# Patient Record
Sex: Female | Born: 1975 | Race: White | Hispanic: No | Smoking: Never smoker
Health system: Southern US, Community
[De-identification: ages and names within clinical notes are randomized; demographics above are authoritative.]

## PROBLEM LIST (undated history)

## (undated) DIAGNOSIS — I672 Cerebral atherosclerosis: Secondary | ICD-10-CM

## (undated) DIAGNOSIS — Z973 Presence of spectacles and contact lenses: Secondary | ICD-10-CM

## (undated) DIAGNOSIS — H40033 Anatomical narrow angle, bilateral: Secondary | ICD-10-CM

## (undated) DIAGNOSIS — O1420 HELLP syndrome (HELLP), unspecified trimester: Secondary | ICD-10-CM

## (undated) DIAGNOSIS — H04123 Dry eye syndrome of bilateral lacrimal glands: Secondary | ICD-10-CM

## (undated) DIAGNOSIS — Z8489 Family history of other specified conditions: Secondary | ICD-10-CM

## (undated) DIAGNOSIS — K219 Gastro-esophageal reflux disease without esophagitis: Secondary | ICD-10-CM

## (undated) DIAGNOSIS — D509 Iron deficiency anemia, unspecified: Secondary | ICD-10-CM

## (undated) DIAGNOSIS — Z8614 Personal history of Methicillin resistant Staphylococcus aureus infection: Secondary | ICD-10-CM

## (undated) DIAGNOSIS — N920 Excessive and frequent menstruation with regular cycle: Secondary | ICD-10-CM

## (undated) DIAGNOSIS — I340 Nonrheumatic mitral (valve) insufficiency: Secondary | ICD-10-CM

## (undated) DIAGNOSIS — Z8759 Personal history of other complications of pregnancy, childbirth and the puerperium: Secondary | ICD-10-CM

## (undated) DIAGNOSIS — E042 Nontoxic multinodular goiter: Secondary | ICD-10-CM

## (undated) DIAGNOSIS — R002 Palpitations: Secondary | ICD-10-CM

## (undated) DIAGNOSIS — I10 Essential (primary) hypertension: Secondary | ICD-10-CM

## (undated) DIAGNOSIS — T8859XA Other complications of anesthesia, initial encounter: Secondary | ICD-10-CM

## (undated) DIAGNOSIS — M1990 Unspecified osteoarthritis, unspecified site: Secondary | ICD-10-CM

## (undated) HISTORY — DX: HELLP syndrome (HELLP), unspecified trimester: O14.20

## (undated) HISTORY — PX: TONSILLECTOMY AND ADENOIDECTOMY: SUR1326

## (undated) HISTORY — PX: DILATION AND CURETTAGE OF UTERUS: SHX78

---
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
## 1997-10-29 ENCOUNTER — Ambulatory Visit (HOSPITAL_COMMUNITY): Admission: RE | Admit: 1997-10-29 | Discharge: 1997-10-29 | Payer: Self-pay | Admitting: Dermatology

## 1998-05-07 ENCOUNTER — Ambulatory Visit (HOSPITAL_COMMUNITY): Admission: RE | Admit: 1998-05-07 | Discharge: 1998-05-07 | Payer: Self-pay | Admitting: Dermatology

## 1998-06-04 ENCOUNTER — Ambulatory Visit (HOSPITAL_COMMUNITY): Admission: RE | Admit: 1998-06-04 | Discharge: 1998-06-04 | Payer: Self-pay | Admitting: Dermatology

## 1998-07-16 ENCOUNTER — Ambulatory Visit (HOSPITAL_COMMUNITY): Admission: RE | Admit: 1998-07-16 | Discharge: 1998-07-16 | Payer: Self-pay | Admitting: Dermatology

## 1998-08-26 ENCOUNTER — Ambulatory Visit (HOSPITAL_COMMUNITY): Admission: RE | Admit: 1998-08-26 | Discharge: 1998-08-26 | Payer: Self-pay | Admitting: Dermatology

## 1998-09-10 ENCOUNTER — Ambulatory Visit (HOSPITAL_COMMUNITY): Admission: RE | Admit: 1998-09-10 | Discharge: 1998-09-10 | Payer: Self-pay | Admitting: Dermatology

## 1998-10-22 ENCOUNTER — Ambulatory Visit (HOSPITAL_COMMUNITY): Admission: RE | Admit: 1998-10-22 | Discharge: 1998-10-22 | Payer: Self-pay | Admitting: Dermatology

## 2008-09-06 HISTORY — PX: DILATION AND CURETTAGE OF UTERUS: SHX78

## 2008-09-10 ENCOUNTER — Emergency Department (HOSPITAL_COMMUNITY): Admission: EM | Admit: 2008-09-10 | Discharge: 2008-09-11 | Payer: Self-pay | Admitting: Emergency Medicine

## 2010-10-19 ENCOUNTER — Encounter (INDEPENDENT_AMBULATORY_CARE_PROVIDER_SITE_OTHER): Payer: Self-pay | Admitting: *Deleted

## 2010-10-19 DIAGNOSIS — F341 Dysthymic disorder: Secondary | ICD-10-CM | POA: Insufficient documentation

## 2010-10-19 DIAGNOSIS — A4902 Methicillin resistant Staphylococcus aureus infection, unspecified site: Secondary | ICD-10-CM | POA: Insufficient documentation

## 2010-10-24 NOTE — Miscellaneous (Signed)
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) Added new problem of ANXIETY DEPRESSION (ICD-300.4) Medications: Added new medication of LABETALOL HCL 300 MG TABS (LABETALOL HCL) Take 1 tablet by mouth two times a day Added new medication of VALTREX 500 MG TABS (VALACYCLOVIR HCL) Take 1 tablet by mouth once a day Added new medication of XOPENEX HFA 45 MCG/ACT AERO (LEVALBUTEROL TARTRATE) as directed Added new medication of FLONASE 50 MCG/ACT SUSP (FLUTICASONE PROPIONATE) as directed Added new medication of SINGULAIR 10 MG TABS (MONTELUKAST SODIUM) as directed Allergies: Added new allergy or adverse reaction of ASA Added new allergy or adverse reaction of * EMYCIN Added new allergy or adverse reaction of * ZPACK Added new allergy or adverse reaction of MACROBID Observations: Added new observation of NKA: F (10/19/2010 16:09)

## 2010-11-22 ENCOUNTER — Encounter: Payer: Self-pay | Admitting: Infectious Diseases

## 2010-11-22 ENCOUNTER — Ambulatory Visit (INDEPENDENT_AMBULATORY_CARE_PROVIDER_SITE_OTHER): Payer: BC Managed Care – PPO | Admitting: Infectious Diseases

## 2010-11-22 VITALS — BP 145/87 | HR 81 | Temp 98.1°F | Ht 64.0 in | Wt 145.4 lb

## 2010-11-22 DIAGNOSIS — A4902 Methicillin resistant Staphylococcus aureus infection, unspecified site: Secondary | ICD-10-CM

## 2010-11-22 DIAGNOSIS — I10 Essential (primary) hypertension: Secondary | ICD-10-CM | POA: Insufficient documentation

## 2010-11-22 DIAGNOSIS — F341 Dysthymic disorder: Secondary | ICD-10-CM

## 2010-11-22 MED ORDER — MUPIROCIN 2 % EX OINT: TOPICAL_OINTMENT | CUTANEOUS | Status: AC

## 2010-11-22 MED ORDER — SULFAMETHOXAZOLE-TRIMETHOPRIM 800-160 MG PO TABS: 1.0000 | ORAL_TABLET | Freq: Two times a day (BID) | ORAL | Status: AC

## 2010-11-22 NOTE — Progress Notes (Signed)
  Subjective:    Patient ID: Caroline Peterson, female    DOB: 27-Oct-1975, 35 y.o.   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 Has not shaved since Jan and has not had recurrence since then. Has had them under her L and her R arms. Have manifest as boils. Boils go away after she takes anbx. Prev on septra. Has been on bactroban intranasally since Jan. Has had nasal cx and lesional cx which were both positive for MRSA.    Review of Systems Breast cancer in granmothers, pt has had prev mammo. Nl BM, nl urination, eating well. Wt steady. Using dial antibacterial soap. Has prev used hibiclens (last time within the last week). huband previously had lesions.     Objective:   Physical Exam  Constitutional: She appears well-developed and well-nourished.  Eyes: EOM are normal. Pupils are equal, round, and reactive to light.  Neck: Normal range of motion. Neck supple.  Cardiovascular: Normal rate, regular rhythm and normal heart sounds.   Pulmonary/Chest: Effort normal and breath sounds normal.  Abdominal: Soft. Bowel sounds are normal.  Musculoskeletal:       Arms:         Assessment & Plan:

## 2010-11-22 NOTE — Assessment & Plan Note (Signed)
She does not have any current lesions. We spoke at length regarding treatment options 1) use bactroban for the first 5 days of the month for the next 6 months. 2) use a good antibacterial soap. She is currently using dial antibactertial.  3) wash clothes, linens in hot water. Bleach would be helpful as well. 4) Bactrim as needed. 5) keep lesions covered.  6) hand washing at home.  It is possible that this is not only MRSA but also Hidradenitis Suppurtiva. If that is the case, will add topical clindamycin.  She will call back when she has a recurrence.

## 2010-11-27 LAB — CBC
HCT: 36.5 % (ref 36.0–46.0)
Hemoglobin: 12.2 g/dL (ref 12.0–15.0)
MCV: 91.2 fL (ref 78.0–100.0)
RBC: 4 MIL/uL (ref 3.87–5.11)
WBC: 7.1 10*3/uL (ref 4.0–10.5)

## 2010-11-27 LAB — URINE CULTURE

## 2010-11-27 LAB — POCT I-STAT, CHEM 8
BUN: 19 mg/dL (ref 6–23)
Calcium, Ion: 1.11 mmol/L — ABNORMAL LOW (ref 1.12–1.32)
Chloride: 104 mEq/L (ref 96–112)
Creatinine, Ser: 0.7 mg/dL (ref 0.4–1.2)
Glucose, Bld: 119 mg/dL — ABNORMAL HIGH (ref 70–99)

## 2010-11-27 LAB — DIFFERENTIAL
Eosinophils Absolute: 0.1 10*3/uL (ref 0.0–0.7)
Lymphs Abs: 1.8 10*3/uL (ref 0.7–4.0)
Monocytes Relative: 7 % (ref 3–12)
Neutrophils Relative %: 66 % (ref 43–77)

## 2010-11-27 LAB — URINALYSIS, ROUTINE W REFLEX MICROSCOPIC
Glucose, UA: NEGATIVE mg/dL
Protein, ur: NEGATIVE mg/dL
Urobilinogen, UA: 0.2 mg/dL (ref 0.0–1.0)

## 2010-11-27 LAB — POCT CARDIAC MARKERS: Troponin i, poc: <0.05 ng/mL (ref 0.00–0.09)

## 2010-11-27 LAB — PROTIME-INR: Prothrombin Time: 12.8 seconds (ref 11.6–15.2)

## 2011-10-16 ENCOUNTER — Other Ambulatory Visit: Payer: Self-pay | Admitting: Family Medicine

## 2011-10-16 MED ORDER — RANITIDINE HCL 150 MG PO TABS: 150.0000 mg | ORAL_TABLET | Freq: Two times a day (BID) | ORAL | Status: DC

## 2012-03-07 ENCOUNTER — Emergency Department (HOSPITAL_COMMUNITY)
Admission: EM | Admit: 2012-03-07 | Discharge: 2012-03-07 | Disposition: A | Discharge status: Home / Self Care | Payer: BC Managed Care – PPO | Attending: Emergency Medicine | Admitting: Emergency Medicine

## 2012-03-07 ENCOUNTER — Emergency Department (HOSPITAL_COMMUNITY): Payer: BC Managed Care – PPO

## 2012-03-07 ENCOUNTER — Encounter (HOSPITAL_COMMUNITY): Payer: Self-pay | Admitting: Cardiology

## 2012-03-07 DIAGNOSIS — M4120 Other idiopathic scoliosis, site unspecified: Secondary | ICD-10-CM | POA: Insufficient documentation

## 2012-03-07 DIAGNOSIS — R002 Palpitations: Secondary | ICD-10-CM

## 2012-03-07 DIAGNOSIS — I10 Essential (primary) hypertension: Secondary | ICD-10-CM | POA: Insufficient documentation

## 2012-03-07 HISTORY — DX: Essential (primary) hypertension: I10

## 2012-03-07 LAB — URINALYSIS, ROUTINE W REFLEX MICROSCOPIC
Bilirubin Urine: NEGATIVE
Hgb urine dipstick: NEGATIVE
Specific Gravity, Urine: 1.009 (ref 1.005–1.030)
Urobilinogen, UA: 0.2 mg/dL (ref 0.0–1.0)
pH: 6.5 (ref 5.0–8.0)

## 2012-03-07 LAB — BASIC METABOLIC PANEL
BUN: 10 mg/dL (ref 6–23)
CO2: 25 mEq/L (ref 19–32)
Chloride: 108 mEq/L (ref 96–112)
Creatinine, Ser: 0.55 mg/dL (ref 0.50–1.10)
Potassium: 3.7 mEq/L (ref 3.5–5.1)

## 2012-03-07 LAB — CBC WITH DIFFERENTIAL/PLATELET
HCT: 35.7 % — ABNORMAL LOW (ref 36.0–46.0)
Hemoglobin: 12.3 g/dL (ref 12.0–15.0)
Lymphocytes Relative: 21 % (ref 12–46)
Monocytes Absolute: 0.4 10*3/uL (ref 0.1–1.0)
Monocytes Relative: 7 % (ref 3–12)
Neutro Abs: 4.1 10*3/uL (ref 1.7–7.7)
RBC: 4.11 MIL/uL (ref 3.87–5.11)
WBC: 5.7 10*3/uL (ref 4.0–10.5)

## 2012-03-07 LAB — POCT PREGNANCY, URINE: Preg Test, Ur: NEGATIVE

## 2012-03-07 LAB — TROPONIN I: Troponin I: <0.3 ng/mL (ref <0.30)

## 2012-03-07 NOTE — ED Notes (Signed)
Pt reports that she had some tightness in her jaw last night and woke up this morning and felt sweaty and was having heart palpations. States that she does not see a cardiologist but feels like she needs to follow up with one. Reports pains under her left ribs.

## 2012-03-07 NOTE — ED Provider Notes (Signed)
History     CSN: [id number]  Arrival date & time 03/07/12  0908   First MD Initiated Contact with Patient 03/07/12 [phone]      Chief Complaint  Patient presents with  . Chest Pain    (Consider location/radiation/quality/duration/timing/severity/associated sxs/prior treatment) HPI Comments: Caroline Peterson is a 36 y.o. Female with intermittent palpitations, for 10 months, which occur at varying times during the day. She was referred to a nephrologist because of her hypertension, and that doctor prescribed Xanax for suspected anxiety, as cause of the symptoms. Last night, she noticed palpitations with chest discomfort, and sweating. She took a Xanax, and went to sleep. Today, she has had recurrence of palpitations, and intermittent left anterior chest discomfort. The pain is mild. There are no aggravating or palliative factors. She's been using her regular medicines, without relief. She has no primary care Dr. Geronimo Running never been formally evaluated for palpitations. She has no associated shortness of breath, back, pain, weakness, or dizziness. Her last menstrual period was 02/20/12. She is trying to get pregnant.  Patient is a 36 y.o. female presenting with chest pain. The history is provided by the patient.  Chest Pain     Past Medical History  Diagnosis Date  . Hypertension     History reviewed. No pertinent past surgical history.  Family History  Problem Relation Age of Onset  . Hypertension Mother   . Hyperlipidemia Mother   . Diabetes Father   . Hyperlipidemia Father   . Hypertension Brother     History  Substance Use Topics  . Smoking status: Never Smoker   . Smokeless tobacco: Never Used  . Alcohol Use: No    OB History    Grav Para Term Preterm Abortions TAB SAB Ect Mult Living                  Review of Systems  Cardiovascular: Positive for chest pain.  All other systems reviewed and are negative.    Allergies  Aspirin; Erythromycin; Nitrofurantoin; and  Azithromycin  Home Medications   Current Outpatient Rx  Name Route Sig Dispense Refill  . ALPRAZOLAM 0.25 MG PO TABS Oral Take 0.25 mg by mouth 2 (two) times daily as needed. As needed for anxiety.    Marland Kitchen LOPERAMIDE HCL 2 MG PO CAPS Oral Take 2 mg by mouth once as needed. As needed for loose stool.    Marland Kitchen LORATADINE 10 MG PO TABS Oral Take 10 mg by mouth daily as needed. As needed for allergies.    Marland Kitchen METOPROLOL SUCCINATE ER 50 MG PO TB24 Oral Take 50 mg by mouth daily.      Marland Kitchen MUPIROCIN 2 % EX OINT Topical Apply 1 application topically as needed. As needed for nose sores.    Marland Kitchen OVER THE COUNTER MEDICATION Oral Take 1 capsule by mouth daily. Acidophilus with probiotics.    Marland Kitchen PRENATAL MULTIVITAMIN CH Oral Take 1 tablet by mouth daily.    Marland Kitchen RANITIDINE HCL 150 MG PO TABS Oral Take 1 tablet (150 mg total) by mouth 2 (two) times daily. 60 tablet 1  . VALACYCLOVIR HCL 500 MG PO TABS Oral Take 500 mg by mouth daily.        BP 128/80  Pulse 71  Temp 98 F (36.7 C) (Oral)  Resp 20  SpO2 100%  LMP 02/20/2012  Physical Exam  Nursing note and vitals reviewed. Constitutional: She is oriented to person, place, and time. She appears well-developed and well-nourished.  HENT:  Head: Normocephalic and atraumatic.  Eyes: Conjunctivae and EOM are normal. Pupils are equal, round, and reactive to light.  Neck: Normal range of motion and phonation normal. Neck supple.  Cardiovascular: Normal rate, regular rhythm and intact distal pulses.   Pulmonary/Chest: Effort normal and breath sounds normal. She exhibits no tenderness.  Abdominal: Soft. She exhibits no distension. There is no tenderness. There is no guarding.  Musculoskeletal: Normal range of motion.  Neurological: She is alert and oriented to person, place, and time. She has normal strength. She exhibits normal muscle tone.  Skin: Skin is warm and dry.  Psychiatric: Her behavior is normal. Judgment and thought content normal.       She appears somewhat  anxious    ED Course  Procedures (including critical care time)    Date: 03/07/2012  Rate: 81  Rhythm: normal sinus rhythm  QRS Axis: normal  Intervals: normal  ST/T Wave abnormalities: nonspecific T wave abnormality  Conduction Disutrbances:none  Narrative Interpretation:   Old EKG Reviewed: unchanged   Labs Reviewed  CBC WITH DIFFERENTIAL - Abnormal; Notable for the following:    HCT 35.7 (*)     All other components within normal limits  BASIC METABOLIC PANEL  TROPONIN I  URINALYSIS, ROUTINE W REFLEX MICROSCOPIC  POCT PREGNANCY, URINE   Dg Chest 2 View  03/07/2012  *RADIOLOGY REPORT*  Clinical Data: Palpitations  CHEST - 2 VIEW  Comparison: 09/11/2008  Findings: Heart size is normal.  No pleural effusion or edema. No airspace consolidation.  There is a scoliosis deformity affecting the thoracic and lumbar spine.  IMPRESSION:  1.  No acute cardiopulmonary abnormalities  Original Report Authenticated By: Rosealee Albee, M.D.     1. Palpitation       MDM  Nonspecific palpitations, without near syncope, or syncope. Doubt ACS, PE (PERC negative), pneumonia, or myocarditis. She is stable for discharge with outpatient management of palpitations.   Plan: Home Medications- usual; Home Treatments- rest, avoid caffeine; Recommended follow up- Cardiology 1-2 weeks for probable Holter monitor.        Flint Melter, MD 03/07/12 [phone]

## 2012-08-28 ENCOUNTER — Telehealth: Payer: Self-pay | Admitting: Cardiology

## 2012-08-28 NOTE — Telephone Encounter (Signed)
ROI Mailed To Pt Home Address  08/28/12/KM

## 2012-09-12 ENCOUNTER — Encounter: Payer: Self-pay | Admitting: Cardiology

## 2012-09-12 ENCOUNTER — Ambulatory Visit (INDEPENDENT_AMBULATORY_CARE_PROVIDER_SITE_OTHER): Payer: BC Managed Care – PPO | Admitting: Cardiology

## 2012-09-12 VITALS — BP 116/86 | HR 79 | Ht 64.0 in | Wt 138.0 lb

## 2012-09-12 DIAGNOSIS — R002 Palpitations: Secondary | ICD-10-CM

## 2012-09-12 NOTE — Patient Instructions (Addendum)
The current medical regimen is effective;  continue present plan and medications.  Follow up in 1 year with Dr Hochrein.  You will receive a letter in the mail 2 months before you are due.  Please call us when you receive this letter to schedule your follow up appointment.  

## 2012-09-12 NOTE — Progress Notes (Signed)
HPI The patient presents as a new patient evaluation. She has a history of tachycardia palpitations. She noticed this after her first full-term pregnancy. 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  Following the pregnancy she had significant hypertension and was treated with labetalol. She eventually saw a nephrologist for her hypertension and was switched from labetalol to metoprolol. She says that more recently she has had fewer palpitations. She will notice some occasionally. She describes skipped heartbeats. Of note at one point last year she had a panic attack as diagnosed in the emergency room. She had some chest discomfort with this and was referred to another cardiologist in town. She reports a 30 day event monitor. She reports having an echocardiogram but was told it was only mild mitral regurgitation which was thought to be normal. Her beta blocker dose was increased at that time. She says that she is active though she doesn't exercise. She works and takes care for her young child. With this she has no significant limitations. She denies any ongoing chest pressure, neck or arm discomfort. She has no PND or orthopnea.  Allergies  Allergen Reactions  . Aspirin Nausea And Vomiting    "can take coated aspirin"  . Erythromycin Nausea And Vomiting  . Nitrofurantoin Other (See Comments)    Unknown   . Azithromycin Nausea And Vomiting and Palpitations    "made my heart speed up"    Current Outpatient Prescriptions  Medication Sig Dispense Refill  . ALPRAZolam (XANAX) 0.25 MG tablet Take 0.25 mg by mouth 2 (two) times daily as needed. As needed for anxiety.      . Lactobacillus (ACIDOPHILUS PROBIOTIC) TABS Take 175 mg by mouth daily.      . metoprolol (TOPROL-XL) 50 MG 24 hr tablet Take 50 mg by mouth daily.        . mupirocin ointment (BACTROBAN) 2 % Apply 1 application topically as needed. As needed for nose sores.      Marland Kitchen OVER THE COUNTER MEDICATION Take 1 capsule by mouth daily.  Acidophilus with probiotics.      . Prenatal Vit-Fe Fumarate-FA (PRENATAL MULTIVITAMIN) TABS Take 1 tablet by mouth daily.      . ranitidine (ZANTAC) 150 MG tablet Take 1 tablet (150 mg total) by mouth 2 (two) times daily.  60 tablet  1  . valACYclovir (VALTREX) 500 MG tablet Take 500 mg by mouth daily.          Past Medical History  Diagnosis Date  . Hypertension   . HELLP (hemolytic anemia/elev liver enzymes/low platelets in pregnancy)     Past Surgical History  Procedure Date  . Cesarean section   . Tonsillectomy and adenoidectomy   . Dilation and curettage of uterus     Family History  Problem Relation Age of Onset  . Hypertension Mother   . Hyperlipidemia Mother   . Diabetes Father   . Hyperlipidemia Father   . Hypertension Brother     History   Social History  . Marital Status: Married    Spouse Name: N/A    Number of Children: 1  . Years of Education: N/A   Occupational History  .     Social History Main Topics  . Smoking status: Never Smoker   . Smokeless tobacco: Never Used  . Alcohol Use: No  . Drug Use: No  . Sexually Active: Yes    Birth Control/ Protection: Other-see comments   Other Topics Concern  . Not on file  Social History Narrative   Lives with husband and daughter.     ROS:  Positive for hearing loss, reflux. Otherwise as stated in the history of present illness and negative for all other systems. 09/12/2012  PHYSICAL EXAM BP 116/86  Pulse 79  Ht 5\' 4"  (1.626 m)  Wt 138 lb (62.596 kg)  BMI 23.69 kg/m2  SpO2 97% GENERAL:  Well appearing HEENT:  Pupils equal round and reactive, fundi not visualized, oral mucosa unremarkable NECK:  No jugular venous distention, waveform within normal limits, carotid upstroke brisk and symmetric, no bruits, no thyromegaly LYMPHATICS:  No cervical, inguinal adenopathy LUNGS:  Clear to auscultation bilaterally BACK:  No CVA tenderness CHEST:  Unremarkable HEART:  PMI not displaced or sustained,S1  and S2 within normal limits, no S3, no S4, no clicks, no rubs, no murmurs ABD:  Flat, positive bowel sounds normal in frequency in pitch, no bruits, no rebound, no guarding, no midline pulsatile mass, no hepatomegaly, no splenomegaly EXT:  2 plus pulses throughout, no edema, no cyanosis no clubbing SKIN:  No rashes no nodules NEURO:  Cranial nerves II through XII grossly intact, motor grossly intact throughout PSYCH:  Cognitively intact, oriented to person place and time  EKG:  Sinus rhythm, rate 79, axis within normal limits, intervals within normal limits, no acute ST-T wave changes.  09/12/2012   ASSESSMENT AND PLAN  Palpitations - We had a long discussion about this. She will continue with current beta blocker. Given the fact that they are not particularly symptomatic at this point no further cardiovascular testing is suggested. She will however let me know if they become worse in the future.  Mitral regurgitation - This was apparently mild by echo and she was told it was possibly a variant of normal. We discussed this physiology. I do not suspect anything clinically significant. I will follow this with repeat physical exams.

## 2012-09-15 ENCOUNTER — Telehealth: Payer: Self-pay | Admitting: Cardiology

## 2012-09-15 NOTE — Telephone Encounter (Signed)
Records rec From [facility] Gave to [place] 09/15/12/KM

## 2012-10-28 ENCOUNTER — Telehealth: Payer: Self-pay | Admitting: Cardiology

## 2012-10-28 DIAGNOSIS — I10 Essential (primary) hypertension: Secondary | ICD-10-CM

## 2012-10-28 MED ORDER — METOPROLOL SUCCINATE ER 50 MG PO TB24: 50.0000 mg | ORAL_TABLET | Freq: Every day | ORAL | Status: DC

## 2012-10-28 NOTE — Telephone Encounter (Signed)
Pt needs refill of metoprolol, [address] [facility]

## 2012-10-31 ENCOUNTER — Other Ambulatory Visit: Payer: Self-pay | Admitting: *Deleted

## 2012-10-31 NOTE — Telephone Encounter (Signed)
Returning call to patient about rx request. Our records indicate her Metoprolol (toprol xl) is 50mg  once daily. She said her last doctor had this medication at 50mg  twice daily. All of our records in Epic show Metoprolol Succ XL 50 mg once daily. Spoke with nurse and was advice to send rx to original Prescriber for refill since we do not have that rx request and it is not documented in our system. Called pharmacy to let them know and they agreed to send rx to original prescriber. Was unable to reach patient about this decision and was unable to leave message due to full mailbox. Pharmacy [facility]) said they will inform patient of this decision.   Micki Riley, CMA

## 2012-11-03 ENCOUNTER — Telehealth: Payer: Self-pay | Admitting: Cardiology

## 2012-11-03 DIAGNOSIS — I10 Essential (primary) hypertension: Secondary | ICD-10-CM

## 2012-11-03 MED ORDER — METOPROLOL SUCCINATE ER 50 MG PO TB24: 50.0000 mg | ORAL_TABLET | Freq: Two times a day (BID) | ORAL | Status: DC

## 2012-11-03 NOTE — Telephone Encounter (Signed)
plz return call to patient at wk # [phone] ext [phone] regarding issues with RX as written

## 2012-11-03 NOTE — Telephone Encounter (Signed)
Pt walked into office  Reports she ha been taking Metoprolol ER 50 mg BID for sometime now and is upset because we refilled the RX for once a day.  Explained to pt that we only have documented that she takes it once a day and will need to obtain an order from Dr Antoine Poche to change it to BID.  Dr Rinaldo Cloud originally rxed for twice a day.  Per Dr Antoine Poche OK to change RX to twice a day

## 2012-11-05 ENCOUNTER — Telehealth: Payer: Self-pay | Admitting: Cardiology

## 2012-11-05 DIAGNOSIS — R002 Palpitations: Secondary | ICD-10-CM

## 2012-11-05 NOTE — Telephone Encounter (Signed)
Per pt - she has been having episodes that had occurred for the past 3 weeks or so where her HR is elevated between 115 -120 bpm when she gets up in the AM.  Once she is ready to go to work and gets in the car her HR has returned to normal around 60 to 70.  She reports she is taking her Metoprolol ER 50 mg twice day about 12 hours apart.  BP has been between 120/80 to 116/60.  When her HR is elevated she feels hot, shakey and is sweating.  She is aware I will forward information to Dr Antoine Poche for review however he may want to see her in the office for further evaluation.  She is aware I will call her back with recommendations.

## 2012-11-05 NOTE — Telephone Encounter (Signed)
New Prob   Pt states when she wakes up in the morning her pulse is between 114-120 (c/o being hot, sweatiness, and shaky when pulse goes up) but then goes back down in the 70s when she starts to get her day started. Concerned and would like to speak to nurse.

## 2012-11-06 NOTE — Telephone Encounter (Signed)
Since I do not have a copy of the monitor that was placed by another cardiology group I would like to get a 48 hour holter to see what these am palpitations are.  She could then have follow up with me.

## 2012-11-07 ENCOUNTER — Telehealth: Payer: Self-pay | Admitting: *Deleted

## 2012-11-07 NOTE — Telephone Encounter (Signed)
Left message for pt that Dr Antoine Poche has ordered 48 hour monitor and she should except a call to have that scheduled along with a follow up appointment with Dr Antoine Poche.  Requested she call back if questions

## 2012-11-07 NOTE — Telephone Encounter (Signed)
Left message to call back  

## 2012-11-07 NOTE — Telephone Encounter (Signed)
Left message for Ms. Borner to schedule monitor/ov,

## 2012-11-12 ENCOUNTER — Encounter (INDEPENDENT_AMBULATORY_CARE_PROVIDER_SITE_OTHER): Payer: BC Managed Care – PPO

## 2012-11-12 ENCOUNTER — Telehealth: Payer: Self-pay | Admitting: *Deleted

## 2012-11-12 DIAGNOSIS — R002 Palpitations: Secondary | ICD-10-CM

## 2012-11-12 NOTE — Telephone Encounter (Signed)
48 hr holter monitor placed on Pt 11/12/12 TK

## 2012-11-17 ENCOUNTER — Ambulatory Visit: Payer: BC Managed Care – PPO | Admitting: Cardiology

## 2012-12-01 ENCOUNTER — Encounter: Payer: Self-pay | Admitting: *Deleted

## 2012-12-18 ENCOUNTER — Telehealth: Payer: Self-pay

## 2012-12-18 NOTE — Telephone Encounter (Signed)
Patient aware of monitor results and appointment was made for 02/18/16 @430  pm

## 2013-02-17 ENCOUNTER — Encounter: Payer: Self-pay | Admitting: Cardiology

## 2013-02-17 ENCOUNTER — Ambulatory Visit (INDEPENDENT_AMBULATORY_CARE_PROVIDER_SITE_OTHER): Payer: BC Managed Care – PPO | Admitting: Cardiology

## 2013-02-17 VITALS — BP 140/86 | HR 82 | Wt 136.0 lb

## 2013-02-17 DIAGNOSIS — I10 Essential (primary) hypertension: Secondary | ICD-10-CM

## 2013-02-17 DIAGNOSIS — R002 Palpitations: Secondary | ICD-10-CM

## 2013-02-17 NOTE — Patient Instructions (Addendum)
The current medical regimen is effective;  continue present plan and medications.  Follow up as needed 

## 2013-02-17 NOTE — Progress Notes (Signed)
   HPI The patient presents for followup of palpitations. She had this earlier this year and she wore a monitor. She had been previously as well.  The Holter monitor demonstrated very rare premature ectopy. She said she had some rapid sustained heart rates for was taking Flexeril at that time and stopped taking this. She said her symptoms that resolved. She has since felt well and in fact on vacation recently doesn't recall having any palpitations at all.  Allergies  Allergen Reactions  . Aspirin Nausea And Vomiting    "can take coated aspirin"  . Erythromycin Nausea And Vomiting  . Nitrofurantoin Other (See Comments)    Unknown   . Azithromycin Nausea And Vomiting and Palpitations    "made my heart speed up"    Current Outpatient Prescriptions  Medication Sig Dispense Refill  . aspirin 81 MG tablet Take 81 mg by mouth daily.      . Lactobacillus (ACIDOPHILUS PROBIOTIC) TABS Take 175 mg by mouth daily.      . metoprolol succinate (TOPROL-XL) 50 MG 24 hr tablet Take 1 tablet (50 mg total) by mouth 2 (two) times daily.  60 tablet  11  . mupirocin ointment (BACTROBAN) 2 % Apply 1 application topically as needed. As needed for nose sores.      . Prenatal Vit-Fe Fumarate-FA (PRENATAL MULTIVITAMIN) TABS Take 1 tablet by mouth daily.      . valACYclovir (VALTREX) 500 MG tablet Take 500 mg by mouth daily.         No current facility-administered medications for this visit.    Past Medical History  Diagnosis Date  . Hypertension   . HELLP (hemolytic anemia/elev liver enzymes/low platelets in pregnancy)     Past Surgical History  Procedure Laterality Date  . Cesarean section    . Tonsillectomy and adenoidectomy    . Dilation and curettage of uterus      ROS:  As stated in the history of present illness and negative for all other systems. 02/17/2013  PHYSICAL EXAM BP 140/86  Pulse 82  Wt 136 lb (61.689 kg)  BMI 23.33 kg/m2 GENERAL:  Well appearing NECK:  No jugular venous  distention, waveform within normal limits, carotid upstroke brisk and symmetric, no bruits, no thyromegaly LUNGS:  Clear to auscultation bilaterally HEART:  PMI not displaced or sustained,S1 and S2 within normal limits, no S3, no S4, no clicks, no rubs, no murmurs ABD:  Flat, positive bowel sounds normal in frequency in pitch, no bruits, no rebound, no guarding, no midline pulsatile mass, no hepatomegaly, no splenomegaly EXT:  2 plus pulses throughout, no edema, no cyanosis no clubbing   EKG:  Sinus rhythm, rate 82, axis within normal limits, intervals within normal limits, no acute ST-T wave changes.  02/17/2013   ASSESSMENT AND PLAN  Palpitations - We reviewed the Holter that she wore earlier this year. She had rare palpitations. Given this no further cardiovascular testing is suggested.  We discussed possible when necessary dosing of beta blockers and she will let me know if her symptoms worsen in the future.  Mitral regurgitation - This was apparently mild by echo and she was told it was possibly a variant of normal. We discussed this physiology. I do not suspect anything clinically significant. I will follow this with repeat physical exams.

## 2013-08-25 ENCOUNTER — Other Ambulatory Visit: Payer: Self-pay | Admitting: Otolaryngology

## 2013-08-25 DIAGNOSIS — R599 Enlarged lymph nodes, unspecified: Secondary | ICD-10-CM

## 2013-08-25 DIAGNOSIS — D34 Benign neoplasm of thyroid gland: Secondary | ICD-10-CM

## 2013-09-01 ENCOUNTER — Ambulatory Visit
Admission: RE | Admit: 2013-09-01 | Discharge: 2013-09-01 | Disposition: A | Discharge status: Home / Self Care | Payer: BC Managed Care – PPO | Source: Ambulatory Visit | Attending: Otolaryngology | Admitting: Otolaryngology

## 2013-09-01 DIAGNOSIS — R599 Enlarged lymph nodes, unspecified: Secondary | ICD-10-CM

## 2013-09-01 DIAGNOSIS — D34 Benign neoplasm of thyroid gland: Secondary | ICD-10-CM

## 2013-10-29 ENCOUNTER — Ambulatory Visit (INDEPENDENT_AMBULATORY_CARE_PROVIDER_SITE_OTHER): Payer: BC Managed Care – PPO | Admitting: Cardiology

## 2013-10-29 ENCOUNTER — Encounter: Payer: Self-pay | Admitting: Cardiology

## 2013-10-29 VITALS — BP 142/89 | HR 76 | Wt 138.4 lb

## 2013-10-29 DIAGNOSIS — R002 Palpitations: Secondary | ICD-10-CM

## 2013-10-29 NOTE — Patient Instructions (Signed)
The current medical regimen is effective;  continue present plan and medications.  Follow up in 1 year with Dr Hochrein.  You will receive a letter in the mail 2 months before you are due.  Please call us when you receive this letter to schedule your follow up appointment.  

## 2013-10-29 NOTE — Progress Notes (Signed)
    HPI The patient presents for followup of palpitations. Since I last saw her she has done well.  The patient denies any new symptoms such as chest discomfort, neck or arm discomfort. There has been no new shortness of breath, PND or orthopnea. There has been no reported, presyncope or syncope.  She does at times have palpitations but these are less noticeable than previous.  She takes care of her daughter and works full time.  With this she has had no significant limitations.    Allergies  Allergen Reactions  . Aspirin Nausea And Vomiting    "can take coated aspirin"  . Erythromycin Nausea And Vomiting  . Nitrofurantoin Other (See Comments)    Unknown   . Azithromycin Nausea And Vomiting and Palpitations    "made my heart speed up"    Current Outpatient Prescriptions  Medication Sig Dispense Refill  . Lactobacillus (ACIDOPHILUS PROBIOTIC) TABS Take 175 mg by mouth daily.      . metoprolol succinate (TOPROL-XL) 50 MG 24 hr tablet Take 1 tablet (50 mg total) by mouth 2 (two) times daily.  60 tablet  11  . mupirocin ointment (BACTROBAN) 2 % Apply 1 application topically as needed. As needed for nose sores.      . Prenatal Vit-Fe Fumarate-FA (PRENATAL MULTIVITAMIN) TABS Take 1 tablet by mouth daily.      . valACYclovir (VALTREX) 500 MG tablet Take 500 mg by mouth daily.         No current facility-administered medications for this visit.    Past Medical History  Diagnosis Date  . Hypertension   . HELLP (hemolytic anemia/elev liver enzymes/low platelets in pregnancy)     ROS:  As stated in the history of present illness and negative for all other systems. 10/29/2013  PHYSICAL EXAM BP 142/89  Pulse 76  Wt 138 lb 6.4 oz (62.778 kg) GENERAL:  Well appearing NECK:  No jugular venous distention, waveform within normal limits, carotid upstroke brisk and symmetric, no bruits, positive thyromegaly LUNGS:  Clear to auscultation bilaterally HEART:  PMI not displaced or sustained,S1 and  S2 within normal limits, no S3, no S4, no clicks, no rubs, no murmurs ABD:  Flat, positive bowel sounds normal in frequency in pitch, no bruits, no rebound, no guarding, no midline pulsatile mass, no hepatomegaly, no splenomegaly EXT:  2 plus pulses throughout, no edema, no cyanosis no clubbing   EKG:  Sinus rhythm, rate 76, rigthward axis, intervals within normal limits, no acute ST-T wave changes.  10/29/2013   ASSESSMENT AND PLAN  Palpitations - These are rare and we discussed possible prn dosing of her beta blocker.  No further testing is indicated.    Mitral regurgitation - This was apparently mild by echo and she was told it was possibly a variant of normal.  I do not appreciate a murmur.  No change in therapy is indicated.

## 2013-11-17 ENCOUNTER — Other Ambulatory Visit: Payer: Self-pay | Admitting: *Deleted

## 2013-11-17 DIAGNOSIS — I10 Essential (primary) hypertension: Secondary | ICD-10-CM

## 2013-11-17 MED ORDER — METOPROLOL SUCCINATE ER 50 MG PO TB24: 50.0000 mg | ORAL_TABLET | Freq: Two times a day (BID) | ORAL | Status: DC

## 2014-09-30 ENCOUNTER — Telehealth: Payer: Self-pay | Admitting: Cardiology

## 2014-10-04 NOTE — Telephone Encounter (Signed)
Closed encounter °

## 2014-11-02 ENCOUNTER — Other Ambulatory Visit: Payer: Self-pay | Admitting: Cardiology

## 2014-11-05 ENCOUNTER — Other Ambulatory Visit: Payer: Self-pay | Admitting: Cardiology

## 2014-11-05 NOTE — Telephone Encounter (Signed)
E-sent to pharmacy. Refill X 3 Patient has an appointment on 11/18/14

## 2014-11-16 ENCOUNTER — Ambulatory Visit: Payer: BC Managed Care – PPO | Admitting: Cardiology

## 2014-11-18 ENCOUNTER — Ambulatory Visit (INDEPENDENT_AMBULATORY_CARE_PROVIDER_SITE_OTHER): Payer: BC Managed Care – PPO | Admitting: Cardiology

## 2014-11-18 ENCOUNTER — Encounter: Payer: Self-pay | Admitting: Cardiology

## 2014-11-18 VITALS — BP 108/82 | HR 72 | Ht 64.0 in | Wt 139.8 lb

## 2014-11-18 DIAGNOSIS — R002 Palpitations: Secondary | ICD-10-CM

## 2014-11-18 NOTE — Patient Instructions (Signed)
Dr Percival Spanish recommends that you schedule a follow-up appointment in 18 months. You will receive a reminder letter in the mail two months in advance. If you don't receive a letter, please call our office to schedule the follow-up appointment.

## 2014-11-18 NOTE — Progress Notes (Signed)
    HPI The patient presents for followup of palpitations. Since I last saw her she has done well.  The patient denies any new symptoms such as chest discomfort, neck or arm discomfort. There has been no new shortness of breath, PND or orthopnea. There has been no reported, presyncope or syncope.  She does at have mild palpitations but these are not changed.  She takes care of her daughter 40 year old daughter.  She has had some Gyn problems and is now on hormone therapy.  She does report some mild edema at the end of the day.    Allergies  Allergen Reactions  . Aspirin Nausea And Vomiting    "can take coated aspirin"  . Erythromycin Nausea And Vomiting  . Nitrofurantoin Other (See Comments)    Unknown   . Azithromycin Nausea And Vomiting and Palpitations    "made my heart speed up"    Current Outpatient Prescriptions  Medication Sig Dispense Refill  . Lactobacillus (ACIDOPHILUS PROBIOTIC) TABS Take 175 mg by mouth daily.    . metoprolol succinate (TOPROL-XL) 50 MG 24 hr tablet TAKE 1 TABLET BY MOUTH 2 TIMES DAILY. 60 tablet 3  . norethindrone (MICRONOR,CAMILA,ERRIN) 0.35 MG tablet Take 1 tablet by mouth daily.  3  . valACYclovir (VALTREX) 500 MG tablet Take 500 mg by mouth daily.       No current facility-administered medications for this visit.    Past Medical History  Diagnosis Date  . Hypertension   . HELLP (hemolytic anemia/elev liver enzymes/low platelets in pregnancy)     ROS:  "Heartburn for which she takes Prilosec rarely."  Otherwise as stated in the history of present illness and negative for all other systems. 11/18/2014  PHYSICAL EXAM BP 108/82 mmHg  Pulse 72  Ht 5\' 4"  (1.626 m)  Wt 139 lb 12.8 oz (63.413 kg)  BMI 23.98 kg/m2 GENERAL:  Well appearing NECK:  No jugular venous distention, waveform within normal limits, carotid upstroke brisk and symmetric, no bruits, positive thyromegaly LUNGS:  Clear to auscultation bilaterally HEART:  PMI not displaced or  sustained,S1 and S2 within normal limits, no S3, no S4, no clicks, no rubs, no murmurs ABD:  Flat, positive bowel sounds normal in frequency in pitch, no bruits, no rebound, no guarding, no midline pulsatile mass, no hepatomegaly, no splenomegaly EXT:  2 plus pulses throughout, trace edema, no cyanosis no clubbing   EKG:  Sinus rhythm, rate 72, rigthward axis, intervals within normal limits, no acute ST-T wave changes.  11/18/2014   ASSESSMENT AND PLAN  Palpitations - These are rare.  She will continue the beta blocker  Mitral regurgitation - This was apparently mild by echo and she was told it was possibly a variant of normal.  I do not appreciate a murmur.  No change in therapy is indicated.   Edema - This is mild.  We discussed conservative therapy.

## 2015-01-04 ENCOUNTER — Telehealth: Payer: Self-pay | Admitting: Cardiology

## 2015-01-04 NOTE — Telephone Encounter (Signed)
Pt. Informed that it was ok to take prednisone per Dr. Percival Spanish

## 2015-01-04 NOTE — Telephone Encounter (Signed)
PCP put pt. On prednisone and she wants to know if it is ok for to take this, I told her there should not be a problem but i would send you a message

## 2015-01-04 NOTE — Telephone Encounter (Signed)
New message  Pt c/o medication issue:  1. Name of Medication: Prednisone  2. How are you currently taking this medication (dosage and times per day)? Pt is anticipating taking the medication this morning. 10 mg  3. Are you having a reaction (difficulty breathing--STAT)? No   4. What is your medication issue?   Comments: Pt called states that she has bronchitis pt states that primary care placed her on prednisone. Pt req a call back to determine if it is ok to take.

## 2015-01-04 NOTE — Telephone Encounter (Signed)
OK to take 

## 2015-08-14 HISTORY — PX: BREAST CYST ASPIRATION: SHX578

## 2015-11-01 ENCOUNTER — Telehealth: Payer: Self-pay

## 2015-11-01 NOTE — Telephone Encounter (Signed)
Prior auth for Metoprolol xl 50 mg bid sent too [facility].

## 2015-11-02 ENCOUNTER — Other Ambulatory Visit: Payer: Self-pay | Admitting: Obstetrics and Gynecology

## 2015-11-02 DIAGNOSIS — N63 Unspecified lump in unspecified breast: Secondary | ICD-10-CM

## 2015-11-08 ENCOUNTER — Encounter (INDEPENDENT_AMBULATORY_CARE_PROVIDER_SITE_OTHER): Payer: Self-pay

## 2015-11-08 ENCOUNTER — Other Ambulatory Visit: Payer: Self-pay | Admitting: Obstetrics and Gynecology

## 2015-11-08 ENCOUNTER — Ambulatory Visit
Admission: RE | Admit: 2015-11-08 | Discharge: 2015-11-08 | Disposition: A | Discharge status: Home / Self Care | Payer: BC Managed Care – PPO | Source: Ambulatory Visit | Attending: Obstetrics and Gynecology | Admitting: Obstetrics and Gynecology

## 2015-11-08 DIAGNOSIS — N63 Unspecified lump in unspecified breast: Secondary | ICD-10-CM

## 2015-11-14 ENCOUNTER — Ambulatory Visit
Admission: RE | Admit: 2015-11-14 | Discharge: 2015-11-14 | Disposition: A | Discharge status: Home / Self Care | Payer: BC Managed Care – PPO | Source: Ambulatory Visit | Attending: Obstetrics and Gynecology | Admitting: Obstetrics and Gynecology

## 2015-11-14 DIAGNOSIS — N63 Unspecified lump in unspecified breast: Secondary | ICD-10-CM

## 2015-12-02 ENCOUNTER — Other Ambulatory Visit: Payer: Self-pay

## 2015-12-02 MED ORDER — METOPROLOL SUCCINATE ER 50 MG PO TB24: ORAL_TABLET | ORAL | Status: DC

## 2015-12-08 ENCOUNTER — Telehealth: Payer: Self-pay | Admitting: Cardiology

## 2015-12-08 MED ORDER — METOPROLOL SUCCINATE ER 50 MG PO TB24: ORAL_TABLET | ORAL | Status: DC

## 2015-12-08 NOTE — Telephone Encounter (Signed)
Refill sent to [place] [facility] on [address], at pt request, patient aware.

## 2015-12-08 NOTE — Telephone Encounter (Signed)
°*  STAT* If patient is at the pharmacy, call can be transferred to refill team.( [place])   1. Which medications need to be refilled? (please list name of each medication and dose if known) Metoprolol Succinate   2. Which pharmacy/location (including street and city if local pharmacy) is medication to be sent to? [facility] in [place], [place] [phone]- PHONE 3. Do they need a 30 day or 90 day supply? [place]

## 2016-01-05 ENCOUNTER — Other Ambulatory Visit: Payer: Self-pay | Admitting: *Deleted

## 2016-01-05 MED ORDER — METOPROLOL SUCCINATE ER 50 MG PO TB24: 50.0000 mg | ORAL_TABLET | Freq: Two times a day (BID) | ORAL | Status: DC

## 2016-01-05 NOTE — Telephone Encounter (Signed)
Rx(s) sent to pharmacy electronically.  

## 2016-05-30 ENCOUNTER — Other Ambulatory Visit: Payer: Self-pay | Admitting: *Deleted

## 2016-05-31 MED ORDER — METOPROLOL SUCCINATE ER 50 MG PO TB24: 50.0000 mg | ORAL_TABLET | Freq: Two times a day (BID) | ORAL | 0 refills | Status: DC

## 2016-05-31 NOTE — Telephone Encounter (Signed)
REFILL 

## 2016-06-25 ENCOUNTER — Other Ambulatory Visit: Payer: Self-pay

## 2016-06-25 MED ORDER — METOPROLOL SUCCINATE ER 50 MG PO TB24: 50.0000 mg | ORAL_TABLET | Freq: Two times a day (BID) | ORAL | 0 refills | Status: DC

## 2016-07-04 ENCOUNTER — Encounter: Payer: Self-pay | Admitting: *Deleted

## 2016-07-08 NOTE — Progress Notes (Signed)
    HPI The patient presents for followup of palpitations.   Since I last saw her she has done well.  The patient denies any new symptoms such as chest discomfort, neck or arm discomfort. There has been no new shortness of breath, PND or orthopnea. There has been no reported, presyncope or syncope.  She does at have mild palpitations but these are not changed.  She takes care of her daughter who is now 41 years old      Allergies  Allergen Reactions  . Aspirin Nausea And Vomiting    "can take coated aspirin"  . Erythromycin Nausea And Vomiting  . Nitrofurantoin Other (See Comments)    Unknown   . Azithromycin Nausea And Vomiting and Palpitations    "made my heart speed up"    Current Outpatient Prescriptions  Medication Sig Dispense Refill  . Lactobacillus (ACIDOPHILUS PROBIOTIC) TABS Take 175 mg by mouth daily.    . metoprolol succinate (TOPROL-XL) 50 MG 24 hr tablet Take 1 tablet (50 mg total) by mouth 2 (two) times daily. KEEP OV. 60 tablet 0  . norethindrone (MICRONOR,CAMILA,ERRIN) 0.35 MG tablet Take 1 tablet by mouth daily.  3  . valACYclovir (VALTREX) 500 MG tablet Take 500 mg by mouth daily.       No current facility-administered medications for this visit.     Past Medical History:  Diagnosis Date  . HELLP (hemolytic anemia/elev liver enzymes/low platelets in pregnancy)   . Hypertension     ROS:  As stated in the history of present illness and negative for all other systems. 07/10/2016  PHYSICAL EXAM BP 129/81   Pulse 67   Ht 5\' 4"  (1.626 m)   Wt 126 lb 6.4 oz (57.3 kg)   BMI 21.70 kg/m  GENERAL:  Well appearing NECK:  No jugular venous distention, waveform within normal limits, carotid upstroke brisk and symmetric, no bruits, positive thyromegaly LUNGS:  Clear to auscultation bilaterally HEART:  PMI not displaced or sustained,S1 and S2 within normal limits, no S3, no S4, no clicks, no rubs, no murmurs ABD:  Flat, positive bowel sounds normal in frequency in  pitch, no bruits, no rebound, no guarding, no midline pulsatile mass, no hepatomegaly, no splenomegaly EXT:  2 plus pulses throughout, trace edema, no cyanosis no clubbing   EKG:  Sinus rhythm, rate 67, rigthward axis, intervals within normal limits, no acute ST-T wave changes.  07/10/2016   ASSESSMENT AND PLAN  Palpitations - These are rare.  She will continue the beta blocker.    Mitral regurgitation - This was apparently mild by echo and she was told it was possibly a variant of normal.  I do not appreciate a murmur.  No change in therapy is indicated.

## 2016-07-10 ENCOUNTER — Ambulatory Visit (INDEPENDENT_AMBULATORY_CARE_PROVIDER_SITE_OTHER): Payer: BC Managed Care – PPO | Admitting: Cardiology

## 2016-07-10 ENCOUNTER — Encounter: Payer: Self-pay | Admitting: Cardiology

## 2016-07-10 VITALS — BP 129/81 | HR 67 | Ht 64.0 in | Wt 126.4 lb

## 2016-07-10 DIAGNOSIS — R002 Palpitations: Secondary | ICD-10-CM

## 2016-07-10 NOTE — Patient Instructions (Signed)
Medication Instructions:  Continue current medications  Labwork: None ordered  Testing/Procedures: None ordered  Follow-Up: Your physician wants you to follow-up in: 1 Year. You will receive a reminder letter in the mail two months in advance. If you don't receive a letter, please call our office to schedule the follow-up appointment.   Any Other Special Instructions Will Be Listed Below (If Applicable).     If you need a refill on your cardiac medications before your next appointment, please call your pharmacy.

## 2016-07-31 ENCOUNTER — Other Ambulatory Visit: Payer: Self-pay | Admitting: Cardiology

## 2016-08-01 NOTE — Telephone Encounter (Signed)
Rx(s) sent to pharmacy electronically.  

## 2016-08-13 HISTORY — PX: EYE SURGERY: SHX253

## 2017-06-24 ENCOUNTER — Other Ambulatory Visit: Payer: Self-pay | Admitting: Cardiology

## 2017-07-09 NOTE — Progress Notes (Signed)
     HPI The patient presents for followup of palpitations.   Since I last saw her she has been doing well.  She works full time as a Animal nutritionist.  She takes care of her 42-year-old daughter.  Her husband is soon to be deployed to [place].  She denies any cardiovascular symptoms.  She is not noticing any palpitations, presyncope or syncope.  She has no shortness of breath, PND or orthopnea.  Allergies  Allergen Reactions  . Aspirin Nausea And Vomiting    "can take coated aspirin"  . Erythromycin Nausea And Vomiting  . Nitrofurantoin Other (See Comments)    Unknown   . Azithromycin Nausea And Vomiting and Palpitations    "made my heart speed up"    Current Outpatient Medications  Medication Sig Dispense Refill  . amoxicillin (AMOXIL) 875 MG tablet Take 875 mg by mouth 2 (two) times daily.    . fexofenadine (ALLEGRA) 30 MG tablet Take 30 mg by mouth daily.    . fluticasone (FLONASE) 50 MCG/ACT nasal spray Place 1 spray into both nostrils daily.    Marland Kitchen HYDROcodone-homatropine (HYCODAN) 5-1.5 MG/5ML syrup Take 5 mLs by mouth every 6 (six) hours as needed for cough.    . Lactobacillus (ACIDOPHILUS PROBIOTIC) TABS Take 175 mg by mouth daily.    . metoprolol succinate (TOPROL-XL) 50 MG 24 hr tablet TAKE 1 TABLET BY MOUTH TWICE DAILY 60 tablet 0  . valACYclovir (VALTREX) 500 MG tablet Take 500 mg by mouth daily.       No current facility-administered medications for this visit.     Past Medical History:  Diagnosis Date  . HELLP (hemolytic anemia/elev liver enzymes/low platelets in pregnancy)   . Hypertension     ROS:  As stated in the HPI and negative for all other systems.   PHYSICAL EXAM BP 112/82   Pulse 65   Ht 5\' 4"  (1.626 m)   Wt 130 lb 12.8 oz (59.3 kg)   BMI 22.45 kg/m   GENERAL:  Well appearing NECK:  No jugular venous distention, waveform within normal limits, carotid upstroke brisk and symmetric, no bruits, no thyromegaly LUNGS:  Clear to auscultation  bilaterally CHEST:  Unremarkable HEART:  PMI not displaced or sustained,S1 and S2 within normal limits, no S3, no S4, no clicks, no rubs, no murmurs ABD:  Flat, positive bowel sounds normal in frequency in pitch, no bruits, no rebound, no guarding, no midline pulsatile mass, no hepatomegaly, no splenomegaly EXT:  2 plus pulses throughout, no edema, no cyanosis no clubbing   EKG:  Sinus rhythm, rate 65 , rigthward axis, intervals within normal limits, no acute ST-T wave changes.  07/10/2017   ASSESSMENT AND PLAN  Palpitations - These are not bothering her n the beta blocker. Her meds will think No change in therapy is indicated.   Mitral regurgitation - I do not hear any MR.  No further imaging is indicated.

## 2017-07-10 ENCOUNTER — Encounter: Payer: Self-pay | Admitting: Cardiology

## 2017-07-10 ENCOUNTER — Ambulatory Visit (INDEPENDENT_AMBULATORY_CARE_PROVIDER_SITE_OTHER): Payer: BC Managed Care – PPO | Admitting: Cardiology

## 2017-07-10 VITALS — BP 112/82 | HR 65 | Ht 64.0 in | Wt 130.8 lb

## 2017-07-10 DIAGNOSIS — R002 Palpitations: Secondary | ICD-10-CM

## 2017-07-10 MED ORDER — METOPROLOL SUCCINATE ER 50 MG PO TB24: 50.0000 mg | ORAL_TABLET | Freq: Two times a day (BID) | ORAL | 3 refills | Status: DC

## 2017-07-10 NOTE — Patient Instructions (Signed)
Medication Instructions:  Continue current medications  If you need a refill on your cardiac medications before your next appointment, please call your pharmacy.  Labwork: None Ordered   Testing/Procedures: None Ordered  Follow-Up: Your physician wants you to follow-up in: 1 Year. You should receive a reminder letter in the mail two months in advance. If you do not receive a letter, please call our office [phone].    Thank you for choosing [facility]!!      

## 2017-08-13 HISTORY — PX: EYE SURGERY: SHX253

## 2017-09-09 DIAGNOSIS — H25812 Combined forms of age-related cataract, left eye: Secondary | ICD-10-CM | POA: Insufficient documentation

## 2017-09-09 DIAGNOSIS — H40001 Preglaucoma, unspecified, right eye: Secondary | ICD-10-CM | POA: Insufficient documentation

## 2017-09-09 DIAGNOSIS — H25811 Combined forms of age-related cataract, right eye: Secondary | ICD-10-CM | POA: Insufficient documentation

## 2017-09-09 DIAGNOSIS — H4020X0 Unspecified primary angle-closure glaucoma, stage unspecified: Secondary | ICD-10-CM | POA: Insufficient documentation

## 2017-10-04 DIAGNOSIS — H40033 Anatomical narrow angle, bilateral: Secondary | ICD-10-CM | POA: Insufficient documentation

## 2018-01-01 ENCOUNTER — Encounter (HOSPITAL_COMMUNITY): Payer: Self-pay | Admitting: Family Medicine

## 2018-01-01 ENCOUNTER — Ambulatory Visit (HOSPITAL_COMMUNITY)
Admission: EM | Admit: 2018-01-01 | Discharge: 2018-01-01 | Disposition: A | Discharge status: Home / Self Care | Payer: Federal, State, Local not specified - PPO | Attending: Family Medicine | Admitting: Family Medicine

## 2018-01-01 DIAGNOSIS — F410 Panic disorder [episodic paroxysmal anxiety] without agoraphobia: Secondary | ICD-10-CM

## 2018-01-01 DIAGNOSIS — B001 Herpesviral vesicular dermatitis: Secondary | ICD-10-CM

## 2018-01-01 DIAGNOSIS — B349 Viral infection, unspecified: Secondary | ICD-10-CM

## 2018-01-01 HISTORY — DX: Anatomical narrow angle, bilateral: H40.033

## 2018-01-01 MED ORDER — LORAZEPAM 1 MG PO TABS: 1.0000 mg | ORAL_TABLET | Freq: Three times a day (TID) | ORAL | 0 refills | Status: DC

## 2018-01-01 MED ORDER — VALACYCLOVIR HCL 1 G PO TABS: ORAL_TABLET | ORAL | 0 refills | Status: DC

## 2018-01-01 MED ORDER — HYDROCODONE-HOMATROPINE 5-1.5 MG/5ML PO SYRP: 5.0000 mL | ORAL_SOLUTION | Freq: Four times a day (QID) | ORAL | 0 refills | Status: DC | PRN

## 2018-01-01 NOTE — ED Provider Notes (Signed)
[place]   [id number] 01/01/18 Arrival Time: 1000  ASSESSMENT & PLAN:  1. Panic attack   2. Viral illness   3. Herpes labialis     Meds ordered this encounter  Medications  . HYDROcodone-homatropine (HYCODAN) 5-1.5 MG/5ML syrup    Sig: Take 5 mLs by mouth every 6 (six) hours as needed for cough.    Dispense:  90 mL    Refill:  0  . LORazepam (ATIVAN) 1 MG tablet    Sig: Take 1 tablet (1 mg total) by mouth every 8 (eight) hours.    Dispense:  12 tablet    Refill:  0  . valACYclovir (VALTREX) 1000 MG tablet    Sig: Take 2 tablets by mouth twice daily for one day.    Dispense:  12 tablet    Refill:  0   Medication sedation precautions.  Follow-up Information    Lillard Anes, MD.   Specialty:  Family Medicine Why:  If symptoms worsen. Contact information: [address] [place] [address] [phone]          Reviewed expectations re: course of current medical issues. Questions answered. Outlined signs and symptoms indicating need for more acute intervention. Patient verbalized understanding. After Visit Summary given.   SUBJECTIVE:  Caroline Peterson is a 42 y.o. female who presents with complaint of:  Patient complains of panic attacks. H/O similar in the past. Current episodes over the past 3-4 days. Situational anxiety. Reports her husband will be on [place] deployment soon and this is making her anxious. Does report transiet feelings of CP along with SOB. Able to breathe deeply and symptoms gradually resolve. Overall trouble sleeping secondary to anxiety. She denies current suicidal and homicidal ideation. Reports no previous visits to a healthcare provider for anxiety or depression. Is crying more than usual.  Also reports general body aches, nasal congestion, coughing, chills, and subjective fever for 3-4 days. "Feel worn out." No sore throat, nausea, or vomiting. But overall decreased PO intake. Coughing is affecting  sleep. OTC Allegra with mild help.  About 2 days ago reports cold sores of upper lip. Takes Valtrex daily but requests Rx for extra tablets since she will need to increase daily dose for the next 24 hours.  ROS: As per HPI. All other systems negative.    OBJECTIVE:  Vitals:   01/01/18 1023  BP: 111/74  Pulse: (!) 105  Resp: 18  Temp: 99 F (37.2 C)  SpO2: 97%    General appearance: alert; no distress Eyes: PERRLA; EOMI; conjunctiva normal HENT: normocephalic; atraumatic Neck: supple Lungs: clear to auscultation bilaterally; dry cough Heart: regular; slight tachycardia Abdomen: soft, non-tender  Extremities: no cyanosis or edema; symmetrical with no gross deformities Skin: warm and dry; upper lip with herpes labialis Neurologic: normal gait; normal symmetric reflexes Psychological: alert and cooperative; does appear somewhat anxious    Allergies  Allergen Reactions  . Aspirin Nausea And Vomiting    "can take coated aspirin"  . Erythromycin Nausea And Vomiting  . Nitrofurantoin Other (See Comments)    Unknown   . Azithromycin Nausea And Vomiting and Palpitations    "made my heart speed up"    Past Medical History:  Diagnosis Date  . HELLP (hemolytic anemia/elev liver enzymes/low platelets in pregnancy)   . Hypertension   . 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 Marital status: Married    Spouse name:  Not on file  . Number of children: 1  . Years of education: Not on file  . Highest education level: Not on file  Occupational History    Employer: [employer]  Social Needs  . Financial resource strain: Not on file  . Food insecurity:    Worry: Not on file    Inability: Not on file  . Transportation needs:    Medical: Not on file    Non-medical: Not on file  Tobacco Use  . Smoking status: Never Smoker  . Smokeless tobacco: Never Used  Substance and Sexual Activity  . Alcohol use: No  . Drug  use: No  . Sexual activity: Yes    Birth control/protection: Other-see comments  Lifestyle  . Physical activity:    Days per week: Not on file    Minutes per session: Not on file  . Stress: Not on file  Relationships  . Social connections:    Talks on phone: Not on file    Gets together: Not on file    Attends religious service: Not on file    Active member of club or organization: Not on file    Attends meetings of clubs or organizations: Not on file    Relationship status: Not on file  . Intimate partner violence:    Fear of current or ex partner: Not on file    Emotionally abused: Not on file    Physically abused: Not on file    Forced sexual activity: Not on file  Other Topics Concern  . Not on file  Social History Narrative   Lives with husband and daughter.    Family History  Problem Relation Age of Onset  . Hypertension Mother   . Hyperlipidemia Mother   . Diabetes Father   . Hyperlipidemia Father   . Hypertension Brother    Past Surgical History:  Procedure Laterality Date  . CESAREAN SECTION    . DILATION AND CURETTAGE OF UTERUS    . TONSILLECTOMY AND ADENOIDECTOMY        Vanessa Kick, MD 01/08/18 [phone]

## 2018-01-01 NOTE — ED Triage Notes (Addendum)
Pt here for anxiety and panic attacks. She reports that her husband is about to be deployed and she has been very anxious. She is also having a fever,  body aches, cough and fever. sts fatigued and fever blisters.

## 2018-01-01 NOTE — Discharge Instructions (Signed)
Be aware, cough and anxiety medications may cause drowsiness. Please do not drive, operate heavy machinery or make important decisions while on this medication, it can cloud your judgement.

## 2018-07-23 ENCOUNTER — Encounter: Payer: Self-pay | Admitting: Cardiology

## 2018-07-25 ENCOUNTER — Other Ambulatory Visit: Payer: Self-pay | Admitting: Cardiology

## 2018-07-30 NOTE — Progress Notes (Signed)
     HPI The patient presents for followup of palpitations.   Since I last saw her her husband was deployed to [place].  She is raising her 43-year-old daughter.  There have been illnesses in her family.  She is working full-time as a Animal nutritionist.  With all of this she feels palpitations but she says it is a stable pattern.  He is not having any new presyncope or syncope.  She is not having any new chest pressure, neck or arm discomfort.  She has no weight gain or edema.   Allergies  Allergen Reactions  . Aspirin Nausea And Vomiting    "can take coated aspirin"  . Erythromycin Nausea And Vomiting  . Nitrofurantoin Other (See Comments)    Unknown   . Azithromycin Nausea And Vomiting and Palpitations    "made my heart speed up"    Current Outpatient Medications  Medication Sig Dispense Refill  . fexofenadine (ALLEGRA) 30 MG tablet Take 30 mg by mouth daily.    Marland Kitchen LORazepam (ATIVAN) 1 MG tablet Take 1 tablet (1 mg total) by mouth every 8 (eight) hours. 12 tablet 0  . metoprolol succinate (TOPROL-XL) 50 MG 24 hr tablet TAKE 1 TABLET BY MOUTH TWICE DAILY, TAKE WITH OR IMMEDIATELY FOLLOWING A MEAL 180 tablet 1  . valACYclovir (VALTREX) 500 MG tablet Take 500 mg by mouth daily.       No current facility-administered medications for this visit.     Past Medical History:  Diagnosis Date  . HELLP (hemolytic anemia/elev liver enzymes/low platelets in pregnancy)   . Hypertension   . 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   PHYSICAL EXAM BP 122/78   Pulse 67   Ht 5\' 4"  (1.626 m)   Wt 136 lb 3.2 oz (61.8 kg)   BMI 23.38 kg/m   GENERAL:  Well appearing NECK:  No jugular venous distention, waveform within normal limits, carotid upstroke brisk and symmetric, no bruits, no thyromegaly LUNGS:  Clear to auscultation bilaterally CHEST:  Unremarkable HEART:  PMI not displaced or sustained,S1 and S2 within normal  limits, no S3, no S4, no clicks, no rubs, no murmurs ABD:  Flat, positive bowel sounds normal in frequency in pitch, no bruits, no rebound, no guarding, no midline pulsatile mass, no hepatomegaly, no splenomegaly EXT:  2 plus pulses throughout, no edema, no cyanosis no clubbing   EKG:  Sinus rhythm, rate 67 , rigthward axis, intervals within normal limits, no acute ST-T wave changes.  07/31/2018   ASSESSMENT AND PLAN  Palpitations - This is a stable pattern.  No change in therapy.  No further evaluation.  Mitral regurgitation - I do not hear any mitral regurgitation.  I will listen for this and annual physicals.  No change in therapy.  No further imaging at this point.  I do not hear any MR.  No further imaging is indicated.

## 2018-07-31 ENCOUNTER — Ambulatory Visit (INDEPENDENT_AMBULATORY_CARE_PROVIDER_SITE_OTHER): Payer: BC Managed Care – PPO | Admitting: Cardiology

## 2018-07-31 ENCOUNTER — Encounter: Payer: Self-pay | Admitting: Cardiology

## 2018-07-31 VITALS — BP 122/78 | HR 67 | Ht 64.0 in | Wt 136.2 lb

## 2018-07-31 DIAGNOSIS — R002 Palpitations: Secondary | ICD-10-CM

## 2018-07-31 NOTE — Patient Instructions (Signed)
Medication Instructions:  Your Physician recommend you continue on your current medication as directed.    If you need a refill on your cardiac medications before your next appointment, please call your pharmacy.   Lab work: None  Testing/Procedures: None  Follow-Up: At [facility], you and your health needs are our priority.  As part of our continuing mission to provide you with exceptional heart care, we have created designated Provider Care Teams.  These Care Teams include your primary Cardiologist (physician) and Advanced Practice Providers (APPs -  Physician Assistants and Nurse Practitioners) who all work together to provide you with the care you need, when you need it. You will need a follow up appointment in 12 months.  Please call our office 2 months in advance to schedule this appointment.  You may see Dr. Percival Spanish or one of the following Advanced Practice Providers on your designated Care Team:   Rosaria Ferries, PA-C . Jory Sims, DNP, ANP

## 2018-10-23 ENCOUNTER — Other Ambulatory Visit: Payer: Self-pay | Admitting: Obstetrics and Gynecology

## 2018-10-23 DIAGNOSIS — N6009 Solitary cyst of unspecified breast: Secondary | ICD-10-CM

## 2019-01-18 ENCOUNTER — Other Ambulatory Visit: Payer: Self-pay | Admitting: Cardiology

## 2019-02-03 LAB — NOVEL CORONAVIRUS, NAA: SARS CoV2 RNA: NOT DETECTED

## 2019-03-11 ENCOUNTER — Telehealth: Payer: Self-pay | Admitting: Cardiology

## 2019-03-11 NOTE — Telephone Encounter (Signed)
Follow Up:    Pt said she sent a message on my-chart on Monday, have not had a respond to it. Said she had talked to somebody on Friday, but not since she wrote on Monday.

## 2019-03-11 NOTE — Telephone Encounter (Signed)
March 11, 2019 Minus Breeding, MD to Earvin Hansen, LPN   13:14 AM Please send a letter indicating that she has HTN    Created letter per Dr. Percival Spanish request, and pended in chart. Please edit if additional information is needed.  Reviewed pt mychart message and it appears that pt uploaded a form entitled "Fill in [place]" that she would like to be completed.   Spoke with pt who states that her employer provided her and other employees with form late but employer needs form very soon. Informed her that letter was created but there may additional info needed to be included in the letter so triage nurse pended the letter and will be routing telephone encounter to Dr. Rosezella [place] primary nurse to f/u with pt

## 2019-03-12 ENCOUNTER — Encounter: Payer: Self-pay | Admitting: *Deleted

## 2019-03-12 NOTE — Telephone Encounter (Signed)
S/w pt and filled out forms given to Dr Debara Pickett

## 2019-03-12 NOTE — Telephone Encounter (Signed)
Follow up:     Patient calling concering her letter. She has to turn this in today. Please call patient.

## 2019-04-09 ENCOUNTER — Other Ambulatory Visit: Payer: Self-pay

## 2019-04-09 ENCOUNTER — Ambulatory Visit (INDEPENDENT_AMBULATORY_CARE_PROVIDER_SITE_OTHER): Payer: Federal, State, Local not specified - PPO | Admitting: Family Medicine

## 2019-04-09 ENCOUNTER — Encounter: Payer: Self-pay | Admitting: Family Medicine

## 2019-04-09 VITALS — BP 130/82 | HR 78 | Ht 64.0 in | Wt 136.0 lb

## 2019-04-09 DIAGNOSIS — F4312 Post-traumatic stress disorder, chronic: Secondary | ICD-10-CM

## 2019-04-09 DIAGNOSIS — Z833 Family history of diabetes mellitus: Secondary | ICD-10-CM | POA: Insufficient documentation

## 2019-04-09 DIAGNOSIS — F341 Dysthymic disorder: Secondary | ICD-10-CM | POA: Diagnosis not present

## 2019-04-09 DIAGNOSIS — F410 Panic disorder [episodic paroxysmal anxiety] without agoraphobia: Secondary | ICD-10-CM

## 2019-04-09 DIAGNOSIS — Z8249 Family history of ischemic heart disease and other diseases of the circulatory system: Secondary | ICD-10-CM | POA: Insufficient documentation

## 2019-04-09 DIAGNOSIS — R002 Palpitations: Secondary | ICD-10-CM

## 2019-04-09 DIAGNOSIS — Z8759 Personal history of other complications of pregnancy, childbirth and the puerperium: Secondary | ICD-10-CM

## 2019-04-09 DIAGNOSIS — Z7689 Persons encountering health services in other specified circumstances: Secondary | ICD-10-CM | POA: Diagnosis not present

## 2019-04-09 DIAGNOSIS — I159 Secondary hypertension, unspecified: Secondary | ICD-10-CM

## 2019-04-09 NOTE — Progress Notes (Signed)
New patient office visit note:  Impression and Recommendations:    1. Encounter to establish care with new doctor   2. ANXIETY DEPRESSION   3. Chronic post-traumatic stress disorder (PTSD)   4. Panic attacks   5. History of gestational hypertension   6. Secondary hypertension   7. Heart palpitation   8. Family history of diabetes mellitus-in her parent   38. Family history of essential hypertension      Encounter to Establish Care with New Doctor - Extensive discussion held with patient regarding establishing as a new patient.  Discussed policies and practices here at the clinic, and answered all questions about care team and health management during appointment.  - Discussed need for patient to continue to obtain management and screenings with all established specialists.  Educated patient at length about the critical importance of keeping health maintenance up to date.  - Participated in lengthy conversation regarding patient's conditions and all questions were answered, advice rendered on treatment plan/care.   Anxiety, Onset During Childbirth 86 Years Ago - Discussed need for patient to establish with counselor for tarrgeted anxiety care.  -Discussed with patient that I do not give benzodiazepines for panic and anxiety that occurs 2-3 times per week.  Discussed possibility of starting anti--anxiety medications with her which patient declined today.  - Discussed referral to [facility] for specialist in anxiety / PTSD- pt declined twice.   - Reviewed the "spokes of the wheel" of mood and health management.  Stressed the importance of ongoing prudent habits, including regular exercise, appropriate sleep hygiene, healthful dietary habits, and prayer/meditation to relax.  -Handout given to her on counselors in the area that she can call on her own. 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   Health Counseling & Preventative Health Maintenance - Advised patient to continue working toward exercising to improve overall mental, physical, and emotional health.    - Encouraged patient to engage in daily physical activity, especially a formal exercise routine.  Recommended that the patient eventually strive for at least 150 minutes of moderate cardiovascular activity per week according to guidelines established by the [facility].   - Healthy dietary habits encouraged, including low-carb, and high amounts of lean protein in diet.   - Patient should also consume adequate amounts of water.   Education and routine counseling performed. Handouts provided.   Recommendations  - Need for fasting lab work to establish patient baseline. - Discussed need to follow up and CPE in near future. - Patient understands she will remain established with all specialists such as cardiology and GYN.  Pt was interviewed and evaluated by me in the clinic today for 40.5+ minutes, with over 50% time spent in face to face counseling of patients various medical conditions, treatment plans of those medical conditions including medicine management and lifestyle modification, strategies to improve health and well being; and in coordination of care. SEE ABOVE TREATMENT PLAN FOR DETAILS   Medications Discontinued During This Encounter  Medication Reason  . fexofenadine (ALLEGRA) 30 MG tablet Discontinued by provider  . LORazepam (ATIVAN) 1 MG tablet      Gross side effects, risk and benefits, and alternatives of medications discussed with patient.  Patient is aware that all medications have potential side effects and we are unable to predict every side effect or drug-drug interaction that may occur.  Expresses verbal  understanding and consents to current therapy plan and treatment regimen.  Return for CPE/ yrly physical, come fasting near future at your convenience.  Please see AVS handed  out to patient at the end of our visit for further patient instructions/ counseling done pertaining to today's office visit.    Note:  This document was prepared using Dragon voice recognition software and may include unintentional dictation errors.  This document serves as a record of services personally performed by Mellody Dance, DO. It was created on her behalf by Toni Amend, a trained medical scribe. The creation of this record is based on the scribe's personal observations and the provider's statements to them.   I have reviewed the above medical documentation for accuracy and completeness and I concur.  Mellody Dance, DO 04/09/2019 5:22 PM       ---------------------------------------------------------------------------------------------------------------------------------------------------------------------------------------------    Subjective:    Chief complaint:   Chief Complaint  Patient presents with  . Establish Care     HPI: Caroline Peterson is a pleasant 43 y.o. female who presents to [facility] today to review their medical history with me and establish care.   I asked the patient to review their chronic problem list with me to ensure everything was updated and accurate.    All recent office visits with other providers, any medical records that patient brought in etc  - I reviewed today.     We asked pt to get [place] their medical records from [facility] providers/ specialists that they had seen within the past 3-5 years- if they are in private practice and/or do not work for [employer], [facility], [place], [place] or [employer] owned practice.  Told them to call their specialists to clarify this if they are not sure.    Looking for a new doctor because she's just been going to Urgent Care for her healthcare.  Says COVID made her realize she needs a doctor. Has been talking about it for a couple of years and states "not really done  anything about it."  Social History Is a Animal nutritionist.  Past Medical History Says she hasn't had blood work in a while.  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 Has been thinking about going back and having surgery done. Did not have it done in the 70's because she was afraid of going deaf. Last spoke with ENT about nine years ago. Says her hearing fluctuates.  Had her tonsils and adenoids out in childhood which helped. She does not use a hearing aid. Says she can't hear thunder or cars coming.  - Acute Closed Angle Glaucoma Says she noticed her eyes were getting blurry at work. Sees Dr. Arlina Robes at [facility] in [place]. Denies family history of glaucoma.  Has been diagnosed with glaucoma for several years, maybe three or four. Says she had laser pinholes put in her eyes. Will have cataract surgery at age 47. Goes for follow-up every six months. Thinks it's diagnosed in her left eye, with "just signs" in the right eye.  Says her eyes do not "drain properly," and this was why she had the pinholes placed.  - Hypertension - onset during pregnancy Follows up with cardiology once a year, Dr. Percival Spanish. Notes she changed cardiologists historically because she wasn't a huge fan of her former cardiologist.  Onset during pregnancy at age 51.  States her blood pressure was 180/90; she was released from  the hospital on Thursday and back in the hospital on Saturday due to BP of 215/115 and climbing.  States she was placed on losartan at first.  Was originally followed by nephrology (she cannot remember when), but when she started having heart palpitations, she was referred to cardiology.  Ended up in the ER of [facility] with heart palpitations several years ago.  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 Percival Spanish.  Is on a beta blocker  because her pulse started getting higher.  - Anxiety, PTSD since birth of daughter (onset nine years ago) 51 during the birth of her 40 year old daughter she experienced a lot of anxiety in the hospital.  Says she has had panic attacks since.  States she has talked to a counselor about this off and on.  She used xanax sparingly in the past.  Has been experiencing more panic and anxiety again since COVID.  Says "sometimes once a week, sometimes once a month."  The panic attacks don't last long, "but they scare me."  She does not know how many panic attacks she's experienced in the past month, stating "maybe ten."  Says "since March, I've been good pretty much."  Feels she experiences panic 2-3 times per week.    Wt Readings from Last 3 Encounters:  04/09/19 136 lb (61.7 kg)  07/31/18 136 lb 3.2 oz (61.8 kg)  07/10/17 130 lb 12.8 oz (59.3 kg)   BP Readings from Last 3 Encounters:  04/09/19 130/82  07/31/18 122/78  01/01/18 111/74   Pulse Readings from Last 3 Encounters:  04/09/19 78  07/31/18 67  01/01/18 (!) 105   BMI Readings from Last 3 Encounters:  04/09/19 23.34 kg/m  07/31/18 23.38 kg/m  07/10/17 22.45 kg/m    Patient Care Team    Relationship Specialty Notifications Start End  Mellody Dance, DO PCP - General Family Medicine  04/09/19   Care, [facility] Urgent    04/09/19   Minus Breeding, MD Consulting Physician Cardiology  04/09/19   Elsie Saas, [place] Physician Orthopedic Surgery  04/09/19   Brien Few, MD Consulting Physician Obstetrics and Gynecology  04/09/19   Despina Hick, MD Consulting Physician Ophthalmology  04/09/19     Patient Active Problem List   Diagnosis Date Noted  . Family history of diabetes mellitus-in her parent 04/09/2019  . History of gestational hypertension 04/09/2019  . Family history of essential hypertension 04/09/2019  . Panic attacks 04/09/2019  . Chronic post-traumatic stress disorder (PTSD) 04/09/2019  .  Palpitation 02/17/2013  . Hypertension 11/22/2010  . METHICILLIN RESISTANT STAPHYLOCOCCUS AUREUS INFECTION 10/19/2010  . ANXIETY DEPRESSION 10/19/2010       As reported by pt:  Past Medical History:  Diagnosis Date  . HELLP (hemolytic anemia/elev liver enzymes/low platelets in pregnancy)   . Hypertension   . 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 CESAREAN SECTION    . DILATION AND CURETTAGE OF UTERUS    . MOUTH SURGERY  1994   wisdom teeth   . TONSILLECTOMY AND ADENOIDECTOMY       Family History  Problem Relation Age of Onset  . Hypertension Mother   . Hyperlipidemia Mother   . Diabetes Father   . Hyperlipidemia Father   . Hypertension Brother      Social History  Substance and Sexual Activity  Drug Use No     Social History   Substance and Sexual Activity  Alcohol Use No     Social History   Tobacco Use  Smoking Status Never Smoker  Smokeless Tobacco Never Used     Current Meds  Medication Sig  . metoprolol succinate (TOPROL-XL) 50 MG 24 hr tablet TAKE 1 TABLET BY MOUTH TWICE DAILY WITH OR IMMEDIATELY FOLLOWING A MEAL  . Multiple Vitamin (MULTIVITAMIN) tablet Take 1 tablet by mouth daily.  . valACYclovir (VALTREX) 500 MG tablet Take 500 mg by mouth daily.    . vitamin C (ASCORBIC ACID) 500 MG tablet Take 500 mg by mouth daily.  . [DISCONTINUED] fexofenadine (ALLEGRA) 30 MG tablet Take 30 mg by mouth daily.    Allergies: Aspirin, Erythromycin, Nitrofurantoin, and Azithromycin   Review of Systems  Constitutional: Negative for chills, diaphoresis, fever, malaise/fatigue and weight loss.  HENT: Positive for hearing loss (chronic difficulty) and tinnitus (chronic). Negative for congestion and sore throat.   Eyes: Negative for blurred vision, double vision and photophobia.  Respiratory: Negative for cough and wheezing.   Cardiovascular: Positive for palpitations (chronic). Negative  for chest pain.  Gastrointestinal: Negative for blood in stool, diarrhea, nausea and vomiting.  Genitourinary: Negative for dysuria, frequency and urgency.  Musculoskeletal: Negative for joint pain and myalgias.  Skin: Negative for itching and rash.  Neurological: Negative for dizziness, focal weakness, weakness and headaches.  Endo/Heme/Allergies: Negative for environmental allergies and polydipsia. Does not bruise/bleed easily.  Psychiatric/Behavioral: Negative for depression and memory loss. The patient is nervous/anxious (chronic). The patient does not have insomnia.         Objective:   Blood pressure 130/82, pulse 78, height 5\' 4"  (1.626 m), weight 136 lb (61.7 kg), last menstrual period 03/30/2019, SpO2 100 %. Body mass index is 23.34 kg/m. General: Well Developed, well nourished, and in no acute distress.  Neuro: Alert and oriented x3, extra-ocular muscles intact, sensation grossly intact.  HEENT:/AT, PERRLA, neck supple, No carotid bruits Skin: no gross rashes  Cardiac: Regular rate and rhythm Respiratory: Essentially clear to auscultation bilaterally. Not using accessory muscles, speaking in full sentences.  Abdominal: not grossly distended Musculoskeletal: Ambulates w/o diff, FROM * 4 ext.  Vasc: less 2 sec cap RF, warm and pink  Psych:  No HI/SI, judgement and insight good, Euthymic mood. Full Affect.    No results found for this or any previous visit (from the past 2160 hour(s)).

## 2019-04-09 NOTE — Patient Instructions (Signed)
Please realize, EXERCISE IS MEDICINE!  -  American Heart Association ( AHA) guidelines for exercise : If you are in good health, without any medical conditions, you should engage in 150-300 minutes of moderate intensity aerobic activity per week.  This means you should be huffing and puffing throughout your workout.   Engaging in regular exercise will improve brain function and memory, as well as improve mood, boost immune system and help with weight management.  As well as the other, more well-known effects of exercise such as decreasing blood sugar levels, decreasing blood pressure,  and decreasing bad cholesterol levels/ increasing good cholesterol levels.     -  The AHA strongly endorses consumption of a diet that contains a variety of foods from all the food categories with an emphasis on fruits and vegetables; fat-free and low-fat dairy products; cereal and grain products; legumes and nuts; and fish, poultry, and/or extra lean meats.    Excessive food intake, especially of foods high in saturated and trans fats, sugar, and salt, should be avoided.    Adequate water intake of roughly 1/2 of your weight in pounds, should equal the ounces of water per day you should drink.  So for instance, if you're 200 pounds, that would be 100 ounces of water per day.         Mediterranean Diet  Why follow it? Research shows. . Those who follow the Mediterranean diet have a reduced risk of heart disease  . The diet is associated with a reduced incidence of Parkinson's and Alzheimer's diseases . People following the diet may have longer life expectancies and lower rates of chronic diseases  . The Dietary Guidelines for Americans recommends the Mediterranean diet as an eating plan to promote health and prevent disease  What Is the Mediterranean Diet?  . Healthy eating plan based on typical foods and recipes of Mediterranean-style cooking . The diet is primarily a plant based diet; these foods should make up a  majority of meals   Starches - Plant based foods should make up a majority of meals - They are an important sources of vitamins, minerals, energy, antioxidants, and fiber - Choose whole grains, foods high in fiber and minimally processed items  - Typical grain sources include wheat, oats, barley, corn, Mode rice, bulgar, farro, millet, polenta, couscous  - Various types of beans include chickpeas, lentils, fava beans, black beans, white beans   Fruits  Veggies - Large quantities of antioxidant rich fruits & veggies; 6 or more servings  - Vegetables can be eaten raw or lightly drizzled with oil and cooked  - Vegetables common to the traditional Mediterranean Diet include: artichokes, arugula, beets, broccoli, brussel sprouts, cabbage, carrots, celery, collard greens, cucumbers, eggplant, kale, leeks, lemons, lettuce, mushrooms, okra, onions, peas, peppers, potatoes, pumpkin, radishes, rutabaga, shallots, spinach, sweet potatoes, turnips, zucchini - Fruits common to the Mediterranean Diet include: apples, apricots, avocados, cherries, clementines, dates, figs, grapefruits, grapes, melons, nectarines, oranges, peaches, pears, pomegranates, strawberries, tangerines  Fats - Replace butter and margarine with healthy oils, such as olive oil, canola oil, and tahini  - Limit nuts to no more than a handful a day  - Nuts include walnuts, almonds, pecans, pistachios, pine nuts  - Limit or avoid candied, honey roasted or heavily salted nuts - Olives are central to the Mediterranean diet - can be eaten whole or used in a variety of dishes   Meats Protein - Limiting red meat: no more than a few times a month -   When eating red meat: choose lean cuts and keep the portion to the size of deck of cards - Eggs: approx. 0 to 4 times a week  - Fish and lean poultry: at least 2 a week  - Healthy protein sources include, chicken, [place], lean beef, lamb - Increase intake of seafood such as tuna, salmon, trout,  mackerel, shrimp, scallops - Avoid or limit high fat processed meats such as sausage and bacon  Dairy - Include moderate amounts of low fat dairy products  - Focus on healthy dairy such as fat free yogurt, skim milk, low or reduced fat cheese - Limit dairy products higher in fat such as whole or 2% milk, cheese, ice cream  Alcohol - Moderate amounts of red wine is ok  - No more than 5 oz daily for women (all ages) and men older than age 72  - No more than 10 oz of wine daily for men younger than 12  Other - Limit sweets and other desserts  - Use herbs and spices instead of salt to flavor foods  - Herbs and spices common to the traditional Mediterranean Diet include: basil, bay leaves, chives, cloves, cumin, fennel, garlic, lavender, marjoram, mint, oregano, parsley, pepper, rosemary, sage, savory, sumac, tarragon, thyme   It's not just a diet, it's a lifestyle:  . The Mediterranean diet includes lifestyle factors typical of those in the region  . Foods, drinks and meals are best eaten with others and savored . Daily physical activity is important for overall good health . This could be strenuous exercise like running and aerobics . This could also be more leisurely activities such as walking, housework, yard-work, or taking the stairs . Moderation is the key; a balanced and healthy diet accommodates most foods and drinks . Consider portion sizes and frequency of consumption of certain foods   Meal Ideas & Options:  . Breakfast:  o Whole wheat toast or whole wheat English muffins with peanut butter & hard boiled egg o Steel cut oats topped with apples & cinnamon and skim milk  o Fresh fruit: banana, strawberries, melon, berries, peaches  o Smoothies: strawberries, bananas, greek yogurt, peanut butter o Low fat greek yogurt with blueberries and granola  o Egg white omelet with spinach and mushrooms o Breakfast couscous: whole wheat couscous, apricots, skim milk, cranberries  . Sandwiches:   o Hummus and grilled vegetables (peppers, zucchini, squash) on whole wheat bread   o Grilled chicken on whole wheat pita with lettuce, tomatoes, cucumbers or tzatziki  o Tuna salad on whole wheat bread: tuna salad made with greek yogurt, olives, red peppers, capers, green onions o Garlic rosemary lamb pita: lamb sauted with garlic, rosemary, salt & pepper; add lettuce, cucumber, greek yogurt to pita - flavor with lemon juice and black pepper  . Seafood:  o Mediterranean grilled salmon, seasoned with garlic, basil, parsley, lemon juice and black pepper o Shrimp, lemon, and spinach whole-grain pasta salad made with low fat greek yogurt  o Seared scallops with lemon orzo  o Seared tuna steaks seasoned salt, pepper, coriander topped with tomato mixture of olives, tomatoes, olive oil, minced garlic, parsley, green onions and cappers  . Meats:  o Herbed greek chicken salad with kalamata olives, cucumber, feta  o Red bell peppers stuffed with spinach, bulgur, lean ground beef (or lentils) & topped with feta   o Kebabs: skewers of chicken, tomatoes, onions, zucchini, squash  o [place] burgers: made with red onions, mint, dill, lemon juice, feta  cheese topped with roasted red peppers . Vegetarian o Cucumber salad: cucumbers, artichoke hearts, celery, red onion, feta cheese, tossed in olive oil & lemon juice  o Hummus and whole grain pita points with a greek salad (lettuce, tomato, feta, olives, cucumbers, red onion) o Lentil soup with celery, carrots made with vegetable broth, garlic, salt and pepper  o Tabouli salad: parsley, bulgur, mint, scallions, cucumbers, tomato, radishes, lemon juice, olive oil, salt and pepper.     Behavioral Health/ Counseling Referrals      [facility] Sigurd Sos Phipps:  [address] [place], [place] [phone] (not exclusive, is a Economist, but does have special interest in  eating disorders, body dysmorphia syndrome etc)     [facility]: Counselors : Lynden Ang and partner Phil Dopp in Adult & Pediatric Nutrition Counseling Therapy for:  Eating Disorders  Disordered & Emotional Eating  Non-Diet Nutrition Counseling  Family Feeding Coaching  Weight- Neutral Diabetes Management    Anderson Malta, personal counselor in [place], specializing in marriage counseling    Dr. Tomi Bamberger, PHD Dr. Tomi Bamberger, PHD is a counselor in [place], [place].  [address] [place], [place] [address] [place] [phone]   Kristie Cowman, [place]  [phone] [email or web address] ( she does [place] and faith-based coaching and counseling )    [employer]- ( faith-based counseling ) Address: [address] [place], [place] [address] [phone] [address] for appointments [phone] Fax Hours: Monday - Thursday 8:00am-6:00pm Closed for lunch 12-1Thursday only Friday: Closed all day   Steffanie Rainwater: [phone] or Meg [phone] -counselors in [place] who are faith based   -Also Ms. Marya Fossa - [place] behavioral medicine. Darrick Meigs based counseling.    [facility] Nunzio Cobbs, LCSW, ACSW, M.ED.  -Nunzio Cobbs is a licensed clinical social worker in practice over 35 years and with Dr. Chucky May for the last 10 years.  -She sees adults, adolescents, children & families and couples. -Services are provided for mood and anxiety disorders, marital issues, family or parent/child problems, parenting, co-dependency, gender issues, trauma, grief, and stages of life issues. She also provides critical incident stress debriefing.  -Pamala Hurry accepts many employee assistance programs (EAP), [employer], Pharmacist, hospital.  PHONE  [phone]                FAX [phone]   Rodena Goldmann -[email or web address] [employer]- gen counseling;  PHD   Wilber Oliphant, MSW [address] [place]   [place] Apolonio Schneiders, PhD [address] [facility] [phone]   [place] [facility]- children [address] [place], [place]    [facility]- various counselors [place], [place] [phone] ( one patient saw Gayland Curry and really liked her)    Lanelle Bal Professional Counselor Counseling and [employer] [phone]   [place] [facility] Taylor-Clinical Manager [address], [place] [phone] [place] [address] Leith Delmer Islam, PhD **   -adult, adolescent, and child Oneida Arenas, PhD Leitha Bleak, LCSW Jillene Bucks, PhD-child, adolescent and adults   [facility] [address], [place] [phone] Merilyn Baba, MS-child, adolescent and adults Lennart Pall, PhD-adolescent and adults   [facility]-grief,  terminal illness [place], [place], [employer] [address], [place], [place]   [facility] [address], [place], [place] [phone]   [facility] [address] [place] [phone]   [facility] [address], [place]   [facility] [address], [place]

## 2019-04-24 ENCOUNTER — Other Ambulatory Visit: Payer: Self-pay

## 2019-04-24 DIAGNOSIS — Z20822 Contact with and (suspected) exposure to covid-19: Secondary | ICD-10-CM

## 2019-04-26 ENCOUNTER — Ambulatory Visit (INDEPENDENT_AMBULATORY_CARE_PROVIDER_SITE_OTHER)
Admission: RE | Admit: 2019-04-26 | Discharge: 2019-04-26 | Disposition: A | Discharge status: Home / Self Care | Payer: BC Managed Care – PPO | Source: Ambulatory Visit

## 2019-04-26 DIAGNOSIS — J019 Acute sinusitis, unspecified: Secondary | ICD-10-CM

## 2019-04-26 DIAGNOSIS — B9689 Other specified bacterial agents as the cause of diseases classified elsewhere: Secondary | ICD-10-CM | POA: Diagnosis not present

## 2019-04-26 DIAGNOSIS — R05 Cough: Secondary | ICD-10-CM

## 2019-04-26 DIAGNOSIS — L0100 Impetigo, unspecified: Secondary | ICD-10-CM | POA: Diagnosis not present

## 2019-04-26 DIAGNOSIS — R059 Cough, unspecified: Secondary | ICD-10-CM

## 2019-04-26 LAB — NOVEL CORONAVIRUS, NAA: SARS-CoV-2, NAA: NOT DETECTED

## 2019-04-26 MED ORDER — GUAIFENESIN-CODEINE 100-10 MG/5ML PO SOLN: 5.0000 mL | Freq: Three times a day (TID) | ORAL | 0 refills | Status: DC | PRN

## 2019-04-26 MED ORDER — MUPIROCIN CALCIUM 2 % NA OINT: TOPICAL_OINTMENT | NASAL | 0 refills | Status: DC

## 2019-04-26 MED ORDER — AMOXICILLIN 500 MG PO CAPS: 500.0000 mg | ORAL_CAPSULE | Freq: Three times a day (TID) | ORAL | 0 refills | Status: DC

## 2019-04-26 NOTE — Discharge Instructions (Signed)
Take the antibiotic amoxicillin as prescribed.  Apply the topical antibiotic ointment to the sore on your nare.    Take the Robitussin with Codeine as needed for cough.. Do not drive, operate machinery, or drink alcohol with this medication as it may make you drowsy.    Follow up with your primary care provider or come here to be seen in person if your symptoms are not improving.

## 2019-04-26 NOTE — ED Provider Notes (Signed)
Virtual Visit via Video Note:  IRVA MORALAS  initiated request for Telemedicine visit with [facility] team. I connected with Caroline Peterson  on 04/26/2019 at 1:50 PM  for a synchronized telemedicine visit using a video enabled HIPPA compliant telemedicine application. I verified that I am speaking with Caroline Peterson  using two identifiers. [name], NP  was physically located in a [facility] Urgent care site and Caroline Peterson was located at a different location.   The limitations of evaluation and management by telemedicine as well as the availability of in-person appointments were discussed. Patient was informed that she  may incur a bill ( including co-pay) for this virtual visit encounter. Caroline Peterson  expressed understanding and gave verbal consent to proceed with virtual visit.     History of Present Illness:Caroline Peterson  is a 43 y.o. female presents for evaluation of nasal congestion, sneezing, nasal congestion, cough productive of green sputum x 10 days.  Had COVID test on 04/24/2019; results pending.  She reports cough is interfering with her sleep.  She denies fever, chills, shortness of breath, vomiting, diarrhea, or other symptoms.   LMP: 1 week.     Allergies  Allergen Reactions  . Aspirin Nausea And Vomiting    "can take coated aspirin"  . Erythromycin Nausea And Vomiting  . Nitrofurantoin Other (See Comments)    Unknown   . Azithromycin Nausea And Vomiting and Palpitations    "made my heart speed up"     Past Medical History:  Diagnosis Date  . HELLP (hemolytic anemia/elev liver enzymes/low platelets in pregnancy)   . Hypertension   . 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 Smoking status: Never Smoker  . Smokeless tobacco: Never Used  Substance Use Topics  . Alcohol use: No  . Drug use: No        Observations/Objective: Physical Exam  VITALS: Patient denies  fever. GENERAL: Alert, appears well and in no acute distress. HEENT: Atraumatic.  Crusting under left nare.  NECK: Normal movements of the head and neck. CARDIOPULMONARY: No increased WOB. Speaking in clear sentences. I:E ratio WNL.  MS: Moves all visible extremities without noticeable abnormality. PSYCH: Pleasant and cooperative, well-groomed. Speech normal rate and rhythm. Affect is appropriate. Insight and judgement are appropriate. Attention is focused, linear, and appropriate.  NEURO: CN grossly intact. Oriented as arrived to appointment on time with no prompting. Moves both UE equally.    Assessment and Plan:    ICD-10-CM   1. Cough  R05   2. Acute non-recurrent sinusitis, unspecified location  J01.90   3. Impetigo  L01.00        Follow Up Instructions:  Treating with amoxicillin, bactroban ointment, and Robitussin with Codeine.  Instructed patient to take amoxicillin as prescribed; apply antibiotic ointment to sore under left nare.  Instructed that she can take cough medication as prescribed; precautions for side effect of drowsiness discussed with patient.  Instructed her to follow up with her PCP or be seen here in person if her symptoms are not improving.  Patient agrees to [place].      I discussed the assessment and treatment plan with the patient. The patient was provided an opportunity to ask questions and all were answered. The patient agreed with the plan and demonstrated an understanding of the instructions.   The patient was advised to call back or  seek an in-person evaluation if the symptoms worsen or if the condition fails to improve as anticipated.      [name], NP  04/26/2019 1:50 PM         [name], NP 04/26/19 1353

## 2019-07-13 ENCOUNTER — Other Ambulatory Visit: Payer: Self-pay

## 2019-07-13 MED ORDER — METOPROLOL SUCCINATE ER 50 MG PO TB24: ORAL_TABLET | ORAL | 1 refills | Status: DC

## 2019-07-14 ENCOUNTER — Other Ambulatory Visit: Payer: Self-pay

## 2019-07-14 MED ORDER — METOPROLOL SUCCINATE ER 50 MG PO TB24: ORAL_TABLET | ORAL | 0 refills | Status: DC

## 2019-07-31 ENCOUNTER — Ambulatory Visit: Payer: BC Managed Care – PPO | Admitting: Cardiology

## 2019-08-21 ENCOUNTER — Ambulatory Visit: Payer: BC Managed Care – PPO | Admitting: Cardiology

## 2019-08-26 DIAGNOSIS — Z7189 Other specified counseling: Secondary | ICD-10-CM | POA: Insufficient documentation

## 2019-08-26 DIAGNOSIS — I340 Nonrheumatic mitral (valve) insufficiency: Secondary | ICD-10-CM | POA: Insufficient documentation

## 2019-08-26 NOTE — Progress Notes (Addendum)
Cardiology Office Note   Date:  08/28/2019   ID:  Caroline VECCHIARELLI, DOB 1976-05-21, MRN [id number]  PCP:  Patient, No Pcp Per  Cardiologist:   No primary care provider on file.   Chief Complaint  Patient presents with  . Palpitations      History of Present Illness: Caroline Peterson is a 44 y.o. female who presents for follow up of palpitations.   Since I last saw her she has had a couple of episodes of her blood pressures being in the 130s.  At one time it was 153/69.  Blood pressure reading is good today and not consistently above [id number] systolic.  She had an episode of her heart racing in the summer at 135 and she had some hot flashes but this has not been consistent.  She has been fatigued.  She is not sleeping very well.  She denies any new chest pressure, neck or arm discomfort.  She was physically active in the summer.  She is still working.  She is not able to bring on any symptoms.   Past Medical History:  Diagnosis Date  . HELLP (hemolytic anemia/elev liver enzymes/low platelets in pregnancy)   . Hypertension   . 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 CESAREAN SECTION    . DILATION AND CURETTAGE OF UTERUS    . MOUTH SURGERY  1994   wisdom teeth   . TONSILLECTOMY AND ADENOIDECTOMY       Current Outpatient Medications  Medication Sig Dispense Refill  . fexofenadine (ALLEGRA) 180 MG tablet Take 180 mg by mouth daily.    . metoprolol succinate (TOPROL-XL) 50 MG 24 hr tablet TAKE 1 TABLET BY MOUTH TWICE DAILY WITH OR IMMEDIATELY FOLLOWING A MEAL 180 tablet 0  . Multiple Vitamin (MULTIVITAMIN) tablet Take 1 tablet by mouth daily.    . phenazopyridine (PYRIDIUM) 200 MG tablet Take 200 mg by mouth 3 (three) times daily as needed for pain.    . valACYclovir (VALTREX) 500 MG tablet Take 500 mg by mouth daily.       No current facility-administered medications for this visit.    Allergies:   Aspirin,  Erythromycin, Nitrofurantoin, and Azithromycin    ROS:  Please see the history of present illness.   Otherwise, review of systems are positive for none.   All other systems are reviewed and negative.    PHYSICAL EXAM: VS:  BP 110/68   Pulse 78   Ht 5\' 4"  (1.626 m)   Wt 143 lb 12.8 oz (65.2 kg)   BMI 24.68 kg/m  , BMI Body mass index is 24.68 kg/m. GENERAL:  Well appearing NECK:  No jugular venous distention, waveform within normal limits, carotid upstroke brisk and symmetric, no bruits,positive goiter LUNGS:  Clear to auscultation bilaterally CHEST:  Unremarkable HEART:  PMI not displaced or sustained,S1 and S2 within normal limits, no S3, no S4, no clicks, no rubs, no murmurs ABD:  Flat, positive bowel sounds normal in frequency in pitch, no bruits, no rebound, no guarding, no midline pulsatile mass, no hepatomegaly, no splenomegaly EXT:  2 plus pulses throughout, no edema, no cyanosis no clubbing   EKG:  EKG is ordered today. The ekg ordered today demonstrates sinus rhythm, rate 78, axis within normal limits, intervals within normal limits, no acute ST-T wave changes.   Recent Labs: No results found for requested labs within last 8760 hours.  Lipid Panel No results found for: CHOL, TRIG, HDL, CHOLHDL, VLDL, LDLCALC, LDLDIRECT    Wt Readings from Last 3 Encounters:  08/28/19 143 lb 12.8 oz (65.2 kg)  04/09/19 136 lb (61.7 kg)  07/31/18 136 lb 3.2 oz (61.8 kg)      Other studies Reviewed: Additional studies/ records that were reviewed today include: None. Review of the above records demonstrates:  Please see elsewhere in the note.     ASSESSMENT AND PLAN:  Palpitations - These are few and far between but she will let me know if they increase in severity or frequency.  No change in therapy.   Mitral regurgitation - I do not hear a murmur.  No further imaging at this point.  HTN - She is going to keep a blood pressure diary and send me the results.  Fatigue  - I will check a TSH and CBC as well as a basic metabolic profile.  Covid education - She will get the vaccine when able and we talked about it.   Current medicines are reviewed at length with the patient today.  The patient does not have concerns regarding medicines.  The following changes have been made:  no change  Labs/ tests ordered today include:   Orders Placed This Encounter  Procedures  . CBC  . TSH  . Basic Metabolic Panel (BMET)     Disposition:   FU with me in one year    Signed, Minus Breeding, MD  08/28/2019 10:52 AM    [place]

## 2019-08-28 ENCOUNTER — Other Ambulatory Visit: Payer: Self-pay

## 2019-08-28 ENCOUNTER — Ambulatory Visit (INDEPENDENT_AMBULATORY_CARE_PROVIDER_SITE_OTHER): Payer: BC Managed Care – PPO | Admitting: Cardiology

## 2019-08-28 ENCOUNTER — Encounter: Payer: Self-pay | Admitting: Cardiology

## 2019-08-28 VITALS — BP 110/68 | HR 78 | Ht 64.0 in | Wt 143.8 lb

## 2019-08-28 DIAGNOSIS — I340 Nonrheumatic mitral (valve) insufficiency: Secondary | ICD-10-CM

## 2019-08-28 DIAGNOSIS — Z7189 Other specified counseling: Secondary | ICD-10-CM | POA: Diagnosis not present

## 2019-08-28 DIAGNOSIS — R002 Palpitations: Secondary | ICD-10-CM | POA: Diagnosis not present

## 2019-08-28 LAB — BASIC METABOLIC PANEL
BUN/Creatinine Ratio: 19 (ref 9–23)
BUN: 12 mg/dL (ref 6–24)
CO2: 23 mmol/L (ref 20–29)
Calcium: 9 mg/dL (ref 8.7–10.2)
Chloride: 104 mmol/L (ref 96–106)
Creatinine, Ser: 0.63 mg/dL (ref 0.57–1.00)
GFR calc Af Amer: 126 mL/min/{1.73_m2} (ref >59)
GFR calc non Af Amer: 109 mL/min/{1.73_m2} (ref >59)
Glucose: 84 mg/dL (ref 65–99)
Potassium: 4.7 mmol/L (ref 3.5–5.2)
Sodium: 141 mmol/L (ref 134–144)

## 2019-08-28 LAB — CBC
Hematocrit: 37.4 % (ref 34.0–46.6)
Hemoglobin: 12.9 g/dL (ref 11.1–15.9)
MCH: 30.9 pg (ref 26.6–33.0)
MCHC: 34.5 g/dL (ref 31.5–35.7)
MCV: 90 fL (ref 79–97)
Platelets: 260 10*3/uL (ref 150–450)
RBC: 4.17 x10E6/uL (ref 3.77–5.28)
RDW: 12.9 % (ref 11.7–15.4)
WBC: 5.4 10*3/uL (ref 3.4–10.8)

## 2019-08-28 LAB — TSH: TSH: 1.09 u[IU]/mL (ref 0.450–4.500)

## 2019-08-28 NOTE — Patient Instructions (Signed)
Medication Instructions:  No changes *If you need a refill on your cardiac medications before your next appointment, please call your pharmacy*  Lab Work: Your physician recommends that you return for lab work today. (CBC, BMP, TSH)  If you have labs (blood work) drawn today and your tests are completely normal, you will receive your results only by: Marland Kitchen MyChart Message (if you have MyChart) OR . A paper copy in the mail If you have any lab test that is abnormal or we need to change your treatment, we will call you to review the results.  Testing/Procedures: None  Follow-Up: At [facility], you and your health needs are our priority.  As part of our continuing mission to provide you with exceptional heart care, we have created designated Provider Care Teams.  These Care Teams include your primary Cardiologist (physician) and Advanced Practice Providers (APPs -  Physician Assistants and Nurse Practitioners) who all work together to provide you with the care you need, when you need it.  Your next appointment:   1 year(s)  You will receive a reminder letter in the mail two months in advance. If you don't receive a letter, please call our office to schedule the follow-up appointment.   The format for your next appointment:   In Person  Provider:   Minus Breeding, MD  Other Instructions Keep a record of your blood pressure

## 2019-09-07 NOTE — Addendum Note (Signed)
Addended by: Therisa Doyne on: 09/07/2019 04:23 PM   Modules accepted: Orders

## 2019-09-08 ENCOUNTER — Inpatient Hospital Stay: Admission: RE | Admit: 2019-09-08 | Payer: BC Managed Care – PPO | Source: Ambulatory Visit

## 2019-11-09 ENCOUNTER — Ambulatory Visit (INDEPENDENT_AMBULATORY_CARE_PROVIDER_SITE_OTHER): Payer: BC Managed Care – PPO | Admitting: Family Medicine

## 2019-11-09 ENCOUNTER — Encounter: Payer: Self-pay | Admitting: Family Medicine

## 2019-11-09 ENCOUNTER — Other Ambulatory Visit: Payer: Self-pay

## 2019-11-09 VITALS — BP 120/84 | HR 76 | Temp 98.3°F | Wt 151.4 lb

## 2019-11-09 DIAGNOSIS — R002 Palpitations: Secondary | ICD-10-CM

## 2019-11-09 DIAGNOSIS — J309 Allergic rhinitis, unspecified: Secondary | ICD-10-CM

## 2019-11-09 DIAGNOSIS — Z0001 Encounter for general adult medical examination with abnormal findings: Secondary | ICD-10-CM

## 2019-11-09 DIAGNOSIS — I159 Secondary hypertension, unspecified: Secondary | ICD-10-CM | POA: Diagnosis not present

## 2019-11-09 DIAGNOSIS — M1990 Unspecified osteoarthritis, unspecified site: Secondary | ICD-10-CM

## 2019-11-09 DIAGNOSIS — B001 Herpesviral vesicular dermatitis: Secondary | ICD-10-CM | POA: Insufficient documentation

## 2019-11-09 DIAGNOSIS — Z6825 Body mass index (BMI) 25.0-25.9, adult: Secondary | ICD-10-CM

## 2019-11-09 DIAGNOSIS — Q159 Congenital malformation of eye, unspecified: Secondary | ICD-10-CM

## 2019-11-09 DIAGNOSIS — G4700 Insomnia, unspecified: Secondary | ICD-10-CM

## 2019-11-09 DIAGNOSIS — E663 Overweight: Secondary | ICD-10-CM

## 2019-11-09 NOTE — Assessment & Plan Note (Signed)
Stable.  Continue metoprolol succinate 50 mg twice daily per cardiology.

## 2019-11-09 NOTE — Assessment & Plan Note (Signed)
Stable.  Continue management per orthopedics. 

## 2019-11-09 NOTE — Progress Notes (Signed)
Chief Complaint:  Caroline Peterson is a 44 y.o. female who presents today for her annual comprehensive physical exam.  She is establishing care today.   Assessment/Plan:  Chronic Problems Addressed Today: Palpitation Stable.  Continue metoprolol succinate 50 mg twice daily per cardiology.  Eye abnormality At risk for glaucoma and cataracts per patient.  Continue management per ophthalmology.  Osteoarthritis Stable.  Continue management per orthopedics.  Cold sore Continue Valtrex 500 mg daily.  Advised patient we can increase to 1000 mg daily if still having outbreaks.  Insomnia Poorly controlled.  Recommend increasing melatonin to 10 mg nightly.  Also recommended 25 to 50 mg of Benadryl nightly as needed.  Allergic rhinitis Stable.  Continue Allegra 180 mg daily.  Hypertension At goal.  Continue metoprolol succinate 50 mg twice daily per cardiology.   Body mass index is 25.99 kg/m. / Overweight BMI Metric Follow Up - 11/09/19 1317      BMI Metric Follow Up-Please document annually   BMI Metric Follow Up  Education provided       Preventative Healthcare: Up-to-date on vaccines and screenings.  Patient Counseling(The following topics were reviewed and/or handout was given):  -Nutrition: Stressed importance of moderation in sodium/caffeine intake, saturated fat and cholesterol, caloric balance, sufficient intake of fresh fruits, vegetables, and fiber.  -Stressed the importance of regular exercise.   -Substance Abuse: Discussed cessation/primary prevention of tobacco, alcohol, or other drug use; driving or other dangerous activities under the influence; availability of treatment for abuse.   -Injury prevention: Discussed safety belts, safety helmets, smoke detector, smoking near bedding or upholstery.   -Sexuality: Discussed sexually transmitted diseases, partner selection, use of condoms, avoidance of unintended pregnancy and contraceptive alternatives.   -Dental  health: Discussed importance of regular tooth brushing, flossing, and dental visits.  -Health maintenance and immunizations reviewed. Please refer to Health maintenance section.  Return to care in 1 year for next preventative visit.     Subjective:  HPI:  She has no acute complaints today.   Her stable, chronic medical conditions are outlined below:  # Essential Hypertension / Tachycardia - Follows with cardiology - On metoprolol succinate 50mg  twice daily  # Allergic Rhinitis - On allegra 180mg  daily and tolerating well  # Insomnia - On melatonin nightly  # Fever Blisters - On valtrex 500mg  daily  # Osteoarthritis - Follows with orthopedics  # Eye abnormality - Follows with ophthalmology  Lifestyle Diet: None.  Exercise: None due to orthopedic conditions.   Depression screen [facility] 2/9 11/09/2019  Decreased Interest 0  Down, Depressed, Hopeless 0  PHQ - 2 Score 0  Altered sleeping 3  Tired, decreased energy 0  Change in appetite 0  Feeling bad or failure about yourself  0  Trouble concentrating 0  Moving slowly or fidgety/restless 0  Suicidal thoughts 0  PHQ-9 Score 3  Difficult doing work/chores Somewhat difficult    Health Maintenance Due  Topic Date Due  . HIV Screening  Never done  . TETANUS/TDAP  08/14/2019     ROS: Per HPI, otherwise a complete review of systems was negative.   PMH:  The following were reviewed and entered/updated in epic: Past Medical History:  Diagnosis Date  . HELLP (hemolytic anemia/elev liver enzymes/low platelets in pregnancy)   . Hypertension   . 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 Allergic rhinitis 11/09/2019  . Insomnia 11/09/2019  . Cold sore  11/09/2019  . Osteoarthritis 11/09/2019  . Eye abnormality 11/09/2019  . Palpitation 02/17/2013  . Hypertension 11/22/2010   Past Surgical History:  Procedure Laterality Date  . CESAREAN SECTION    .  DILATION AND CURETTAGE OF UTERUS    . EYE SURGERY Bilateral 2019   Laser peripheral iridotomy  . EYE SURGERY Left 2018   Laser peripheral iridotomy   . MOUTH SURGERY  1994   wisdom teeth   . TONSILLECTOMY AND ADENOIDECTOMY      Family History  Problem Relation Age of Onset  . Hypertension Mother   . Hyperlipidemia Mother   . Diabetes Father   . Hyperlipidemia Father   . Hypertension Father   . Kidney disease Father   . Hypertension Brother   . Cancer Maternal Grandmother   . Cancer Maternal Grandfather   . Cancer Paternal Grandmother   . Heart disease Paternal Grandmother   . Heart disease Paternal Grandfather   . Hypertension Paternal Grandfather     Medications- reviewed and updated Current Outpatient Medications  Medication Sig Dispense Refill  . fexofenadine (ALLEGRA) 180 MG tablet Take 180 mg by mouth daily.    Marland Kitchen MELATONIN PO Take by mouth.    . metoprolol succinate (TOPROL-XL) 50 MG 24 hr tablet TAKE 1 TABLET BY MOUTH TWICE DAILY WITH OR IMMEDIATELY FOLLOWING A MEAL 180 tablet 0  . Multiple Vitamin (MULTIVITAMIN) tablet Take 1 tablet by mouth daily.    . valACYclovir (VALTREX) 500 MG tablet Take 500 mg by mouth daily.       No current facility-administered medications for this visit.    Allergies-reviewed and updated Allergies  Allergen Reactions  . Aspirin Nausea And Vomiting    "can take coated aspirin"  . Erythromycin Nausea And Vomiting  . Nitrofurantoin Other (See Comments)    Unknown   . Azithromycin Nausea And Vomiting and Palpitations    "made my heart speed up"    Social History   Socioeconomic History  . Marital status: Married    Spouse name: Reyhan Lynd  . Number of children: 1  . Years of education: Not on file  . Highest education level: Not on file  Occupational History    Employer: [employer]  Tobacco Use  . Smoking status: Never Smoker  . Smokeless tobacco: Never Used  Substance and Sexual Activity  . Alcohol use:  No  . Drug use: No  . Sexual activity: Yes    Birth control/protection: Other-see comments  Other Topics Concern  . Not on file  Social History Narrative   Lives with husband and daughter.    Social Determinants of Health   Financial Resource Strain:   . Difficulty of Paying Living Expenses:   Food Insecurity:   . Worried About Charity fundraiser in the Last Year:   . Arboriculturist in the Last Year:   Transportation Needs:   . Film/video editor (Medical):   Marland Kitchen Lack of Transportation (Non-Medical):   Physical Activity:   . Days of Exercise per Week:   . Minutes of Exercise per Session:   Stress:   . Feeling of Stress :   Social Connections:   . Frequency of Communication with Friends and Family:   . Frequency of Social Gatherings with Friends and Family:   . Attends Religious Services:   . Active Member of Clubs or Organizations:   . Attends Archivist Meetings:   Marland Kitchen Marital Status:  Objective:  Physical Exam: BP 120/84 (BP Location: Right Arm, Patient Position: Sitting, Cuff Size: Normal)   Pulse 76   Temp 98.3 F (36.8 C) (Temporal)   Wt 151 lb 6.4 oz (68.7 kg)   LMP 10/26/2019   SpO2 100%   BMI 25.99 kg/m   Body mass index is 25.99 kg/m. Wt Readings from Last 3 Encounters:  11/09/19 151 lb 6.4 oz (68.7 kg)  08/28/19 143 lb 12.8 oz (65.2 kg)  04/09/19 136 lb (61.7 kg)   Gen: NAD, resting comfortably HEENT: TMs normal bilaterally. OP clear. No thyromegaly noted.  CV: RRR with no murmurs appreciated Pulm: NWOB, CTAB with no crackles, wheezes, or rhonchi GI: Normal bowel sounds present. Soft, Nontender, Nondistended. MSK: no edema, cyanosis, or clubbing noted Skin: warm, dry Neuro: CN2-12 grossly intact. Strength 5/5 in upper and lower extremities. Reflexes symmetric and intact bilaterally.  Psych: Normal affect and thought content     America Sandall M. Jerline Pain, MD 11/09/2019 2:13 PM

## 2019-11-09 NOTE — Assessment & Plan Note (Signed)
Stable.  Continue Allegra 180 mg daily.

## 2019-11-09 NOTE — Assessment & Plan Note (Signed)
At risk for glaucoma and cataracts per patient.  Continue management per ophthalmology.

## 2019-11-09 NOTE — Assessment & Plan Note (Signed)
At goal.  Continue metoprolol succinate 50 mg twice daily per cardiology.

## 2019-11-09 NOTE — Assessment & Plan Note (Signed)
Continue Valtrex 500 mg daily.  Advised patient we can increase to 1000 mg daily if still having outbreaks.

## 2019-11-09 NOTE — Patient Instructions (Signed)
It was very nice to see you today!  Please increase your melatonin to 10 mg nightly.  You can also use 25 to 50 mg of Benadryl nightly.  Please let me know if you like to change your dose of Valtrex.  Come back to see me in 1 year for your next physical, or sooner if needed.  Take care, Dr Jerline Pain  Please try these tips to maintain a healthy lifestyle:   Eat at least 3 REAL meals and 1-2 snacks per day.  Aim for no more than 5 hours between eating.  If you eat breakfast, please do so within one hour of getting up.    Each meal should contain half fruits/vegetables, one quarter protein, and one quarter carbs (no bigger than a computer mouse)   Cut down on sweet beverages. This includes juice, soda, and sweet tea.     Drink at least 1 glass of water with each meal and aim for at least 8 glasses per day   Exercise at least 150 minutes every week.    Preventive Care 30-49 Years Old, Female Preventive care refers to visits with your health care provider and lifestyle choices that can promote health and wellness. This includes:  A yearly physical exam. This may also be called an annual well check.  Regular dental visits and eye exams.  Immunizations.  Screening for certain conditions.  Healthy lifestyle choices, such as eating a healthy diet, getting regular exercise, not using drugs or products that contain nicotine and tobacco, and limiting alcohol use. What can I expect for my preventive care visit? Physical exam Your health care provider will check your:  Height and weight. This may be used to calculate body mass index (BMI), which tells if you are at a healthy weight.  Heart rate and blood pressure.  Skin for abnormal spots. Counseling Your health care provider may ask you questions about your:  Alcohol, tobacco, and drug use.  Emotional well-being.  Home and relationship well-being.  Sexual activity.  Eating habits.  Work and work Statistician.  Method  of birth control.  Menstrual cycle.  Pregnancy history. What immunizations do I need?  Influenza (flu) vaccine  This is recommended every year. Tetanus, diphtheria, and pertussis (Tdap) vaccine  You may need a Td booster every 10 years. Varicella (chickenpox) vaccine  You may need this if you have not been vaccinated. Zoster (shingles) vaccine  You may need this after age 66. Measles, mumps, and rubella (MMR) vaccine  You may need at least one dose of MMR if you were born in 1957 or later. You may also need a second dose. Pneumococcal conjugate (PCV13) vaccine  You may need this if you have certain conditions and were not previously vaccinated. Pneumococcal polysaccharide (PPSV23) vaccine  You may need one or two doses if you smoke cigarettes or if you have certain conditions. Meningococcal conjugate (MenACWY) vaccine  You may need this if you have certain conditions. Hepatitis A vaccine  You may need this if you have certain conditions or if you travel or work in places where you may be exposed to hepatitis A. Hepatitis B vaccine  You may need this if you have certain conditions or if you travel or work in places where you may be exposed to hepatitis B. Haemophilus influenzae type b (Hib) vaccine  You may need this if you have certain conditions. Human papillomavirus (HPV) vaccine  If recommended by your health care provider, you may need three doses over 6  months. You may receive vaccines as individual doses or as more than one vaccine together in one shot (combination vaccines). Talk with your health care provider about the risks and benefits of combination vaccines. What tests do I need? Blood tests  Lipid and cholesterol levels. These may be checked every 5 years, or more frequently if you are over 63 years old.  Hepatitis C test.  Hepatitis B test. Screening  Lung cancer screening. You may have this screening every year starting at age 36 if you have a  30-pack-year history of smoking and currently smoke or have quit within the past 15 years.  Colorectal cancer screening. All adults should have this screening starting at age 16 and continuing until age 51. Your health care provider may recommend screening at age 46 if you are at increased risk. You will have tests every 1-10 years, depending on your results and the type of screening test.  Diabetes screening. This is done by checking your blood sugar (glucose) after you have not eaten for a while (fasting). You may have this done every 1-3 years.  Mammogram. This may be done every 1-2 years. Talk with your health care provider about when you should start having regular mammograms. This may depend on whether you have a family history of breast cancer.  BRCA-related cancer screening. This may be done if you have a family history of breast, ovarian, tubal, or peritoneal cancers.  Pelvic exam and Pap test. This may be done every 3 years starting at age 40. Starting at age 67, this may be done every 5 years if you have a Pap test in combination with an HPV test. Other tests  Sexually transmitted disease (STD) testing.  Bone density scan. This is done to screen for osteoporosis. You may have this scan if you are at high risk for osteoporosis. Follow these instructions at home: Eating and drinking  Eat a diet that includes fresh fruits and vegetables, whole grains, lean protein, and low-fat dairy.  Take vitamin and mineral supplements as recommended by your health care provider.  Do not drink alcohol if: ? Your health care provider tells you not to drink. ? You are pregnant, may be pregnant, or are planning to become pregnant.  If you drink alcohol: ? Limit how much you have to 0-1 drink a day. ? Be aware of how much alcohol is in your drink. In the [place] one drink equals one 12 oz bottle of beer (355 mL), one 5 oz glass of wine (148 mL), or one 1 oz glass of hard liquor (44  mL). Lifestyle  Take daily care of your teeth and gums.  Stay active. Exercise for at least 30 minutes on 5 or more days each week.  Do not use any products that contain nicotine or tobacco, such as cigarettes, e-cigarettes, and chewing tobacco. If you need help quitting, ask your health care provider.  If you are sexually active, practice safe sex. Use a condom or other form of birth control (contraception) in order to prevent pregnancy and STIs (sexually transmitted infections).  If told by your health care provider, take low-dose aspirin daily starting at age 69. What's next?  Visit your health care provider once a year for a well check visit.  Ask your health care provider how often you should have your eyes and teeth checked.  Stay up to date on all vaccines. This information is not intended to replace advice given to you by your health care provider. Make  you discuss any questions you have with your health care provider. Document Revised: 04/10/2018 Document Reviewed: 04/10/2018 Elsevier Patient Education  2020 Elsevier Inc.  

## 2019-11-09 NOTE — Assessment & Plan Note (Signed)
Poorly controlled.  Recommend increasing melatonin to 10 mg nightly.  Also recommended 25 to 50 mg of Benadryl nightly as needed.

## 2019-11-11 ENCOUNTER — Encounter: Payer: Self-pay | Admitting: Family Medicine

## 2019-11-11 NOTE — Telephone Encounter (Signed)
Message response did not link to original message. Below is the response from the patient:  No I don't really have any symptoms because usually I am doing something. Sometimes I can tell it's beating fast. I have always had palpitations but it's usually when it's lower and I have worn a heart monitor several times and those have been fine. The only way I have noticed is from my [employer]. Because now it keeps up with it.   Some days...one or two a month the Apple Watch shows my heart rate at 41 for a moment so I am not sure what's going on. Sometimes it is in the 50s also. But again not much. Most of the time it's between 60 to 100 except when I said in the first email.   Will route to MD for review.

## 2019-11-18 ENCOUNTER — Telehealth: Payer: Self-pay | Admitting: Family Medicine

## 2019-11-18 NOTE — Telephone Encounter (Signed)
ROI faxed to [facility] 4/7/21fbg

## 2019-11-18 NOTE — Telephone Encounter (Signed)
ROI faxed to [facility] 4/7/21fbg

## 2019-11-23 NOTE — Telephone Encounter (Signed)
Recv'd records from [facility] forwarded 11 pages to Dr. Dimas Chyle 4/12/21fbg

## 2020-01-12 ENCOUNTER — Other Ambulatory Visit: Payer: Self-pay | Admitting: Obstetrics and Gynecology

## 2020-01-12 DIAGNOSIS — N6002 Solitary cyst of left breast: Secondary | ICD-10-CM

## 2020-01-12 DIAGNOSIS — N6001 Solitary cyst of right breast: Secondary | ICD-10-CM

## 2020-01-12 HISTORY — PX: BREAST CYST ASPIRATION: SHX578

## 2020-01-15 ENCOUNTER — Other Ambulatory Visit: Payer: Self-pay | Admitting: Cardiology

## 2020-01-29 ENCOUNTER — Other Ambulatory Visit: Payer: Self-pay

## 2020-01-29 ENCOUNTER — Ambulatory Visit
Admission: RE | Admit: 2020-01-29 | Discharge: 2020-01-29 | Disposition: A | Discharge status: Home / Self Care | Payer: BC Managed Care – PPO | Source: Ambulatory Visit | Attending: Obstetrics and Gynecology | Admitting: Obstetrics and Gynecology

## 2020-01-29 ENCOUNTER — Other Ambulatory Visit: Payer: Self-pay | Admitting: Obstetrics and Gynecology

## 2020-01-29 DIAGNOSIS — N6002 Solitary cyst of left breast: Secondary | ICD-10-CM

## 2020-01-29 DIAGNOSIS — N6001 Solitary cyst of right breast: Secondary | ICD-10-CM

## 2020-02-09 ENCOUNTER — Ambulatory Visit
Admission: RE | Admit: 2020-02-09 | Discharge: 2020-02-09 | Disposition: A | Discharge status: Home / Self Care | Payer: BC Managed Care – PPO | Source: Ambulatory Visit | Attending: Obstetrics and Gynecology | Admitting: Obstetrics and Gynecology

## 2020-02-09 ENCOUNTER — Other Ambulatory Visit: Payer: Self-pay

## 2020-02-09 DIAGNOSIS — N6001 Solitary cyst of right breast: Secondary | ICD-10-CM

## 2020-02-09 DIAGNOSIS — N6002 Solitary cyst of left breast: Secondary | ICD-10-CM

## 2020-03-03 ENCOUNTER — Encounter: Payer: Self-pay | Admitting: Family Medicine

## 2020-04-13 ENCOUNTER — Other Ambulatory Visit: Payer: Self-pay | Admitting: Cardiology

## 2020-06-10 ENCOUNTER — Other Ambulatory Visit: Payer: Self-pay

## 2020-06-10 ENCOUNTER — Encounter: Payer: Self-pay | Admitting: Family Medicine

## 2020-06-10 ENCOUNTER — Ambulatory Visit (INDEPENDENT_AMBULATORY_CARE_PROVIDER_SITE_OTHER): Payer: BC Managed Care – PPO | Admitting: Family Medicine

## 2020-06-10 VITALS — BP 125/78 | HR 85 | Temp 98.3°F | Ht 64.0 in | Wt 143.0 lb

## 2020-06-10 DIAGNOSIS — F410 Panic disorder [episodic paroxysmal anxiety] without agoraphobia: Secondary | ICD-10-CM | POA: Diagnosis not present

## 2020-06-10 DIAGNOSIS — I10 Essential (primary) hypertension: Secondary | ICD-10-CM

## 2020-06-10 DIAGNOSIS — D1722 Benign lipomatous neoplasm of skin and subcutaneous tissue of left arm: Secondary | ICD-10-CM

## 2020-06-10 DIAGNOSIS — I159 Secondary hypertension, unspecified: Secondary | ICD-10-CM

## 2020-06-10 DIAGNOSIS — D179 Benign lipomatous neoplasm, unspecified: Secondary | ICD-10-CM | POA: Insufficient documentation

## 2020-06-10 MED ORDER — LORAZEPAM 0.5 MG PO TABS: 0.2500 mg | ORAL_TABLET | Freq: Every day | ORAL | 0 refills | Status: DC | PRN

## 2020-06-10 NOTE — Assessment & Plan Note (Signed)
Mass on left upper shoulder consistent with lipoma.  Given that symptoms have not significantly changed over the past year or more we will continue with watchful waiting.  We will get ultrasound if becomes symptomatic.

## 2020-06-10 NOTE — Assessment & Plan Note (Signed)
Worsen.  Discussed treatment options.  She has done well with Ativan in the past.  Will restart today at 0.25mg  daily as needed.  Discussed with patient it she has did use this medication more than a few times per month may benefit from daily medication such as SSRI.  She agreed with this plan.  Offered referral to behavioral health however she declined.  She will check in with me in a month on MyChart.

## 2020-06-10 NOTE — Assessment & Plan Note (Signed)
At goal.  Continue metoprolol succinate 50 mg twice daily.

## 2020-06-10 NOTE — Progress Notes (Signed)
   Caroline Peterson is a 44 y.o. female who presents today for an office visit.  Assessment/Plan:  Chronic Problems Addressed Today: Panic attacks Worsen.  Discussed treatment options.  She has done well with Ativan in the past.  Will restart today at 0.25mg  daily as needed.  Discussed with patient it she has did use this medication more than a few times per month may benefit from daily medication such as SSRI.  She agreed with this plan.  Offered referral to behavioral health however she declined.  She will check in with me in a month on MyChart.  Hypertension At goal.  Continue metoprolol succinate 50 mg twice daily.  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  Given that symptoms have not significantly changed over the past year or more we will continue with watchful waiting.  We will get ultrasound if becomes symptomatic.     Subjective:  HPI:  Patient here with worsening anxiety and panic attacks.  She has been dealing with panic attacks for the past 10 years or so after birth of her daughter.  Has been on Xanax and Ativan in the past.  Both of these worked well.  Symptoms seem to have worsened recently.  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  Her husband was also recently diagnosed with a brain mass and is currently undergoing evaluation which does been particularly stressful.  She also has a lump on her left shoulder she would like to have looked at.  Has been there for years.  Mild pain.  Does not think it is changed significantly. .        Objective:  Physical Exam: BP 125/78   Pulse 85   Temp 98.3 F (36.8 C) (Temporal)   Ht 5\' 4"  (1.626 m)   Wt 143 lb (64.9 kg)   SpO2 99%   BMI 24.55 kg/m   Gen: No acute distress, resting comfortably CV: Regular rate and rhythm with no murmurs appreciated Pulm: Normal work of breathing, clear to auscultation bilaterally with no crackles, wheezes, or rhonchi MSK: Fully mobile  2 to 3 cm mass on left lateral shoulder. Neuro: Grossly normal, moves all extremities Psych: Normal affect and thought content      Caroline Peterson M. Jerline Pain, MD 06/10/2020 10:02 AM

## 2020-06-10 NOTE — Patient Instructions (Signed)
It was very nice to see you today!  I will send in.  Please let me know if you have any take multiple times per week.  Please send a message in about a month or so to let me know how it is working for you.  Take care, Dr Jerline Pain  Please try these tips to maintain a healthy lifestyle:   Eat at least 3 REAL meals and 1-2 snacks per day.  Aim for no more than 5 hours between eating.  If you eat breakfast, please do so within one hour of getting up.    Each meal should contain half fruits/vegetables, one quarter protein, and one quarter carbs (no bigger than a computer mouse)   Cut down on sweet beverages. This includes juice, soda, and sweet tea.     Drink at least 1 glass of water with each meal and aim for at least 8 glasses per day   Exercise at least 150 minutes every week.

## 2020-09-02 ENCOUNTER — Ambulatory Visit: Payer: Federal, State, Local not specified - PPO | Admitting: Cardiology

## 2020-09-19 ENCOUNTER — Telehealth (INDEPENDENT_AMBULATORY_CARE_PROVIDER_SITE_OTHER): Payer: BC Managed Care – PPO | Admitting: Family Medicine

## 2020-09-19 VITALS — Temp 98.9°F | Ht 64.0 in | Wt 146.0 lb

## 2020-09-19 DIAGNOSIS — H9190 Unspecified hearing loss, unspecified ear: Secondary | ICD-10-CM | POA: Diagnosis not present

## 2020-09-19 DIAGNOSIS — J309 Allergic rhinitis, unspecified: Secondary | ICD-10-CM

## 2020-09-19 MED ORDER — MONTELUKAST SODIUM 10 MG PO TABS: 10.0000 mg | ORAL_TABLET | Freq: Every day | ORAL | 3 refills | Status: DC

## 2020-09-19 MED ORDER — AZELASTINE HCL 0.1 % NA SOLN: 2.0000 | Freq: Two times a day (BID) | NASAL | 12 refills | Status: DC

## 2020-09-19 NOTE — Progress Notes (Signed)
   Caroline Peterson is a 45 y.o. female who presents today for a virtual office visit.  Assessment/Plan:  New/Acute Problems: Eustachian Tube Dysfunction Discussed limitations of virtual visit and inability to perform physical exam virtually.  Symptoms concerning for possible eustachian tube dysfunction given her ear popping and ear pressure.  Likely secondary to worsened allergic rhinitis.  We will start Astelin and Singulair.  She will let me know if not improving over the next few days.  Chronic Problems Addressed Today: Hearing loss Along with audiology and ENT.  Symptoms are overall stable..  Allergic rhinitis Worsened.  Likely contributing to her presumed eustachian tube dysfunction will start Astelin and Singulair.  She will let me know if not improving.    Subjective:  HPI:  With increased nasal congestion, dizziness, and ear pressure for the last couple of weeks.  Symptoms have been going on for the last couple of weeks but it worsened over the last day.  She has tried taking Antivert with modest improvement.  Dizziness described as a room spinning sensation.  She has been taking Allegra.  Her nasal congestion seems to be getting worse.  Feels a lot of pressure both of her ears.  No fevers or chills.  She has a history of hearing loss and is following with ENT and audiologist for this.  She feels like her ear is clogged.       Objective/Observations  Physical Exam: Gen: NAD, resting comfortably Pulm: Normal work of breathing Neuro: Grossly normal, moves all extremities Psych: Normal affect and thought content  Virtual Visit via Video   I connected with Keora Eccleston on 09/19/20 at  3:40 PM EST by a video enabled telemedicine application and verified that I am speaking with the correct person using two identifiers. The limitations of evaluation and management by telemedicine and the availability of in person appointments were discussed. The  patient expressed understanding and agreed to proceed.   Patient location: Home Provider location: [place] participating in the virtual visit: Myself and Patient     Algis Greenhouse. Jerline Pain, MD 09/19/2020 4:03 PM

## 2020-09-19 NOTE — Assessment & Plan Note (Signed)
Along with audiology and ENT.  Symptoms are overall stable.Caroline Peterson

## 2020-09-19 NOTE — Assessment & Plan Note (Signed)
Worsened.  Likely contributing to her presumed eustachian tube dysfunction will start Astelin and Singulair.  She will let me know if not improving.

## 2020-10-05 DIAGNOSIS — R5383 Other fatigue: Secondary | ICD-10-CM | POA: Insufficient documentation

## 2020-10-05 NOTE — Progress Notes (Signed)
Cardiology Office Note   Date:  10/06/2020   ID:  [place] Behlke, DOB [birth year], MRN [id number]  PCP:  Vivi Barrack, MD  Cardiologist:   No primary care provider on file.   Chief Complaint  Patient presents with  . Palpitations      History of Present Illness: Caroline Peterson is a 45 y.o. female who presents for follow up of palpitations.   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  But she is having fewer palpitations.  She did have 1 day of increased palpitations that happened a few weeks ago when evening.  However, otherwise has been better than it had previously.  Her blood pressure has been well controlled.The patient denies any new symptoms such as chest discomfort, neck or arm discomfort. There has been no new shortness of breath, PND or orthopnea. There have been no reported presyncope or syncope.    Past Medical History:  Diagnosis Date  . HELLP (hemolytic anemia/elev liver enzymes/low platelets in pregnancy)   . Hypertension   . 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 CESAREAN SECTION    . DILATION AND CURETTAGE OF UTERUS    . EYE SURGERY Bilateral 2019   Laser peripheral iridotomy  . EYE SURGERY Left 2018   Laser peripheral iridotomy   . MOUTH SURGERY  1994   wisdom teeth   . TONSILLECTOMY AND ADENOIDECTOMY       Current Outpatient Medications  Medication Sig Dispense Refill  . azelastine (ASTELIN) 0.1 % nasal spray Place 2 sprays into both nostrils 2 (two) times daily. 30 mL 12  . LORazepam (ATIVAN) 0.5 MG tablet Take 0.5 tablets (0.25 mg total) by mouth daily as needed for anxiety. 30 tablet 0  . MELATONIN PO Take by mouth.    . metoprolol succinate (TOPROL-XL) 50 MG 24 hr tablet TAKE 1 TABLET BY MOUTH TWICE DAILY WITH OR IMMEDIATELY FOLLOWING A MEAL 180 tablet 1  . montelukast (SINGULAIR) 10 MG tablet Take 1 tablet  (10 mg total) by mouth at bedtime. 30 tablet 3  . Multiple Vitamin (MULTIVITAMIN) tablet Take 1 tablet by mouth daily.    . valACYclovir (VALTREX) 500 MG tablet Take 500 mg by mouth daily.     No current facility-administered medications for this visit.    Allergies:   Aspirin, Erythromycin, Nitrofurantoin, and Azithromycin    ROS:  Please see the history of present illness.   Otherwise, review of systems are positive for none.   All other systems are reviewed and negative.    PHYSICAL EXAM: VS:  BP 124/84   Pulse 68   Ht 5\' 4"  (1.626 m)   Wt 149 lb (67.6 kg)   SpO2 97%   BMI 25.58 kg/m  , BMI Body mass index is 25.58 kg/m. GENERAL:  Well appearing NECK:  No jugular venous distention, waveform within normal limits, carotid upstroke brisk and symmetric, no bruits, no thyromegaly LUNGS:  Clear to auscultation bilaterally CHEST:  Unremarkable HEART:  PMI not displaced or sustained,S1 and S2 within normal limits, no S3, no S4, no clicks, no rubs, no murmurs ABD:  Flat, positive bowel sounds normal in frequency in pitch, no bruits, no rebound, no guarding, no midline pulsatile mass, no hepatomegaly, no splenomegaly EXT:  2 plus pulses throughout, no edema, no cyanosis no clubbing  EKG:  EKG  is  ordered today. The ekg ordered today demonstrates sinus rhythm, rate 68 axis within normal limits, intervals within normal limits, no acute ST-T wave changes.   Recent Labs: No results found for requested labs within last 8760 hours.    Lipid Panel No results found for: CHOL, TRIG, HDL, CHOLHDL, VLDL, LDLCALC, LDLDIRECT    Wt Readings from Last 3 Encounters:  10/06/20 149 lb (67.6 kg)  09/19/20 146 lb (66.2 kg)  06/10/20 143 lb (64.9 kg)      Other studies Reviewed: Additional studies/ records that were reviewed today include: none. Review of the above records demonstrates:  Please see elsewhere in the note.     ASSESSMENT AND PLAN:  Palpitations - She had one episode of  these as described above but otherwise is doing better.  We talked about taking an extra half dose of metoprolol if she has any increased symptoms and then letting me know.  Her thyroid last year electrolytes were unremarkable.  I will defer to her primary physician any follow-up labs.   Mitral regurgitation - I do not hear this.  I will follow this clinically.  No further imaging.   HTN - Her blood pressure was elevated but it is much improved.  No change in therapy.    Current medicines are reviewed at length with the patient today.  The patient does not have concerns regarding medicines.  The following changes have been made:  no change  Labs/ tests ordered today include:   Orders Placed This Encounter  Procedures  . EKG 12-Lead     Disposition:   FU with me in one year    Signed, Minus Breeding, MD  10/06/2020 12:16 PM    [place]

## 2020-10-06 ENCOUNTER — Ambulatory Visit (INDEPENDENT_AMBULATORY_CARE_PROVIDER_SITE_OTHER): Payer: BC Managed Care – PPO | Admitting: Cardiology

## 2020-10-06 ENCOUNTER — Other Ambulatory Visit: Payer: Self-pay

## 2020-10-06 ENCOUNTER — Encounter: Payer: Self-pay | Admitting: Cardiology

## 2020-10-06 VITALS — BP 124/84 | HR 68 | Ht 64.0 in | Wt 149.0 lb

## 2020-10-06 DIAGNOSIS — R5383 Other fatigue: Secondary | ICD-10-CM

## 2020-10-06 DIAGNOSIS — I340 Nonrheumatic mitral (valve) insufficiency: Secondary | ICD-10-CM | POA: Diagnosis not present

## 2020-10-06 DIAGNOSIS — R002 Palpitations: Secondary | ICD-10-CM | POA: Diagnosis not present

## 2020-10-06 DIAGNOSIS — I10 Essential (primary) hypertension: Secondary | ICD-10-CM

## 2020-10-06 NOTE — Patient Instructions (Signed)
Medication Instructions:  No changes *If you need a refill on your cardiac medications before your next appointment, please call your pharmacy*  Follow-Up: At [facility], you and your health needs are our priority.  As part of our continuing mission to provide you with exceptional heart care, we have created designated Provider Care Teams.  These Care Teams include your primary Cardiologist (physician) and Advanced Practice Providers (APPs -  Physician Assistants and Nurse Practitioners) who all work together to provide you with the care you need, when you need it.  Your next appointment:   12 month(s) You will receive a reminder letter in the mail two months in advance. If you don't receive a letter, please call our office to schedule the follow-up appointment.  The format for your next appointment:   In Person  Provider:   James Hochrein, MD   

## 2021-02-05 ENCOUNTER — Encounter: Payer: Self-pay | Admitting: Family Medicine

## 2021-02-06 NOTE — Telephone Encounter (Signed)
Please see message and advise 

## 2021-02-09 ENCOUNTER — Encounter: Payer: Self-pay | Admitting: Family Medicine

## 2021-02-10 ENCOUNTER — Telehealth: Payer: BC Managed Care – PPO | Admitting: Physician Assistant

## 2021-02-10 DIAGNOSIS — U071 COVID-19: Secondary | ICD-10-CM | POA: Diagnosis not present

## 2021-02-10 NOTE — Patient Instructions (Signed)
  [name], thank you for joining Mar Daring, PA-C for today's virtual visit.  While this provider is not your primary care provider (PCP), if your PCP is located in our provider database this encounter information will be shared with them immediately following your visit.  Consent: (Patient) Caroline Peterson provided verbal consent for this virtual visit at the beginning of the encounter.  Current Medications:  Current Outpatient Medications:    azelastine (ASTELIN) 0.1 % nasal spray, Place 2 sprays into both nostrils 2 (two) times daily., Disp: 30 mL, Rfl: 12   LORazepam (ATIVAN) 0.5 MG tablet, Take 0.5 tablets (0.25 mg total) by mouth daily as needed for anxiety., Disp: 30 tablet, Rfl: 0   MELATONIN PO, Take by mouth., Disp: , Rfl:    metoprolol succinate (TOPROL-XL) 50 MG 24 hr tablet, TAKE 1 TABLET BY MOUTH TWICE DAILY WITH OR IMMEDIATELY FOLLOWING A MEAL, Disp: 180 tablet, Rfl: 1   montelukast (SINGULAIR) 10 MG tablet, Take 1 tablet (10 mg total) by mouth at bedtime., Disp: 30 tablet, Rfl: 3   Multiple Vitamin (MULTIVITAMIN) tablet, Take 1 tablet by mouth daily., Disp: , Rfl:    valACYclovir (VALTREX) 500 MG tablet, Take 500 mg by mouth daily., Disp: , Rfl:    Medications ordered in this encounter:  No orders of the defined types were placed in this encounter.    *If you need refills on other medications prior to your next appointment, please contact your pharmacy*  Follow-Up: Call back or seek an in-person evaluation if the symptoms worsen or if the condition fails to improve as anticipated.  Other Instructions Monitor for return or worsening fever, chills, body aches, shortness of breath, cough, or sinus pain/pressure If any occur please reach out either to [place] or to your PCP   If you have been instructed to have an in-person evaluation today at a local Urgent Care facility, please use the link below. It will take you to a list of  all of our available [facility], including address, phone number and hours of operation. Please do not delay care.  [facility]  If you or a family member do not have a primary care provider, use the link below to schedule a visit and establish care. When you choose a [facility] primary care physician or advanced practice provider, you gain a long-term partner in health. Find a Primary Care Provider  Learn more about [facility]'s in-office and virtual care options: [facility]

## 2021-02-10 NOTE — Progress Notes (Signed)
Ms. Caroline, Peterson are scheduled for a virtual visit with your provider today.    Just as we do with appointments in the office, we must obtain your consent to participate.  Your consent will be active for this visit and any virtual visit you may have with one of our providers in the next 365 days.    If you have a MyChart account, I can also send a copy of this consent to you electronically.  All virtual visits are billed to your insurance company just like a traditional visit in the office.  As this is a virtual visit, video technology does not allow for your provider to perform a traditional examination.  This may limit your provider's ability to fully assess your condition.  If your provider identifies any concerns that need to be evaluated in person or the need to arrange testing such as labs, EKG, etc, we will make arrangements to do so.    Although advances in technology are sophisticated, we cannot ensure that it will always work on either your end or our end.  If the connection with a video visit is poor, we may have to switch to a telephone visit.  With either a video or telephone visit, we are not always able to ensure that we have a secure connection.   I need to obtain your verbal consent now.   Are you willing to proceed with your visit today?   Caroline Peterson has provided verbal consent on 02/10/2021 for a virtual visit (video or telephone).   Mar Daring, PA-C 02/10/2021  2:08 PM  Virtual Visit Consent   Caroline Peterson, you are scheduled for a virtual visit with a [facility] provider today.     Just as with appointments in the office, your consent must be obtained to participate.  Your consent will be active for this visit and any virtual visit you may have with one of our providers in the next 365 days.     If you have a MyChart account, a copy of this consent can be sent to you electronically.  All virtual visits are billed to your insurance  company just like a traditional visit in the office.    As this is a virtual visit, video technology does not allow for your provider to perform a traditional examination.  This may limit your provider's ability to fully assess your condition.  If your provider identifies any concerns that need to be evaluated in person or the need to arrange testing (such as labs, EKG, etc.), we will make arrangements to do so.     Although advances in technology are sophisticated, we cannot ensure that it will always work on either your end or our end.  If the connection with a video visit is poor, the visit may have to be switched to a telephone visit.  With either a video or telephone visit, we are not always able to ensure that we have a secure connection.     I need to obtain your verbal consent now.   Are you willing to proceed with your visit today?    Caroline Peterson has provided verbal consent on 02/10/2021 for a virtual visit (video or telephone).   Mar Daring, PA-C   Date: 02/10/2021 2:08 PM   Virtual Visit via Video Note   I, Mar Daring, connected with  Caroline Peterson  ([id number], 1976/07/24) on 02/10/21 at  2:00 PM EDT by  a video-enabled telemedicine application and verified that I am speaking with the correct person using two identifiers.  Location: Patient: Virtual Visit Location Patient: Home Provider: Virtual Visit Location Provider: Home Office   I discussed the limitations of evaluation and management by telemedicine and the availability of in person appointments. The patient expressed understanding and agreed to proceed.    History of Present Illness: Caroline Peterson is a 45 y.o. who identifies as a female who was assigned female at birth, and is being seen today for Covid 79. She tested positive on Sunday, 02/05/21. Symptoms started on 02/02/21. She is currently on Paxlovid and has one more dose left. She does report that  overall she is feeling much better today. She has started to have some discolored mucous when she blows her nose and wanted to make sure she did not have a secondary infection. She reports did test negative today for Covid 19. She has symptom improvement in the fatigue, body aches, head pressure, nasal congestion.    Problems:  Patient Active Problem List   Diagnosis Date Noted   Other fatigue 10/05/2020   Hearing loss 09/19/2020   Lipoma 06/10/2020   Allergic rhinitis 11/09/2019   Insomnia 11/09/2019   Cold sore 11/09/2019   Osteoarthritis 11/09/2019   Eye abnormality 11/09/2019   Panic attacks 04/09/2019   Palpitation 02/17/2013   Hypertension 11/22/2010    Allergies:  Allergies  Allergen Reactions   Aspirin Nausea And Vomiting    "can take coated aspirin"   Erythromycin Nausea And Vomiting   Nitrofurantoin Other (See Comments)    Unknown    Azithromycin Nausea And Vomiting and Palpitations    "made my heart speed up"   Medications:  Current Outpatient Medications:    azelastine (ASTELIN) 0.1 % nasal spray, Place 2 sprays into both nostrils 2 (two) times daily., Disp: 30 mL, Rfl: 12   LORazepam (ATIVAN) 0.5 MG tablet, Take 0.5 tablets (0.25 mg total) by mouth daily as needed for anxiety., Disp: 30 tablet, Rfl: 0   MELATONIN PO, Take by mouth., Disp: , Rfl:    metoprolol succinate (TOPROL-XL) 50 MG 24 hr tablet, TAKE 1 TABLET BY MOUTH TWICE DAILY WITH OR IMMEDIATELY FOLLOWING A MEAL, Disp: 180 tablet, Rfl: 1   montelukast (SINGULAIR) 10 MG tablet, Take 1 tablet (10 mg total) by mouth at bedtime., Disp: 30 tablet, Rfl: 3   Multiple Vitamin (MULTIVITAMIN) tablet, Take 1 tablet by mouth daily., Disp: , Rfl:    valACYclovir (VALTREX) 500 MG tablet, Take 500 mg by mouth daily., Disp: , Rfl:   Observations/Objective: Patient is well-developed, well-nourished in no acute distress.  Resting comfortably at home.  Head is normocephalic, atraumatic.  No labored breathing. Speech is  clear and coherent with logical content.  Patient is alert and oriented at baseline.   Assessment and Plan: 1. COVID-19 - Symptoms improving - Suspect discharge is older mucous she is now able to get out - Discussed second sickening and signs and symptoms of this; if this occurs she is to call her PCP or reach back out to the digital care platform.  Follow Up Instructions: I discussed the assessment and treatment plan with the patient. The patient was provided an opportunity to ask questions and all were answered. The patient agreed with the plan and demonstrated an understanding of the instructions.  A copy of instructions were sent to the patient via MyChart.  The patient was advised to call back or seek an in-person evaluation if  the symptoms worsen or if the condition fails to improve as anticipated.  Time:  I spent 9 minutes with the patient via telehealth technology discussing the above problems/concerns.    Mar Daring, PA-C

## 2021-02-14 NOTE — Telephone Encounter (Signed)
Dr. Jerline Pain, pt is feeling better and dizziness is gone. Completed Paxlovid medication and tested negative today.

## 2021-02-14 NOTE — Telephone Encounter (Signed)
Spoke to pt told her following up on message, apologized we were not in the office and message do not go to provider until we see them. Asked her how she was feeling? Dr. Jerline Pain said dizziness is not a typical side effect of Paxlovid. Pt said is feeling better, negative test today and dizziness is gone, finished medication. Told pt glad feeling better.

## 2021-02-28 ENCOUNTER — Other Ambulatory Visit: Payer: Self-pay

## 2021-02-28 ENCOUNTER — Ambulatory Visit (INDEPENDENT_AMBULATORY_CARE_PROVIDER_SITE_OTHER): Payer: BC Managed Care – PPO | Admitting: Family Medicine

## 2021-02-28 ENCOUNTER — Encounter: Payer: Self-pay | Admitting: Family Medicine

## 2021-02-28 VITALS — BP 122/80 | HR 68 | Temp 98.0°F | Ht 64.0 in | Wt 148.4 lb

## 2021-02-28 DIAGNOSIS — Z1211 Encounter for screening for malignant neoplasm of colon: Secondary | ICD-10-CM

## 2021-02-28 DIAGNOSIS — H9190 Unspecified hearing loss, unspecified ear: Secondary | ICD-10-CM

## 2021-02-28 DIAGNOSIS — Z13220 Encounter for screening for lipoid disorders: Secondary | ICD-10-CM | POA: Diagnosis not present

## 2021-02-28 DIAGNOSIS — I159 Secondary hypertension, unspecified: Secondary | ICD-10-CM | POA: Diagnosis not present

## 2021-02-28 DIAGNOSIS — R739 Hyperglycemia, unspecified: Secondary | ICD-10-CM

## 2021-02-28 DIAGNOSIS — F410 Panic disorder [episodic paroxysmal anxiety] without agoraphobia: Secondary | ICD-10-CM | POA: Diagnosis not present

## 2021-02-28 DIAGNOSIS — Z0001 Encounter for general adult medical examination with abnormal findings: Secondary | ICD-10-CM

## 2021-02-28 DIAGNOSIS — J309 Allergic rhinitis, unspecified: Secondary | ICD-10-CM

## 2021-02-28 DIAGNOSIS — E785 Hyperlipidemia, unspecified: Secondary | ICD-10-CM | POA: Insufficient documentation

## 2021-02-28 DIAGNOSIS — G4700 Insomnia, unspecified: Secondary | ICD-10-CM

## 2021-02-28 LAB — CBC
HCT: 35.8 % — ABNORMAL LOW (ref 36.0–46.0)
Hemoglobin: 12.6 g/dL (ref 12.0–15.0)
MCHC: 35.2 g/dL (ref 30.0–36.0)
MCV: 87 fl (ref 78.0–100.0)
Platelets: 217 10*3/uL (ref 150.0–400.0)
RBC: 4.12 Mil/uL (ref 3.87–5.11)
RDW: 13.6 % (ref 11.5–15.5)
WBC: 4.9 10*3/uL (ref 4.0–10.5)

## 2021-02-28 LAB — COMPREHENSIVE METABOLIC PANEL
ALT: 14 U/L (ref 0–35)
AST: 15 U/L (ref 0–37)
Albumin: 4.2 g/dL (ref 3.5–5.2)
Alkaline Phosphatase: 87 U/L (ref 39–117)
BUN: 13 mg/dL (ref 6–23)
CO2: 26 mEq/L (ref 19–32)
Calcium: 8.6 mg/dL (ref 8.4–10.5)
Chloride: 104 mEq/L (ref 96–112)
Creatinine, Ser: 0.58 mg/dL (ref 0.40–1.20)
GFR: 108.79 mL/min (ref >60.00)
Glucose, Bld: 95 mg/dL (ref 70–99)
Potassium: 3.8 mEq/L (ref 3.5–5.1)
Sodium: 138 mEq/L (ref 135–145)
Total Bilirubin: 0.7 mg/dL (ref 0.2–1.2)
Total Protein: 7 g/dL (ref 6.0–8.3)

## 2021-02-28 LAB — LIPID PANEL
Cholesterol: 189 mg/dL (ref 0–200)
HDL: 46.2 mg/dL
LDL Cholesterol: 121 mg/dL — ABNORMAL HIGH (ref 0–99)
NonHDL: 143.11
Total CHOL/HDL Ratio: 4
Triglycerides: 112 mg/dL (ref 0.0–149.0)
VLDL: 22.4 mg/dL (ref 0.0–40.0)

## 2021-02-28 LAB — HEMOGLOBIN A1C: Hgb A1c MFr Bld: 5.2 % (ref 4.6–6.5)

## 2021-02-28 LAB — TSH: TSH: 0.95 u[IU]/mL (ref 0.35–5.50)

## 2021-02-28 MED ORDER — BUSPIRONE HCL 5 MG PO TABS: 5.0000 mg | ORAL_TABLET | Freq: Two times a day (BID) | ORAL | 3 refills | Status: DC

## 2021-02-28 MED ORDER — VALACYCLOVIR HCL 500 MG PO TABS: 500.0000 mg | ORAL_TABLET | Freq: Every day | ORAL | 3 refills | Status: DC

## 2021-02-28 MED ORDER — HYDROCORTISONE-ACETIC ACID 1-2 % OT SOLN: 5.0000 [drp] | Freq: Two times a day (BID) | OTIC | 0 refills | Status: DC

## 2021-02-28 NOTE — Progress Notes (Signed)
Chief Complaint:  Caroline Peterson is a 45 y.o. female who presents today for her annual comprehensive physical exam.    Assessment/Plan:  New/Acute Problems: Ear canal irritation Start acetic acid-hydrocortisone drops.  Recommended she follow-up with ENT soon.  Chronic Problems Addressed Today: Hearing loss Follows with cardiology and ENT.  Has some eustachian tube dysfunction which may be contributing.  Insomnia Overall stable on melatonin.  Allergic rhinitis Has not been able to tolerate nasal antihistamines or nasal steroids.  Also has not been able to tolerate standard doses of over-the-counter antihistamines due to drowsiness.  This is probably contributing some to her eustachian tube dysfunction.  Recommended she take half dose of Xyzal or Allegra every other day and follow-up with ENT soon.  Panic attacks Still has frequent panic attacks.  Discussed treatment options.  We will start BuSpar 5 mg twice daily and she will check in with me in a couple of weeks.  Continue Ativan 0.25 mg daily as needed.  Hypertension At goal on metoprolol succinate 50 mg daily.  Tolerating well.  Check labs today.   Body mass index is 25.47 kg/m. / Overweight  BMI Metric Follow Up - 02/28/21 0856       BMI Metric Follow Up-Please document annually   BMI Metric Follow Up Education provided              Preventative Healthcare: Check labs.  Order Cologuard.  Up-to-date on Pap and mammogram.Up-to-date on vaccines.  Patient Counseling(The following topics were reviewed and/or handout was given):  -Nutrition: Stressed importance of moderation in sodium/caffeine intake, saturated fat and cholesterol, caloric balance, sufficient intake of fresh fruits, vegetables, and fiber.  -Stressed the importance of regular exercise.   -Substance Abuse: Discussed cessation/primary prevention of tobacco, alcohol, or other drug use; driving or other dangerous activities under the  influence; availability of treatment for abuse.   -Injury prevention: Discussed safety belts, safety helmets, smoke detector, smoking near bedding or upholstery.   -Sexuality: Discussed sexually transmitted diseases, partner selection, use of condoms, avoidance of unintended pregnancy and contraceptive alternatives.   -Dental health: Discussed importance of regular tooth brushing, flossing, and dental visits.  -Health maintenance and immunizations reviewed. Please refer to Health maintenance section.  Return to care in 1 year for next preventative visit.     Subjective:  HPI: Today patient has a cc of having continued ear issues. She follows Dr. Thornell Mule at [facility]. Patient reports a cracking sound when she swallows. Her right ear feels clogged all the time - reports itching and white discharge. She did not mention the cracking sound to Dr. Thornell Mule. Also notes that her hearing has worsened. She did take Allegra in the past - did not notice a significant difference - patient is willing to trial this again every other day or half a dose every other day. She has also used Zyrtec in the past.  She does not take Singulair. She did use a nasal spray when she had COVID-19 - avoids this due to feeling drowsy - reports having to sleep during the day. She does report a lingering cough that she still has after having COVID-19.  Still experiences anxiety/panic attacks - chest pain is secondary to this when she has experiences the attacks. Ativan is helpful. Patient reports sporadic episodes may occur- she can not provide a number. Happens during bed time - she takes melatonin at night, but does not take Ativan along with it. She does not want to take any  anxiety medication that will make her gain weight. Buspar was discussed- she does want to trial the medication.  She does have cysts and they do hurt her. She is hoping to have an aspiration done. Did recently have a mammogram recently and have not heard  results. Previous aspirations done at the breast center.  She plans to go to [facility] this upcoming Saturday.  Lifestyle Diet: Does not drink caffeine. Exercise: not addressed. Depression screen PHQ 2/9 02/28/2021  Decreased Interest 0  Down, Depressed, Hopeless 0  PHQ - 2 Score 0  Altered sleeping -  Tired, decreased energy -  Change in appetite -  Feeling bad or failure about yourself  -  Trouble concentrating -  Moving slowly or fidgety/restless -  Suicidal thoughts -  PHQ-9 Score -  Difficult doing work/chores -    Health Maintenance Due  Topic Date Due   Pneumococcal Vaccine 2-36 Years old (1 - PCV) Never done   HIV Screening  Never done   Hepatitis C Screening  Never done   COLONOSCOPY (Pts 45-57yrs Insurance coverage will need to be confirmed)  Never done   COVID-19 Vaccine (4 - Booster for Moderna series) 10/11/2020     ROS: Per HPI, otherwise a complete review of systems was negative.   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    Patient Active Problem List   Diagnosis Date Noted   Other fatigue 10/05/2020   Hearing loss 09/19/2020   Lipoma 06/10/2020   Allergic rhinitis 11/09/2019   Insomnia 11/09/2019   Cold sore 11/09/2019   Osteoarthritis 11/09/2019   Eye abnormality 11/09/2019   Panic attacks 04/09/2019   Palpitation 02/17/2013   Hypertension 11/22/2010   Past Surgical History:  Procedure Laterality Date   CESAREAN SECTION     DILATION AND CURETTAGE OF UTERUS     EYE SURGERY Bilateral 2019   Laser peripheral iridotomy   EYE SURGERY Left 2018   Laser peripheral iridotomy    MOUTH SURGERY  1994   wisdom teeth    TONSILLECTOMY AND ADENOIDECTOMY      Family History  Problem Relation Age of Onset   Hypertension Mother    Hyperlipidemia  Mother    Diabetes Father    Hyperlipidemia Father    Hypertension Father    Kidney disease Father    Hypertension Brother    Cancer Maternal Grandmother    Cancer Maternal Grandfather    Cancer Paternal Grandmother    Heart disease Paternal Grandmother    Heart disease Paternal Grandfather    Hypertension Paternal Grandfather     Medications- reviewed and updated Current Outpatient Medications  Medication Sig Dispense Refill   acetic acid-hydrocortisone (VOSOL-HC) OTIC solution Place 5 drops into both ears 2 (two) times daily. 10 mL 0   azelastine (ASTELIN) 0.1 % nasal spray Place 2 sprays into both nostrils 2 (two) times daily. 30 mL 12   busPIRone (BUSPAR) 5 MG tablet Take 1 tablet (5 mg total) by mouth 2 (two) times daily. 60 tablet 3   LORazepam (ATIVAN) 0.5 MG tablet Take 0.5 tablets (0.25 mg total) by mouth daily as needed for anxiety. 30 tablet 0   MELATONIN PO Take by mouth.     metoprolol succinate (TOPROL-XL) 50 MG 24 hr tablet TAKE 1 TABLET BY  MOUTH TWICE DAILY WITH OR IMMEDIATELY FOLLOWING A MEAL 180 tablet 1   montelukast (SINGULAIR) 10 MG tablet Take 1 tablet (10 mg total) by mouth at bedtime. 30 tablet 3   Multiple Vitamin (MULTIVITAMIN) tablet Take 1 tablet by mouth daily.     valACYclovir (VALTREX) 500 MG tablet Take 1 tablet (500 mg total) by mouth daily. 90 tablet 3   No current facility-administered medications for this visit.    Allergies-reviewed and updated Allergies  Allergen Reactions   Aspirin Nausea And Vomiting    "can take coated aspirin"   Erythromycin Nausea And Vomiting   Nitrofurantoin Other (See Comments)    Unknown    Azithromycin Nausea And Vomiting and Palpitations    "made my heart speed up"    Social History   Socioeconomic History   Marital status: Married    Spouse name: Genee Rann   Number of children: 1   Years of education: Not on file   Highest education level: Not on file  Occupational History    Employer: [employer]  Tobacco Use   Smoking status: Never   Smokeless tobacco: Never  Vaping Use   Vaping Use: Never used  Substance and Sexual Activity   Alcohol use: No   Drug use: No   Sexual activity: Yes    Birth control/protection: Other-see comments  Other Topics Concern   Not on file  Social History Narrative   Lives with husband and daughter.    Social Determinants of Health   Financial Resource Strain: Not on file  Food Insecurity: Not on file  Transportation Needs: Not on file  Physical Activity: Not on file  Stress: Not on file  Social Connections: Not on file        Objective:  Physical Exam: BP 122/80   Pulse 68   Temp 98 F (36.7 C) (Temporal)   Ht 5\' 4"  (1.626 m)   Wt 148 lb 6.4 oz (67.3 kg)   LMP 02/22/2021   SpO2 98%   BMI 25.47 kg/m   Body mass index is 25.47 kg/m. Wt Readings from Last 3 Encounters:  02/28/21 148 lb 6.4 oz (67.3 kg)  10/06/20 149 lb (67.6 kg)  09/19/20 146 lb (66.2 kg)   Gen: NAD, resting comfortably HEENT: TMs normal bilaterally. OP clear. No thyromegaly noted. Fluid build up noted on the right ear. Mild irritation noted on the left ear.  CV: RRR with no murmurs appreciated Pulm: NWOB, CTAB with no crackles, wheezes, or rhonchi GI: Normal bowel sounds present. Soft, Nontender, Nondistended. MSK: no edema, cyanosis, or clubbing noted Skin: warm, dry Neuro: CN2-12 grossly intact. Strength 5/5 in upper and lower extremities. Reflexes symmetric and intact bilaterally.  Psych: Normal affect and thought content     I,Harris Phan,acting as a scribe for Dimas Chyle, MD.,have documented all relevant documentation on the behalf of Dimas Chyle, MD,as directed by  Dimas Chyle, MD while in the presence of Dimas Chyle, MD.  I, Dimas Chyle, MD, have reviewed all documentation for this visit. The documentation on 02/28/21 for the exam, diagnosis, procedures, and orders are all accurate and complete.  Algis Greenhouse. Jerline Pain, MD 02/28/2021 8:57  AM

## 2021-02-28 NOTE — Progress Notes (Signed)
Please inform patient of the following:  Her "bad" cholesterol is abit elevated but everything else is stable. Do not need to start medications but she should continue to work on diet and exercise and we can recheck in a year.  Caroline Peterson. Jerline Pain, MD 02/28/2021 2:18 PM

## 2021-02-28 NOTE — Assessment & Plan Note (Signed)
Overall stable on melatonin.

## 2021-02-28 NOTE — Assessment & Plan Note (Signed)
Follows with cardiology and ENT.  Has some eustachian tube dysfunction which may be contributing.

## 2021-02-28 NOTE — Assessment & Plan Note (Signed)
At goal on metoprolol succinate 50 mg daily.  Tolerating well.  Check labs today.

## 2021-02-28 NOTE — Assessment & Plan Note (Signed)
Has not been able to tolerate nasal antihistamines or nasal steroids.  Also has not been able to tolerate standard doses of over-the-counter antihistamines due to drowsiness.  This is probably contributing some to her eustachian tube dysfunction.  Recommended she take half dose of Xyzal or Allegra every other day and follow-up with ENT soon.

## 2021-02-28 NOTE — Patient Instructions (Signed)
It was very nice to see you today!  We will check blood work today.  I will also order Cologuard for colon cancer screening.  Please try the BuSpar 5 mg twice daily and check in with me in a couple weeks to let me know how this is working.  You have fluid buildup behind your ears.  You can try taking a low-dose over-the-counter allergy medication to see if this helps.  Please see your ear nose and throat doctor soon.  I will also send in the eardrop to help some with your ear canal irritation.  I will see you back in year for your next physical.  Come back to see me sooner if needed.  Take care, Dr Jerline Pain  PLEASE NOTE:  If you had any lab tests please let us know if you have not heard back within a few days. You may see your results on mychart before we have a chance to review them but we will give you a call once they are reviewed by [place]. If we ordered any referrals today, please let us know if you have not heard from their office within the next week.   Please try these tips to maintain a healthy lifestyle:  Eat at least 3 REAL meals and 1-2 snacks per day.  Aim for no more than 5 hours between eating.  If you eat breakfast, please do so within one hour of getting up.   Each meal should contain half fruits/vegetables, one quarter protein, and one quarter carbs (no bigger than a computer mouse)  Cut down on sweet beverages. This includes juice, soda, and sweet tea.   Drink at least 1 glass of water with each meal and aim for at least 8 glasses per day  Exercise at least 150 minutes every week.    Preventive Care 22-28 Years Old, Female Preventive care refers to lifestyle choices and visits with your health care provider that can promote health and wellness. This includes: A yearly physical exam. This is also called an annual wellness visit. Regular dental and eye exams. Immunizations. Screening for certain conditions. Healthy lifestyle choices, such as: Eating a healthy  diet. Getting regular exercise. Not using drugs or products that contain nicotine and tobacco. Limiting alcohol use. What can I expect for my preventive care visit? Physical exam Your health care provider will check your: Height and weight. These may be used to calculate your BMI (body mass index). BMI is a measurement that tells if you are at a healthy weight. Heart rate and blood pressure. Body temperature. Skin for abnormal spots. Counseling Your health care provider may ask you questions about your: Past medical problems. Family's medical history. Alcohol, tobacco, and drug use. Emotional well-being. Home life and relationship well-being. Sexual activity. Diet, exercise, and sleep habits. Work and work Statistician. Access to firearms. Method of birth control. Menstrual cycle. Pregnancy history. What immunizations do I need?  Vaccines are usually given at various ages, according to a schedule. Your health care provider will recommend vaccines for you based on your age, medicalhistory, and lifestyle or other factors, such as travel or where you work. What tests do I need? Blood tests Lipid and cholesterol levels. These may be checked every 5 years, or more often if you are over 72 years old. Hepatitis C test. Hepatitis B test. Screening Lung cancer screening. You may have this screening every year starting at age 80 if you have a 30-pack-year history of smoking and currently smoke or have  quit within the past 15 years. Colorectal cancer screening. All adults should have this screening starting at age 39 and continuing until age 35. Your health care provider may recommend screening at age 37 if you are at increased risk. You will have tests every 1-10 years, depending on your results and the type of screening test. Diabetes screening. This is done by checking your blood sugar (glucose) after you have not eaten for a while (fasting). You may have this done every 1-3  years. Mammogram. This may be done every 1-2 years. Talk with your health care provider about when you should start having regular mammograms. This may depend on whether you have a family history of breast cancer. BRCA-related cancer screening. This may be done if you have a family history of breast, ovarian, tubal, or peritoneal cancers. Pelvic exam and Pap test. This may be done every 3 years starting at age 28. Starting at age 59, this may be done every 5 years if you have a Pap test in combination with an HPV test. Other tests STD (sexually transmitted disease) testing, if you are at risk. Bone density scan. This is done to screen for osteoporosis. You may have this scan if you are at high risk for osteoporosis. Talk with your health care provider about your test results, treatment options,and if necessary, the need for more tests. Follow these instructions at home: Eating and drinking  Eat a diet that includes fresh fruits and vegetables, whole grains, lean protein, and low-fat dairy products. Take vitamin and mineral supplements as recommended by your health care provider. Do not drink alcohol if: Your health care provider tells you not to drink. You are pregnant, may be pregnant, or are planning to become pregnant. If you drink alcohol: Limit how much you have to 0-1 drink a day. Be aware of how much alcohol is in your drink. In the [place] one drink equals one 12 oz bottle of beer (355 mL), one 5 oz glass of wine (148 mL), or one 1 oz glass of hard liquor (44 mL).  Lifestyle Take daily care of your teeth and gums. Brush your teeth every morning and night with fluoride toothpaste. Floss one time each day. Stay active. Exercise for at least 30 minutes 5 or more days each week. Do not use any products that contain nicotine or tobacco, such as cigarettes, e-cigarettes, and chewing tobacco. If you need help quitting, ask your health care provider. Do not use drugs. If you are sexually  active, practice safe sex. Use a condom or other form of protection to prevent STIs (sexually transmitted infections). If you do not wish to become pregnant, use a form of birth control. If you plan to become pregnant, see your health care provider for a prepregnancy visit. If told by your health care provider, take low-dose aspirin daily starting at age 37. Find healthy ways to cope with stress, such as: Meditation, yoga, or listening to music. Journaling. Talking to a trusted person. Spending time with friends and family. Safety Always wear your seat belt while driving or riding in a vehicle. Do not drive: If you have been drinking alcohol. Do not ride with someone who has been drinking. When you are tired or distracted. While texting. Wear a helmet and other protective equipment during sports activities. If you have firearms in your house, make sure you follow all gun safety procedures. What's next? Visit your health care provider once a year for an annual wellness visit. Ask your health  care provider how often you should have your eyes and teeth checked. Stay up to date on all vaccines. This information is not intended to replace advice given to you by your health care provider. Make sure you discuss any questions you have with your healthcare provider. Document Revised: 05/03/2020 Document Reviewed: 04/10/2018 Elsevier Patient Education  2022 [employer].

## 2021-02-28 NOTE — Addendum Note (Signed)
Addended by: Vivi Barrack on: 02/28/2021 08:57 AM   Modules accepted: Level of Service

## 2021-02-28 NOTE — Assessment & Plan Note (Signed)
Still has frequent panic attacks.  Discussed treatment options.  We will start BuSpar 5 mg twice daily and she will check in with me in a couple of weeks.  Continue Ativan 0.25 mg daily as needed.

## 2021-03-15 ENCOUNTER — Other Ambulatory Visit: Payer: Self-pay | Admitting: Obstetrics and Gynecology

## 2021-03-15 DIAGNOSIS — N6001 Solitary cyst of right breast: Secondary | ICD-10-CM

## 2021-03-29 ENCOUNTER — Other Ambulatory Visit: Payer: Self-pay | Admitting: Obstetrics and Gynecology

## 2021-03-29 ENCOUNTER — Ambulatory Visit
Admission: RE | Admit: 2021-03-29 | Discharge: 2021-03-29 | Disposition: A | Discharge status: Home / Self Care | Payer: BC Managed Care – PPO | Source: Ambulatory Visit | Attending: Obstetrics and Gynecology | Admitting: Obstetrics and Gynecology

## 2021-03-29 ENCOUNTER — Other Ambulatory Visit: Payer: Self-pay

## 2021-03-29 DIAGNOSIS — N6002 Solitary cyst of left breast: Secondary | ICD-10-CM

## 2021-03-29 DIAGNOSIS — N6001 Solitary cyst of right breast: Secondary | ICD-10-CM

## 2021-04-06 ENCOUNTER — Ambulatory Visit
Admission: RE | Admit: 2021-04-06 | Discharge: 2021-04-06 | Disposition: A | Discharge status: Home / Self Care | Payer: BC Managed Care – PPO | Source: Ambulatory Visit | Attending: Obstetrics and Gynecology | Admitting: Obstetrics and Gynecology

## 2021-04-06 ENCOUNTER — Other Ambulatory Visit: Payer: Self-pay | Admitting: Obstetrics and Gynecology

## 2021-04-06 ENCOUNTER — Other Ambulatory Visit: Payer: Self-pay

## 2021-04-06 DIAGNOSIS — N6002 Solitary cyst of left breast: Secondary | ICD-10-CM

## 2021-04-06 DIAGNOSIS — N6001 Solitary cyst of right breast: Secondary | ICD-10-CM

## 2021-04-07 ENCOUNTER — Other Ambulatory Visit: Payer: Self-pay | Admitting: Cardiology

## 2021-04-15 ENCOUNTER — Other Ambulatory Visit: Payer: Self-pay | Admitting: Family Medicine

## 2021-06-17 ENCOUNTER — Encounter: Payer: Self-pay | Admitting: Family Medicine

## 2021-06-19 NOTE — Telephone Encounter (Signed)
Please advise 

## 2021-07-18 ENCOUNTER — Encounter: Payer: Self-pay | Admitting: Family Medicine

## 2021-07-18 NOTE — Telephone Encounter (Signed)
See note

## 2021-07-19 ENCOUNTER — Other Ambulatory Visit: Payer: Self-pay | Admitting: *Deleted

## 2021-07-19 MED ORDER — BUSPIRONE HCL 10 MG PO TABS: 10.0000 mg | ORAL_TABLET | Freq: Two times a day (BID) | ORAL | 1 refills | Status: DC

## 2021-07-19 NOTE — Telephone Encounter (Signed)
Rx send to pharmacy  

## 2021-07-19 NOTE — Telephone Encounter (Signed)
See note

## 2021-07-31 ENCOUNTER — Encounter: Payer: Self-pay | Admitting: Family Medicine

## 2021-07-31 NOTE — Telephone Encounter (Signed)
Dr. Jerline Pain, please see message and advise if pt needs virtual visit.

## 2021-08-01 ENCOUNTER — Telehealth: Payer: BC Managed Care – PPO | Admitting: Family Medicine

## 2021-08-02 ENCOUNTER — Encounter: Payer: Self-pay | Admitting: Physician Assistant

## 2021-08-02 ENCOUNTER — Ambulatory Visit (INDEPENDENT_AMBULATORY_CARE_PROVIDER_SITE_OTHER): Payer: BC Managed Care – PPO | Admitting: Physician Assistant

## 2021-08-02 ENCOUNTER — Other Ambulatory Visit: Payer: Self-pay

## 2021-08-02 VITALS — BP 115/78 | HR 71 | Temp 98.1°F | Ht 64.0 in | Wt 146.4 lb

## 2021-08-02 DIAGNOSIS — J22 Unspecified acute lower respiratory infection: Secondary | ICD-10-CM

## 2021-08-02 MED ORDER — HYDROCOD POLST-CPM POLST ER 10-8 MG/5ML PO SUER: 5.0000 mL | Freq: Two times a day (BID) | ORAL | 0 refills | Status: DC | PRN

## 2021-08-02 NOTE — Patient Instructions (Signed)
I recommend stopping the Levaquin on Friday as your last dose. This will cover any community acquired pneumonia. Try to rest as best as you can. Push fluids. You may use the inhaler two to four times daily.  Tussionex cough syrup - do not drive with this medication.  Feel better! Your cough may continue to linger for weeks unfortunately.

## 2021-08-02 NOTE — Progress Notes (Signed)
Subjective:    Patient ID: Caroline Peterson, female    DOB: 1975-09-08, 45 y.o.   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 She was given albuterol inhaler, tessalon perles, kenalog injection, and Levaquin 500 mg x 10 days. COVID-19 and flu tests were negative at the Urgent Care.    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     Past Surgical History:  Procedure Laterality Date   BREAST CYST ASPIRATION Bilateral 01/2020   BREAST CYST ASPIRATION Right 2017   CESAREAN SECTION     DILATION AND CURETTAGE OF UTERUS     EYE SURGERY Bilateral 2019   Laser peripheral iridotomy   EYE SURGERY Left 2018   Laser peripheral iridotomy    MOUTH SURGERY  1994   wisdom teeth    TONSILLECTOMY AND ADENOIDECTOMY      Family History  Problem Relation Age of Onset   Hypertension Mother    Hyperlipidemia Mother    Diabetes Father    Hyperlipidemia Father    Hypertension Father    Kidney disease Father    Hypertension Brother    Cancer Maternal Grandmother    Cancer Maternal Grandfather    Cancer Paternal Grandmother    Heart disease Paternal Grandmother    Heart disease Paternal Grandfather    Hypertension Paternal Grandfather     Social History   Tobacco Use   Smoking status: Never   Smokeless tobacco: Never  Vaping Use    Vaping Use: Never used  Substance Use Topics   Alcohol use: No   Drug use: No     Allergies  Allergen Reactions   Aspirin Nausea And Vomiting    "can take coated aspirin"   Erythromycin Nausea And Vomiting   Levaquin [Levofloxacin] Other (See Comments)    Trigger thumb; arthralgias   Nitrofurantoin Other (See Comments)    Unknown    Azithromycin Nausea And Vomiting and Palpitations    "made my heart speed up"    Review of Systems  Respiratory:  Positive for cough.   NEGATIVE UNLESS OTHERWISE INDICATED IN HPI      Objective:     BP 115/78    Pulse 71    Temp 98.1 F (36.7 C)    Ht 5\' 4"  (1.626 m)    Wt 146 lb 6.1 oz (66.4 kg)    SpO2 99%    BMI 25.13 kg/m   Wt Readings from Last 3 Encounters:  08/02/21 146 lb 6.1 oz (66.4 kg)  02/28/21 148 lb 6.4 oz (67.3 kg)  10/06/20 149 lb (67.6 kg)    BP Readings from Last 3 Encounters:  08/02/21 115/78  02/28/21 122/80  10/06/20 124/84     Physical Exam Vitals and nursing note reviewed.  Constitutional:      Appearance: Normal appearance. She is normal weight. She is not  toxic-appearing.  HENT:     Head: Normocephalic and atraumatic.     Right Ear: Tympanic membrane, ear canal and external ear normal.     Left Ear: Tympanic membrane, ear canal and external ear normal.     Nose: Nose normal.     Mouth/Throat:     Mouth: Mucous membranes are moist.  Eyes:     Extraocular Movements: Extraocular movements intact.     Conjunctiva/sclera: Conjunctivae normal.     Pupils: Pupils are equal, round, and reactive to light.  Cardiovascular:     Rate and Rhythm: Normal rate and regular rhythm.     Pulses: Normal pulses.     Heart sounds: Normal heart sounds.  Pulmonary:     Effort: Pulmonary effort is normal.     Breath sounds: Normal breath sounds.     Comments: Frequent cough Musculoskeletal:        General: Normal range of motion.     Cervical back: Normal range of motion and neck supple.  Skin:    General: Skin is  warm and dry.  Neurological:     General: No focal deficit present.     Mental Status: She is alert and oriented to person, place, and time.  Psychiatric:        Mood and Affect: Mood normal.        Behavior: Behavior normal.        Thought Content: Thought content normal.        Judgment: Judgment normal.       Assessment & Plan:   Problem List Items Addressed This Visit   None Visit Diagnoses     Acute lower respiratory infection    -  Primary        Meds ordered this encounter  Medications   chlorpheniramine-HYDROcodone (TUSSIONEX PENNKINETIC ER) 10-8 MG/5ML SUER    Sig: Take 5 mLs by mouth every 12 (twelve) hours as needed for cough.    Dispense:  115 mL    Refill:  0    1. Acute lower respiratory infection -Reviewed MyChart messages and notes about her urgent care visit -Reassured her that she is covered as far as antibiotics go for CAP and usually Levaquin is only indicated for 5-7 days -As she is starting to have some trigger thumb (which happened last time with levaquin as well), I went ahead and added to her allergy list as a reminder about this and suggested she stop the antibiotic by Friday. -Tussionex Rx sent. R/B/A discussed. -She will f/up prn    Gor Vestal M Jassen Sarver, PA-C

## 2021-09-08 IMAGING — MG DIGITAL DIAGNOSTIC BILAT W/ TOMO W/ CAD
6 of 12 series · 6 of 36 positions shown · non-contrast
Comparison: Previous exam(s).

CLINICAL DATA: Palpable lump in the left axilla. Palpable lump in
the right breast.

EXAM:
DIGITAL DIAGNOSTIC BILATERAL MAMMOGRAM WITH TOMOSYNTHESIS AND CAD;
ULTRASOUND RIGHT BREAST LIMITED; US AXILLARY LEFT
TECHNIQUE: Bilateral digital diagnostic mammography and breast tomosynthesis
was performed. The images were evaluated with computer-aided
detection.; Targeted ultrasound examination of the right breast was
performed; Targeted ultrasound examination of the left axilla was
performed.

[R TAN synth-2D]
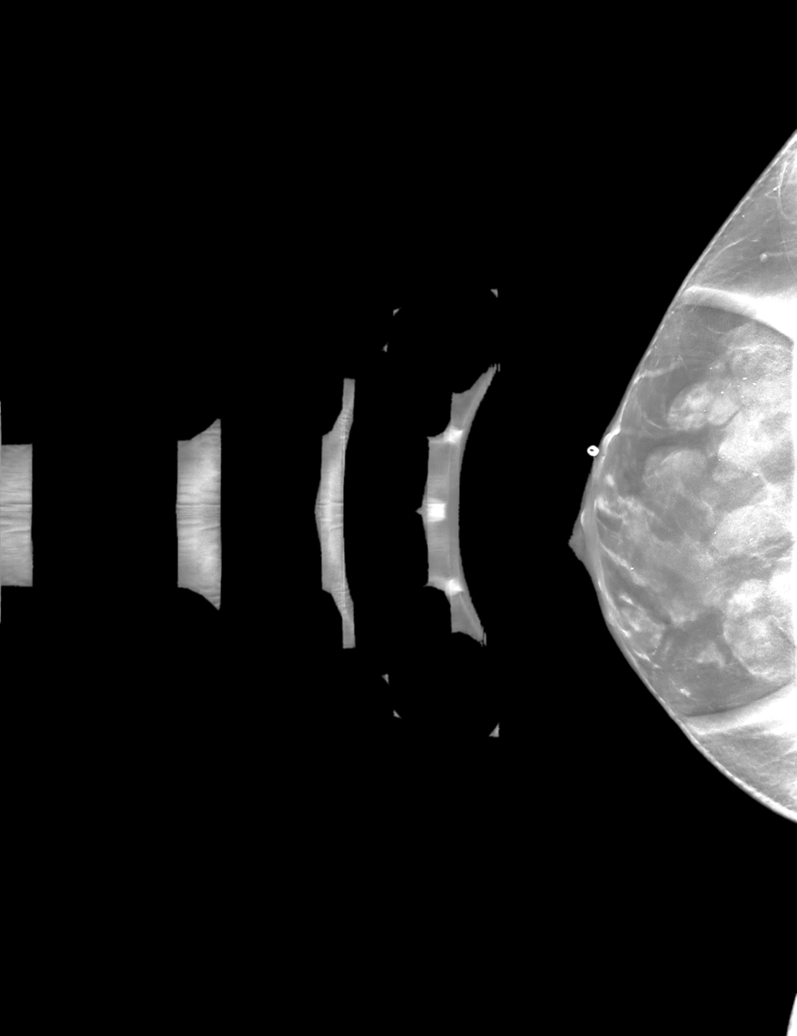

[L TAN synth-2D]
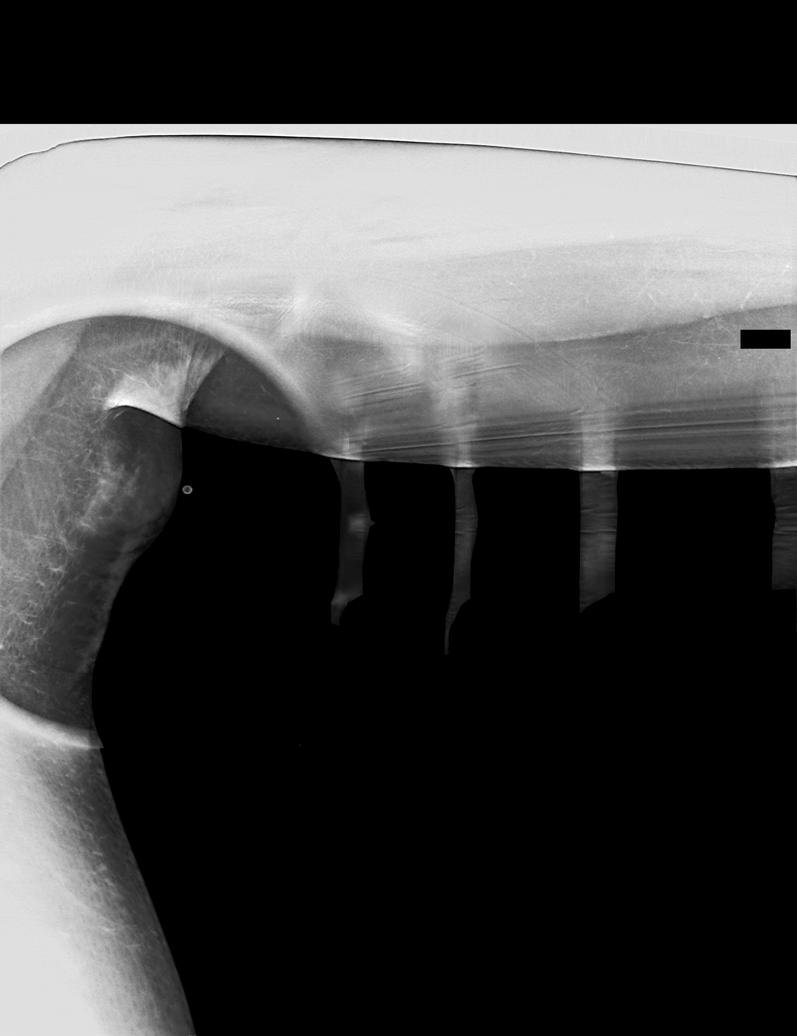

[L MLO synth-2D]
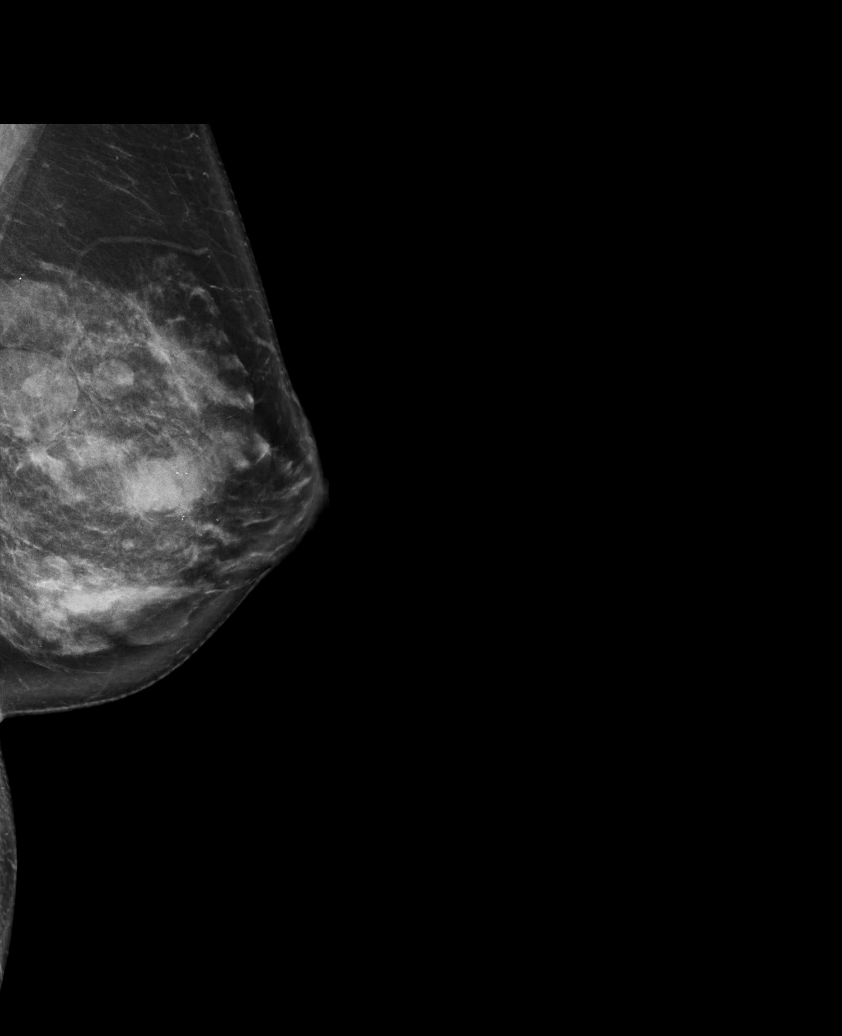

[L CC synth-2D]
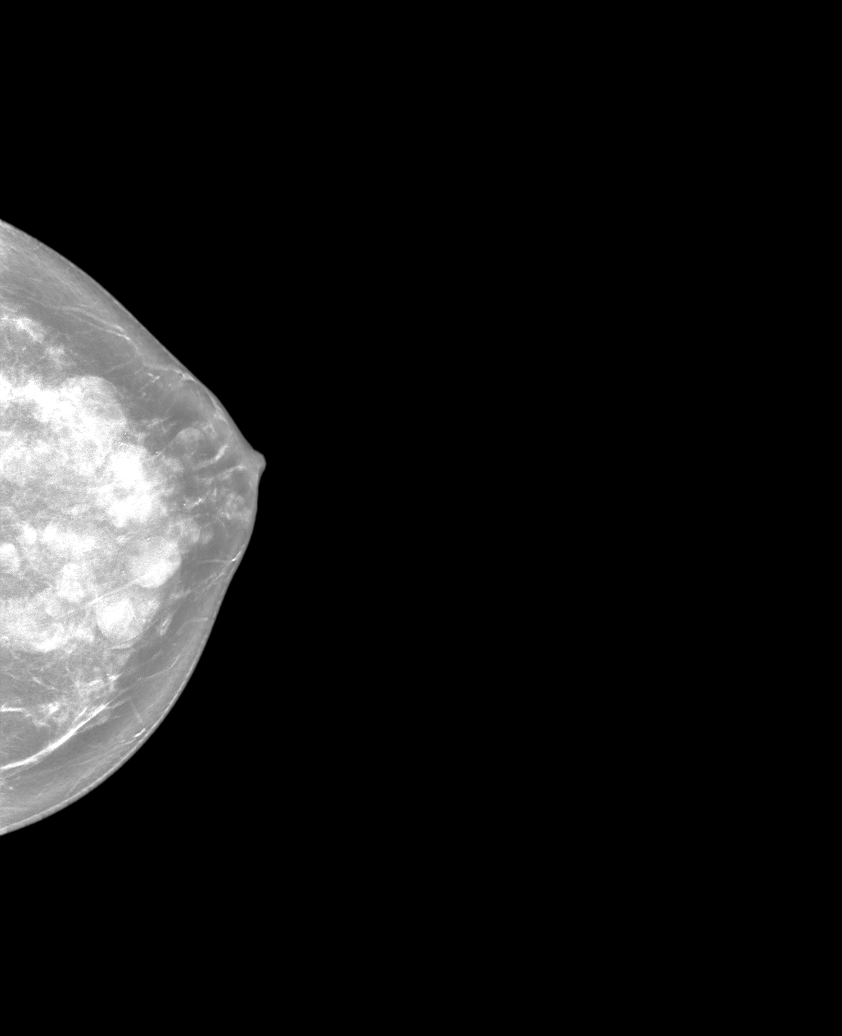

[R MLO synth-2D]
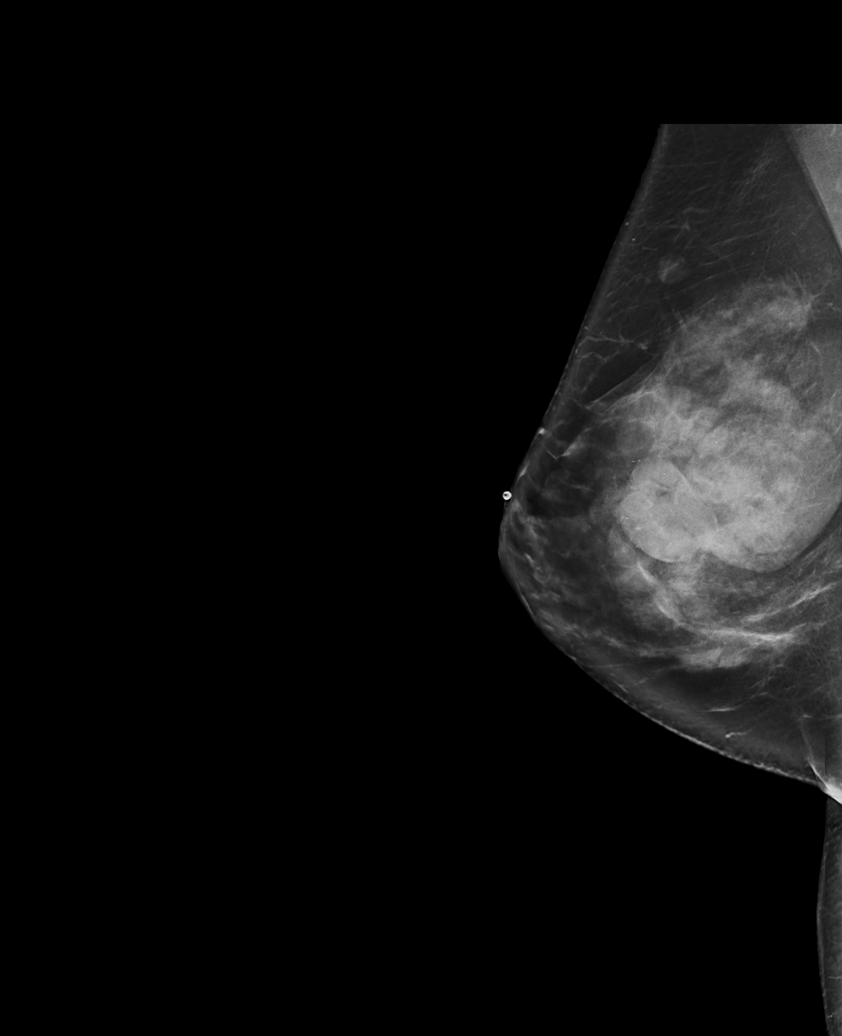

[R CC synth-2D]
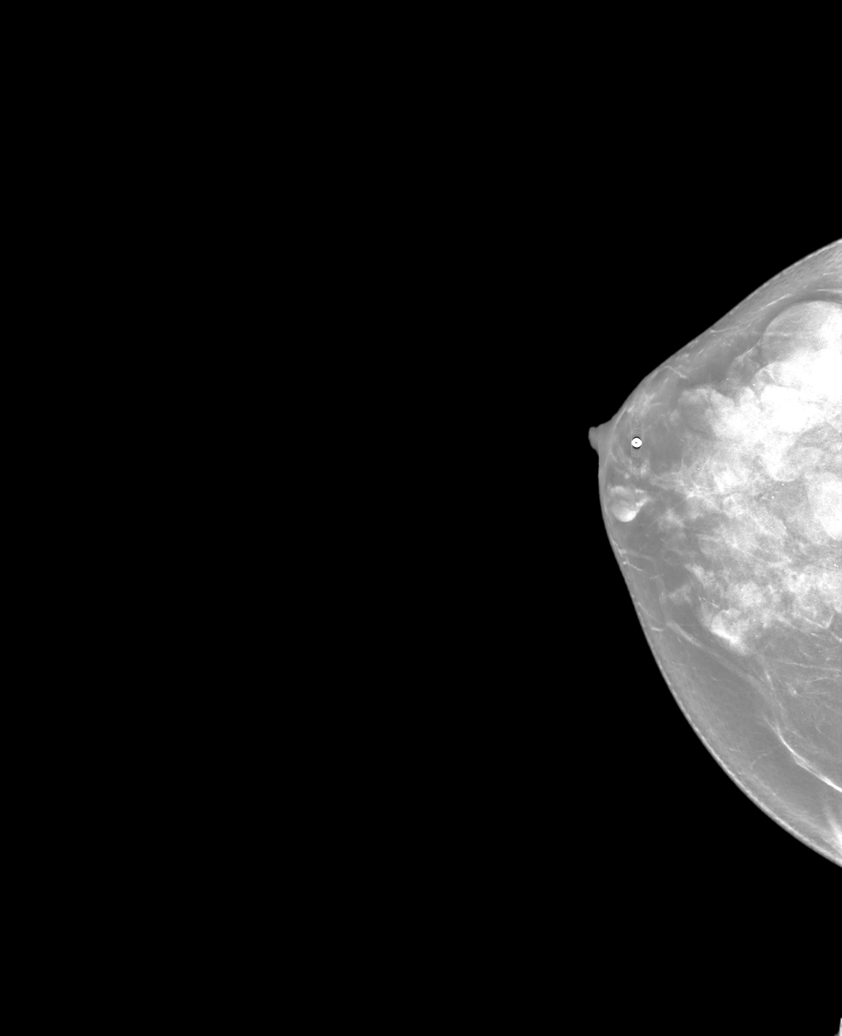

[6 of 36 positions shown; findings below may reference images not displayed]

ACR Breast Density Category c: The breast tissue is heterogeneously
dense, which may obscure small masses.
FINDINGS: The patient has a waxing and waning cystic pattern bilaterally. No
suspicious masses are seen in either breast. Benign calcifications
are seen bilaterally. There appears to be an island of dense
glandular tissue with interspersed fat in the region of the left
axillary lump.

Targeted ultrasound is performed, showing fibrocystic changes in the
region of the patient's symptoms. The dominant cysts being felt by
the patient measures at least 3.3 cm. There is an island of dense
glandular tissue in the left axilla.
IMPRESSION: Fibrocystic changes bilaterally. Island of dense glandular tissue in
the left axilla accounting for the palpable lump in this region.

RECOMMENDATION:
Recommend annual screening mammography. The patient requested
bilateral cyst aspirations. We will schedule the patient to come
back to aspirate the 1 or 2 dominant cysts in each breast.

I have discussed the findings and recommendations with the patient.
If applicable, a reminder letter will be sent to the patient
regarding the next appointment.

BI-RADS CATEGORY  2: Benign.

## 2021-09-08 IMAGING — US US AXILLARY LEFT
1 series · 5 of 5 positions shown · non-contrast
Comparison: Previous exam(s).

CLINICAL DATA: Palpable lump in the left axilla. Palpable lump in
the right breast.

EXAM:
DIGITAL DIAGNOSTIC BILATERAL MAMMOGRAM WITH TOMOSYNTHESIS AND CAD;
ULTRASOUND RIGHT BREAST LIMITED; US AXILLARY LEFT
TECHNIQUE: Bilateral digital diagnostic mammography and breast tomosynthesis
was performed. The images were evaluated with computer-aided
detection.; Targeted ultrasound examination of the right breast was
performed; Targeted ultrasound examination of the left axilla was
performed.

[Series 1: us axillary left · 0.06mm/px · 5 of 5 slices shown]
[im 1/5]
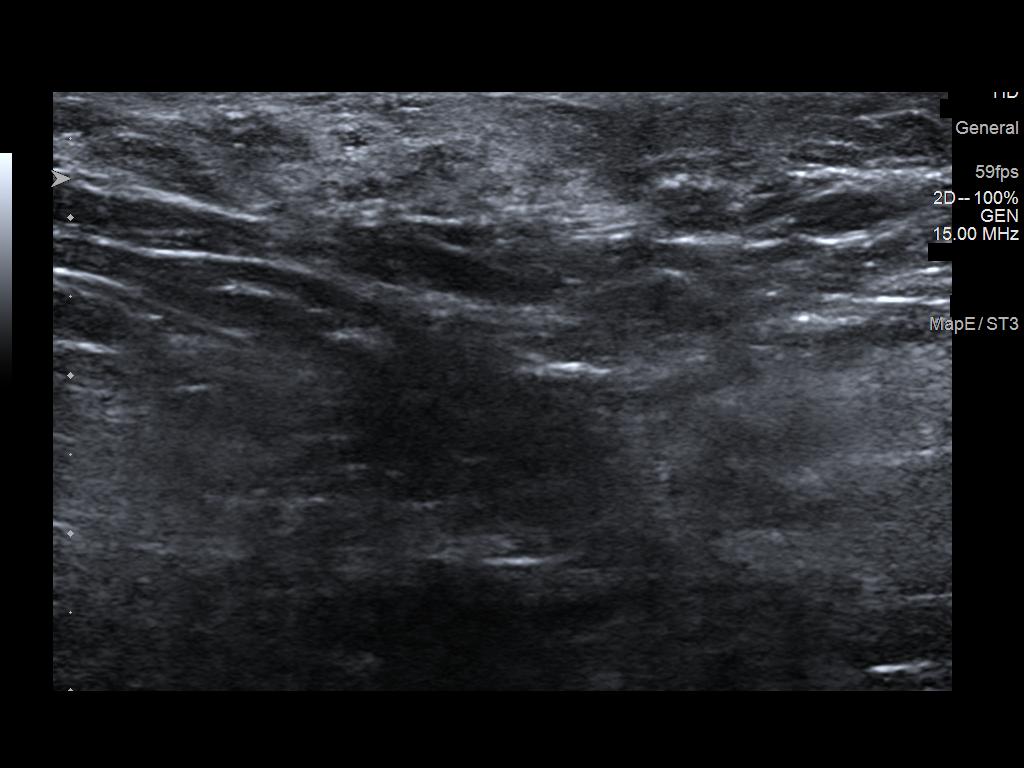
[im 2/5]
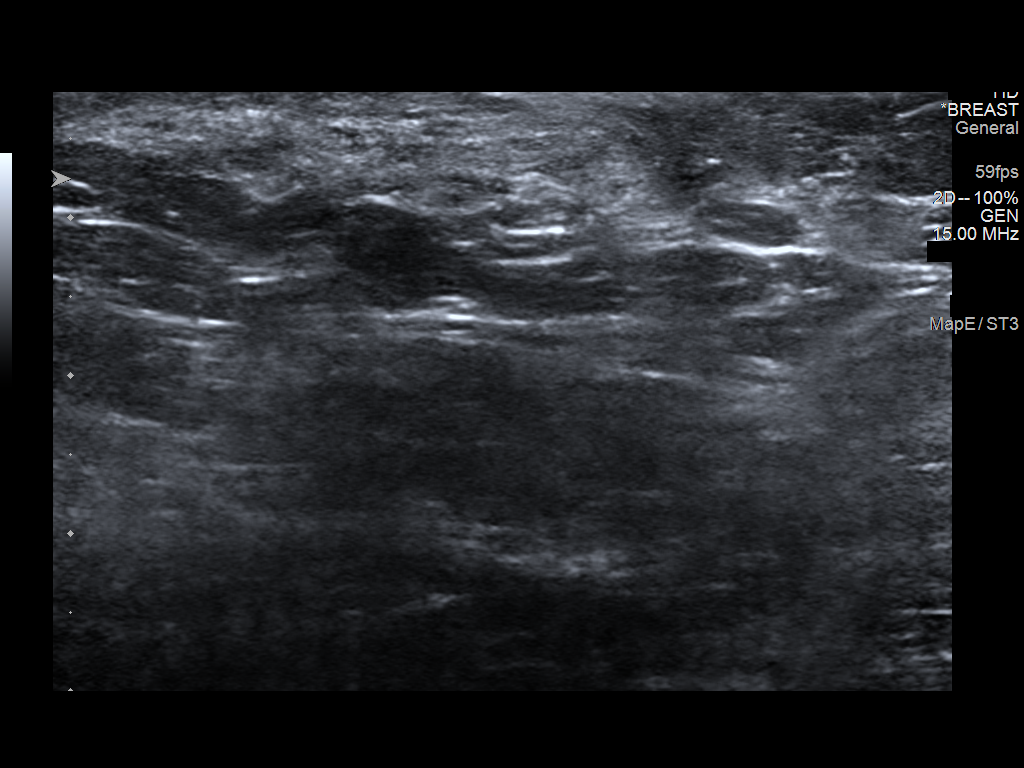
[im 3/5]
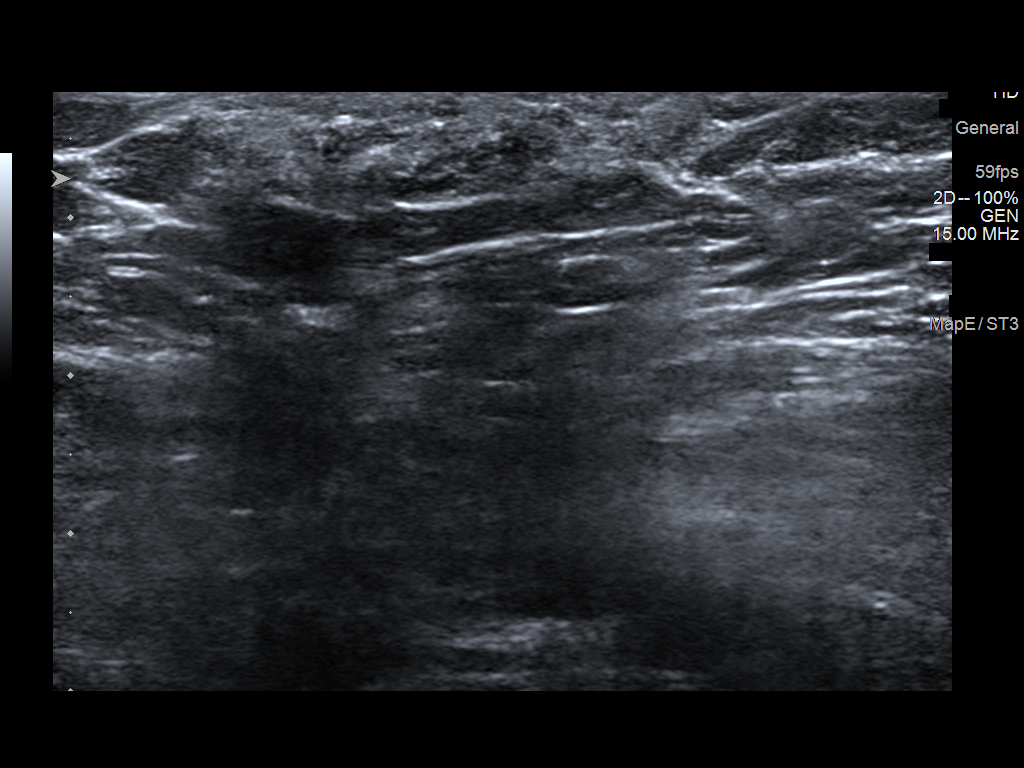
[im 4/5]
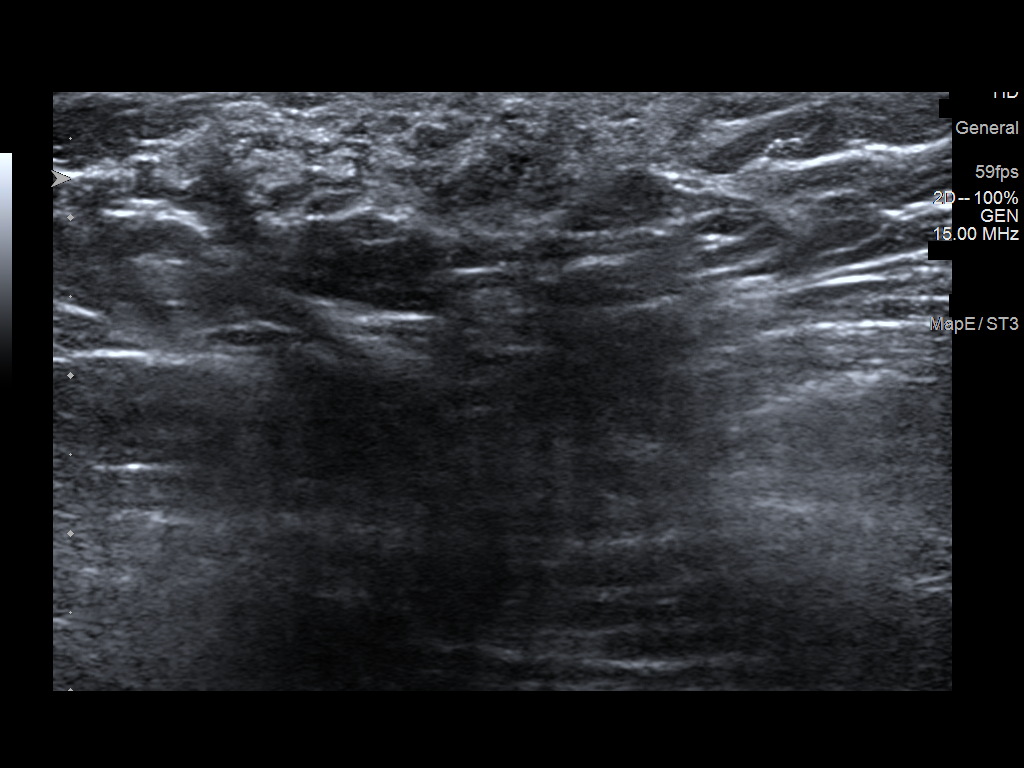
[im 5/5]
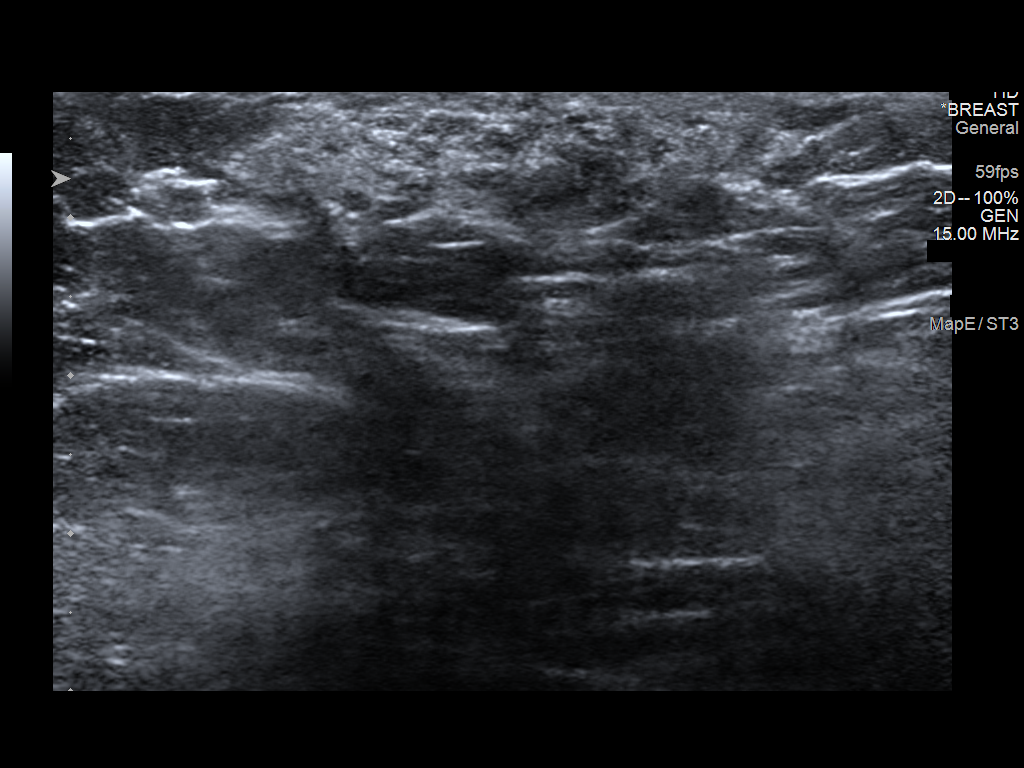

[5 of 5 positions shown; findings below may reference images not displayed]

ACR Breast Density Category c: The breast tissue is heterogeneously
dense, which may obscure small masses.
FINDINGS: The patient has a waxing and waning cystic pattern bilaterally. No
suspicious masses are seen in either breast. Benign calcifications
are seen bilaterally. There appears to be an island of dense
glandular tissue with interspersed fat in the region of the left
axillary lump.

Targeted ultrasound is performed, showing fibrocystic changes in the
region of the patient's symptoms. The dominant cysts being felt by
the patient measures at least 3.3 cm. There is an island of dense
glandular tissue in the left axilla.
IMPRESSION: Fibrocystic changes bilaterally. Island of dense glandular tissue in
the left axilla accounting for the palpable lump in this region.

RECOMMENDATION:
Recommend annual screening mammography. The patient requested
bilateral cyst aspirations. We will schedule the patient to come
back to aspirate the 1 or 2 dominant cysts in each breast.

I have discussed the findings and recommendations with the patient.
If applicable, a reminder letter will be sent to the patient
regarding the next appointment.

BI-RADS CATEGORY  2: Benign.

## 2021-09-12 ENCOUNTER — Encounter: Payer: Self-pay | Admitting: Family Medicine

## 2021-09-13 ENCOUNTER — Telehealth: Payer: BC Managed Care – PPO | Admitting: Physician Assistant

## 2021-09-13 DIAGNOSIS — U071 COVID-19: Secondary | ICD-10-CM | POA: Diagnosis not present

## 2021-09-13 MED ORDER — NIRMATRELVIR/RITONAVIR (PAXLOVID)TABLET
3.0000 | ORAL_TABLET | Freq: Two times a day (BID) | ORAL | 0 refills | Status: AC
Start: 2021-09-13 — End: 2021-09-18

## 2021-09-13 NOTE — Telephone Encounter (Signed)
Please schedule virtual appointment

## 2021-09-13 NOTE — Telephone Encounter (Signed)
Can we have her schedule an appointment?  Caroline Peterson. Jerline Pain, MD 09/13/2021 10:53 AM

## 2021-09-13 NOTE — Patient Instructions (Addendum)
[name], thank you for joining [name], PA-C for today's virtual visit.  While this provider is not your primary care provider (PCP), if your PCP is located in our provider database this encounter information will be shared with them immediately following your visit.  Consent: (Patient) Caroline Peterson provided verbal consent for this virtual visit at the beginning of the encounter.  Current Medications:  Current Outpatient Medications:    acetic acid-hydrocortisone (VOSOL-HC) OTIC solution, Place 5 drops into both ears 2 (two) times daily., Disp: 10 mL, Rfl: 0   azelastine (ASTELIN) 0.1 % nasal spray, Place 2 sprays into both nostrils 2 (two) times daily., Disp: 30 mL, Rfl: 12   busPIRone (BUSPAR) 10 MG tablet, Take 1 tablet (10 mg total) by mouth 2 (two) times daily., Disp: 60 tablet, Rfl: 1   chlorpheniramine-HYDROcodone (TUSSIONEX PENNKINETIC ER) 10-8 MG/5ML SUER, Take 5 mLs by mouth every 12 (twelve) hours as needed for cough., Disp: 115 mL, Rfl: 0   LORazepam (ATIVAN) 0.5 MG tablet, TAKE 1/2 TABLET(0.25 MG) BY MOUTH DAILY AS NEEDED FOR ANXIETY, Disp: 30 tablet, Rfl: 5   MELATONIN PO, Take by mouth., Disp: , Rfl:    metoprolol succinate (TOPROL-XL) 50 MG 24 hr tablet, TAKE 1 TABLET BY MOUTH TWICE DAILY WITH OR IMMEDIATELY FOLLOWING A MEAL, Disp: 180 tablet, Rfl: 3   Multiple Vitamin (MULTIVITAMIN) tablet, Take 1 tablet by mouth daily., Disp: , Rfl:    valACYclovir (VALTREX) 500 MG tablet, Take 1 tablet (500 mg total) by mouth daily., Disp: 90 tablet, Rfl: 3   Medications ordered in this encounter:  No orders of the defined types were placed in this encounter.    *If you need refills on other medications prior to your next appointment, please contact your pharmacy*  Follow-Up: Call back or seek an in-person evaluation if the symptoms worsen or if the condition fails to improve as anticipated.  Other Instructions Please keep  well-hydrated and get plenty of rest. Start a saline nasal rinse to flush out your nasal passages. You can use plain Mucinex to help thin congestion. If you have a humidifier, running in the bedroom at night. I want you to start OTC vitamin D3 1000 units daily, vitamin C 1000 mg daily, and a zinc supplement. Please take prescribed medications as directed.  You have been enrolled in a MyChart symptom monitoring program. Please answer these questions daily so we can keep track of how you are doing.  You were to quarantine for 5 days from onset of your symptoms.  After day 5, if you have had no fever and you are feeling better, you can end quarantine but need to mask for an additional 5 days. After day 5 if you have a fever or are having significant symptoms, please quarantine for full 10 days.  If you note any worsening of symptoms, any significant shortness of breath or any chest pain, please seek ER evaluation ASAP.  Please do not delay care!  COVID-19: What to Do if You Are Sick If you test positive and are an older adult or someone who is at high risk of getting very sick from COVID-19, treatment may be available. Contact a healthcare provider right away after a positive test to determine if you are eligible, even if your symptoms are mild right now. You can also visit a Test to Treat location and, if eligible, receive a prescription from a provider. Don't delay: Treatment must be started within the first  few days to be effective. If you have a fever, cough, or other symptoms, you might have COVID-19. Most people have mild illness and are able to recover at home. If you are sick: Keep track of your symptoms. If you have an emergency warning sign (including trouble breathing), call 911. Steps to help prevent the spread of COVID-19 if you are sick If you are sick with COVID-19 or think you might have COVID-19, follow the steps below to care for yourself and to help protect other people in your  home and community. Stay home except to get medical care Stay home. Most people with COVID-19 have mild illness and can recover at home without medical care. Do not leave your home, except to get medical care. Do not visit public areas and do not go to places where you are unable to wear a mask. Take care of yourself. Get rest and stay hydrated. Take over-the-counter medicines, such as acetaminophen, to help you feel better. Stay in touch with your doctor. Call before you get medical care. Be sure to get care if you have trouble breathing, or have any other emergency warning signs, or if you think it is an emergency. Avoid public transportation, ride-sharing, or taxis if possible. Get tested If you have symptoms of COVID-19, get tested. While waiting for test results, stay away from others, including staying apart from those living in your household. Get tested as soon as possible after your symptoms start. Treatments may be available for people with COVID-19 who are at risk for becoming very sick. Don't delay: Treatment must be started early to be effective--some treatments must begin within 5 days of your first symptoms. Contact your healthcare provider right away if your test result is positive to determine if you are eligible. Self-tests are one of several options for testing for the virus that causes COVID-19 and may be more convenient than laboratory-based tests and point-of-care tests. Ask your healthcare provider or your local health department if you need help interpreting your test results. You can visit your state, tribal, local, and territorial health department's website to look for the latest local information on testing sites. Separate yourself from other people As much as possible, stay in a specific room and away from other people and pets in your home. If possible, you should use a separate bathroom. If you need to be around other people or animals in or outside of the home, wear a  well-fitting mask. Tell your close contacts that they may have been exposed to COVID-19. An infected person can spread COVID-19 starting 48 hours (or 2 days) before the person has any symptoms or tests positive. By letting your close contacts know they may have been exposed to COVID-19, you are helping to protect everyone. See COVID-19 and Animals if you have questions about pets. If you are diagnosed with COVID-19, someone from the health department may call you. Answer the call to slow the spread. Monitor your symptoms Symptoms of COVID-19 include fever, cough, or other symptoms. Follow care instructions from your healthcare provider and local health department. Your local health authorities may give instructions on checking your symptoms and reporting information. When to seek emergency medical attention Look for emergency warning signs* for COVID-19. If someone is showing any of these signs, seek emergency medical care immediately: Trouble breathing Persistent pain or pressure in the chest New confusion Inability to wake or stay awake Pale, gray, or blue-colored skin, lips, or nail beds, depending on skin tone *This list  is not all possible symptoms. Please call your medical provider for any other symptoms that are severe or concerning to you. Call 911 or call ahead to your local emergency facility: Notify the operator that you are seeking care for someone who has or may have COVID-19. Call ahead before visiting your doctor Call ahead. Many medical visits for routine care are being postponed or done by phone or telemedicine. If you have a medical appointment that cannot be postponed, call your doctor's office, and tell them you have or may have COVID-19. This will help the office protect themselves and other patients. If you are sick, wear a well-fitting mask You should wear a mask if you must be around other people or animals, including pets (even at home). Wear a mask with the best fit,  protection, and comfort for you. You don't need to wear the mask if you are alone. If you can't put on a mask (because of trouble breathing, for example), cover your coughs and sneezes in some other way. Try to stay at least 6 feet away from other people. This will help protect the people around you. Masks should not be placed on young children under age 73 years, anyone who has trouble breathing, or anyone who is not able to remove the mask without help. Cover your coughs and sneezes Cover your mouth and nose with a tissue when you cough or sneeze. Throw away used tissues in a lined trash can. Immediately wash your hands with soap and water for at least 20 seconds. If soap and water are not available, clean your hands with an alcohol-based hand sanitizer that contains at least 60% alcohol. Clean your hands often Wash your hands often with soap and water for at least 20 seconds. This is especially important after blowing your nose, coughing, or sneezing; going to the bathroom; and before eating or preparing food. Use hand sanitizer if soap and water are not available. Use an alcohol-based hand sanitizer with at least 60% alcohol, covering all surfaces of your hands and rubbing them together until they feel dry. Soap and water are the best option, especially if hands are visibly dirty. Avoid touching your eyes, nose, and mouth with unwashed hands. Handwashing Tips Avoid sharing personal household items Do not share dishes, drinking glasses, cups, eating utensils, towels, or bedding with other people in your home. Wash these items thoroughly after using them with soap and water or put in the dishwasher. Clean surfaces in your home regularly Clean and disinfect high-touch surfaces (for example, doorknobs, tables, handles, light switches, and countertops) in your "sick room" and bathroom. In shared spaces, you should clean and disinfect surfaces and items after each use by the person who is ill. If you  are sick and cannot clean, a caregiver or other person should only clean and disinfect the area around you (such as your bedroom and bathroom) on an as needed basis. Your caregiver/other person should wait as long as possible (at least several hours) and wear a mask before entering, cleaning, and disinfecting shared spaces that you use. Clean and disinfect areas that may have blood, stool, or body fluids on them. Use household cleaners and disinfectants. Clean visible dirty surfaces with household cleaners containing soap or detergent. Then, use a household disinfectant. Use a product from [employer] List N: Disinfectants for Coronavirus (SWNIO-27). Be sure to follow the instructions on the label to ensure safe and effective use of the product. Many products recommend keeping the surface wet with a  disinfectant for a certain period of time (look at "contact time" on the product label). You may also need to wear personal protective equipment, such as gloves, depending on the directions on the product label. Immediately after disinfecting, wash your hands with soap and water for 20 seconds. For completed guidance on cleaning and disinfecting your home, visit Complete Disinfection Guidance. Take steps to improve ventilation at home Improve ventilation (air flow) at home to help prevent from spreading COVID-19 to other people in your household. Clear out COVID-19 virus particles in the air by opening windows, using air filters, and turning on fans in your home. Use this interactive tool to learn how to improve air flow in your home. When you can be around others after being sick with COVID-19 Deciding when you can be around others is different for different situations. Find out when you can safely end home isolation. For any additional questions about your care, contact your healthcare provider or state or local health department. 11/01/2020 Content source: [facility]  [facility]ise[facility] ([facility]), Division of Viral Diseases This information is not intended to replace advice given to you by your health care provider. Make sure you discuss any questions you have with your health care provider. Document Revised: 12/15/2020 Document Reviewed: 12/15/2020 Elsevier Patient Education  2022 [employer].   If you have been instructed to have an in-person evaluation today at a local Urgent Care facility, please use the link below. It will take you to a list of all of our available [facility], including address, phone number and hours of operation. Please do not delay care.  [facility]  If you or a family member do not have a primary care provider, use the link below to schedule a visit and establish care. When you choose a [facility] primary care physician or advanced practice provider, you gain a long-term partner in health. Find a Primary Care Provider  Learn more about [facility]'s in-office and virtual care options: [facility]

## 2021-09-13 NOTE — Telephone Encounter (Signed)
See note

## 2021-09-13 NOTE — Telephone Encounter (Signed)
Patient did a virtual visit through [employer] with [facility].

## 2021-09-13 NOTE — Progress Notes (Signed)
Virtual Visit Consent   Caroline Peterson, you are scheduled for a virtual visit with a [facility] provider today.     Just as with appointments in the office, your consent must be obtained to participate.  Your consent will be active for this visit and any virtual visit you may have with one of our providers in the next 365 days.     If you have a MyChart account, a copy of this consent can be sent to you electronically.  All virtual visits are billed to your insurance company just like a traditional visit in the office.    As this is a virtual visit, video technology does not allow for your provider to perform a traditional examination.  This may limit your provider's ability to fully assess your condition.  If your provider identifies any concerns that need to be evaluated in person or the need to arrange testing (such as labs, EKG, etc.), we will make arrangements to do so.     Although advances in technology are sophisticated, we cannot ensure that it will always work on either your end or our end.  If the connection with a video visit is poor, the visit may have to be switched to a telephone visit.  With either a video or telephone visit, we are not always able to ensure that we have a secure connection.     I need to obtain your verbal consent now.   Are you willing to proceed with your visit today?    Caroline Peterson has provided verbal consent on 09/13/2021 for a virtual visit (video or telephone).   Caroline Peterson, [place]   Date: 09/13/2021 3:50 PM   Virtual Visit via Video Note   I, Caroline Peterson, connected with  Fatina Sprankle  ([id number], 1975/10/06) on 09/13/21 at  3:45 PM EST by a video-enabled telemedicine application and verified that I am speaking with the correct person using two identifiers.  Location: Patient: Virtual Visit Location Patient: Home Provider: Virtual Visit Location Provider: Home Office   I discussed  the limitations of evaluation and management by telemedicine and the availability of in person appointments. The patient expressed understanding and agreed to proceed.    History of Present Illness: Caroline Peterson is a 46 y.o. who identifies as a female who was assigned female at birth, and is being seen today for COVID-19. Notes symptoms starting with a headache on Sunday. Since then has developed a cough and some nasal and head congestion with yellow drainage. Also notes fatigue and significant rhinorrhea. Denies fever, chest pain or SOB. Denies GI symptoms. Had COVID last June which she notes was more significant at this point. Took Paxlovid and tolerated the medication well overall. For symptoms, has been using her Astelin spray and Ibuprofen as needed.   HPI: HPI  Problems:  Patient Active Problem List   Diagnosis Date Noted   Dyslipidemia 02/28/2021   Other fatigue 10/05/2020   Hearing loss 09/19/2020   Lipoma 06/10/2020   Allergic rhinitis 11/09/2019   Insomnia 11/09/2019   Cold sore 11/09/2019   Osteoarthritis 11/09/2019   Eye abnormality 11/09/2019   Panic attacks 04/09/2019   Palpitation 02/17/2013   Hypertension 11/22/2010    Allergies:  Allergies  Allergen Reactions   Aspirin Nausea And Vomiting    "can take coated aspirin"   Erythromycin Nausea And Vomiting   Levaquin [Levofloxacin] Other (See Comments)    Trigger thumb; arthralgias  Nitrofurantoin Other (See Comments)    Unknown    Azithromycin Nausea And Vomiting and Palpitations    "made my heart speed up"   Medications:  Current Outpatient Medications:    azelastine (ASTELIN) 0.1 % nasal spray, Place 2 sprays into both nostrils 2 (two) times daily., Disp: 30 mL, Rfl: 12   busPIRone (BUSPAR) 10 MG tablet, Take 1 tablet (10 mg total) by mouth 2 (two) times daily., Disp: 60 tablet, Rfl: 1   LORazepam (ATIVAN) 0.5 MG tablet, TAKE 1/2 TABLET(0.25 MG) BY MOUTH DAILY AS NEEDED FOR ANXIETY, Disp: 30  tablet, Rfl: 5   MELATONIN PO, Take by mouth., Disp: , Rfl:    metoprolol succinate (TOPROL-XL) 50 MG 24 hr tablet, TAKE 1 TABLET BY MOUTH TWICE DAILY WITH OR IMMEDIATELY FOLLOWING A MEAL, Disp: 180 tablet, Rfl: 3   Multiple Vitamin (MULTIVITAMIN) tablet, Take 1 tablet by mouth daily., Disp: , Rfl:    valACYclovir (VALTREX) 500 MG tablet, Take 1 tablet (500 mg total) by mouth daily., Disp: 90 tablet, Rfl: 3  Observations/Objective: Patient is well-developed, well-nourished in no acute distress.  Resting comfortably at home.  Head is normocephalic, atraumatic.  No labored breathing. Speech is clear and coherent with logical content.  Patient is alert and oriented at baseline.   Assessment and Plan: 1. COVID-19  Patient with multiple risk factors for complicated course of illness. Discussed risks/benefits of antiviral medications including most common potential ADRs. Patient voiced understanding and would like to proceed with antiviral medication. They are candidate for Paxlovid. Last GFR 108. Rx sent to pharmacy. Supportive measures, OTC medications and vitamin regimen reviewed. Patient has been enrolled in a MyChart COVID symptom monitoring program. Samule Dry reviewed in detail. Strict ER precautions discussed with patient.    Follow Up Instructions: I discussed the assessment and treatment plan with the patient. The patient was provided an opportunity to ask questions and all were answered. The patient agreed with the plan and demonstrated an understanding of the instructions.  A copy of instructions were sent to the patient via MyChart unless otherwise noted below.   The patient was advised to call back or seek an in-person evaluation if the symptoms worsen or if the condition fails to improve as anticipated.  Time:  I spent 12 minutes with the patient via telehealth technology discussing the above problems/concerns.    Caroline Rio, PA-C

## 2021-09-16 ENCOUNTER — Other Ambulatory Visit: Payer: Self-pay | Admitting: Family Medicine

## 2021-10-12 NOTE — Progress Notes (Signed)
?  ?Cardiology Office Note ? ? ?Date:  10/13/2021  ? ?ID:  Caroline Peterson, DOB 07-22-1976, MRN [id number] ? ?PCP:  No primary care provider on file.  ?Cardiologist:   Minus Breeding, MD ? ? ?Chief Complaint  ?Patient presents with  ? Palpitations  ? ? ?  ?History of Present Illness: ?Caroline Peterson is a 46 y.o. female who presents for follow up of palpitations.   Since I last saw her she has separated from her husband.  She is living with her mom.  While she was living with her parents her dad died suddenly.  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  She does get some chest tightness when she is emotionally stressed but now she is physically stressed.  This is mild and goes away when she is relaxed.  She does get some palpitations but she has had no sustained arrhythmias.  She has had no new shortness of breath, PND or orthopnea.  She has had no new neck or arm discomfort.  ? ? ?Past Medical History:  ?Diagnosis Date  ? HELLP (hemolytic anemia/elev liver enzymes/low platelets in pregnancy)   ? Hypertension   ? 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 ? ?Past Surgical History:  ?Procedure Laterality Date  ? BREAST CYST ASPIRATION Bilateral 01/2020  ? BREAST CYST ASPIRATION Right 2017  ? CESAREAN SECTION    ? DILATION AND CURETTAGE OF UTERUS    ? EYE SURGERY Bilateral 2019  ? Laser peripheral iridotomy  ? EYE SURGERY Left 2018  ? Laser peripheral iridotomy   ? [place]  ? wisdom teeth   ? TONSILLECTOMY AND ADENOIDECTOMY    ? ? ? ?Current Outpatient Medications  ?Medication Sig Dispense Refill  ? azelastine (ASTELIN) 0.1 % nasal spray Place 2 sprays into both nostrils 2 (two) times daily. 30 mL 12  ? busPIRone (BUSPAR) 10 MG tablet TAKE 1 TABLET(10 MG) BY MOUTH TWICE DAILY 60 tablet 1  ? metoprolol succinate (TOPROL-XL) 50 MG 24 hr tablet TAKE 1 TABLET BY MOUTH TWICE DAILY WITH OR IMMEDIATELY FOLLOWING A MEAL 180 tablet 3  ? Multiple Vitamin  (MULTIVITAMIN) tablet Take 1 tablet by mouth daily.    ? valACYclovir (VALTREX) 500 MG tablet Take 1 tablet (500 mg total) by mouth daily. 90 tablet 3  ? LORazepam (ATIVAN) 0.5 MG tablet TAKE 1/2 TABLET(0.25 MG) BY MOUTH DAILY AS NEEDED FOR ANXIETY (Patient not taking: Reported on 10/13/2021) 30 tablet 5  ? MELATONIN PO Take by mouth. (Patient not taking: Reported on 10/13/2021)    ? ?No current facility-administered medications for this visit.  ? ? ?Allergies:   Aspirin, Augmentin [amoxicillin-pot clavulanate], Erythromycin, Levaquin [levofloxacin], Nitrofurantoin, and Azithromycin  ? ? ?ROS:  Please see the history of present illness.   Otherwise, review of systems are positive for none.   All other systems are reviewed and negative.  ? ? ?PHYSICAL EXAM: ?VS:  BP 130/79   Pulse 67   Ht 5\' 4"  (1.626 m)   Wt 147 lb 12.8 oz (67 kg)   SpO2 99%   BMI 25.37 kg/m?  , BMI Body mass index is 25.37 kg/m?. ?GENERAL:  Well appearing ?NECK:  No jugular venous distention, waveform within normal limits, carotid upstroke brisk and symmetric, no bruits, no thyromegaly ?LUNGS:  Clear to auscultation bilaterally ?CHEST:  Unremarkable ?HEART:  PMI not displaced or sustained,S1 and S2 within normal limits, no S3, no S4, no  clicks, no rubs, no murmurs ?ABD:  Flat, positive bowel sounds normal in frequency in pitch, no bruits, no rebound, no guarding, no midline pulsatile mass, no hepatomegaly, no splenomegaly ?EXT:  2 plus pulses throughout, no edema, no cyanosis no clubbing ? ?EKG:  EKG is  ordered today. ?The ekg ordered today demonstrates sinus rhythm, rate 67 axis within normal limits, intervals within normal limits, no acute ST-T wave changes. ? ? ?Recent Labs: ?02/28/2021: ALT 14; BUN 13; Creatinine, Ser 0.58; Hemoglobin 12.6; Platelets 217.0; Potassium 3.8; Sodium 138; TSH 0.95  ? ? ?Lipid Panel ?   ?Component Value Date/Time  ? CHOL 189 02/28/2021 0858  ? TRIG 112.0 02/28/2021 0858  ? HDL 46.20 02/28/2021 0858  ? CHOLHDL 4  02/28/2021 0858  ? VLDL 22.4 02/28/2021 0858  ? [place] 121 (H) 02/28/2021 5573  ? ?  ? ?Wt Readings from Last 3 Encounters:  ?10/13/21 147 lb 12.8 oz (67 kg)  ?08/02/21 146 lb 6.1 oz (66.4 kg)  ?02/28/21 148 lb 6.4 oz (67.3 kg)  ?  ? ? ?Other studies Reviewed: ?Additional studies/ records that were reviewed today include: Labs. ?Review of the above records demonstrates:  Please see elsewhere in the note.   ? ? ?ASSESSMENT AND PLAN: ? ?Palpitations - ?She is not having any new sustained episodes.  She gets some scattered palpitations and I will continue the metoprolol XL 50 mg twice daily.  ? ?Mitral regurgitation - ?I do not hear a murmur and I will follow this clinically.   ? ?HTN - ?Her blood pressure at target.  No change in therapy.  ? ? ?Current medicines are reviewed at length with the patient today.  The patient does not have concerns regarding medicines. ? ?The following changes have been made:  no change ? ?Labs/ tests ordered today include:  ? ?Orders Placed This Encounter  ?Procedures  ? EKG 12-Lead  ? ? ? ?Disposition:   FU with me in 12 months or sooner if needed ? ? ?Signed, ?Minus Breeding, MD  ?10/13/2021 4:47 PM    ?[place] ? ? ?

## 2021-10-13 ENCOUNTER — Ambulatory Visit (INDEPENDENT_AMBULATORY_CARE_PROVIDER_SITE_OTHER): Payer: BC Managed Care – PPO | Admitting: Cardiology

## 2021-10-13 ENCOUNTER — Encounter: Payer: Self-pay | Admitting: Cardiology

## 2021-10-13 ENCOUNTER — Other Ambulatory Visit: Payer: Self-pay

## 2021-10-13 VITALS — BP 130/79 | HR 67 | Ht 64.0 in | Wt 147.8 lb

## 2021-10-13 DIAGNOSIS — I340 Nonrheumatic mitral (valve) insufficiency: Secondary | ICD-10-CM | POA: Diagnosis not present

## 2021-10-13 DIAGNOSIS — R002 Palpitations: Secondary | ICD-10-CM | POA: Diagnosis not present

## 2021-10-13 DIAGNOSIS — I10 Essential (primary) hypertension: Secondary | ICD-10-CM

## 2021-10-13 NOTE — Patient Instructions (Addendum)
Medication Instructions:  ?Your physician recommends that you continue on your current medications as directed. Please refer to the Current Medication list given to you today.  ?*If you need a refill on your cardiac medications before your next appointment, please call your pharmacy* ? ? ?Lab Work: ?None ?If you have labs (blood work) drawn today and your tests are completely normal, you will receive your results only by: ?MyChart Message (if you have MyChart) OR ?A paper copy in the mail ?If you have any lab test that is abnormal or we need to change your treatment, we will call you to review the results. ? ? ?Testing/Procedures: ?None ? ? ?Follow-Up: ?At [facility], you and your health needs are our priority.  As part of our continuing mission to provide you with exceptional heart care, we have created designated Provider Care Teams.  These Care Teams include your primary Cardiologist (physician) and Advanced Practice Providers (APPs -  Physician Assistants and Nurse Practitioners) who all work together to provide you with the care you need, when you need it. ? ?We recommend signing up for the patient portal called "MyChart".  Sign up information is provided on this After Visit Summary.  MyChart is used to connect with patients for Virtual Visits (Telemedicine).  Patients are able to view lab/test results, encounter notes, upcoming appointments, etc.  Non-urgent messages can be sent to your provider as well.   ?To learn more about what you can do with MyChart, go to [email or web address].   ? ?Your next appointment:   ?1 year(s) ? ?The format for your next appointment:   ?In Person ? ?Provider:   ?Minus Breeding, MD   ? ?  ?

## 2021-10-30 DIAGNOSIS — N939 Abnormal uterine and vaginal bleeding, unspecified: Secondary | ICD-10-CM | POA: Diagnosis not present

## 2021-11-09 ENCOUNTER — Other Ambulatory Visit: Payer: Self-pay | Admitting: Family Medicine

## 2022-01-05 ENCOUNTER — Other Ambulatory Visit: Payer: Self-pay

## 2022-01-05 ENCOUNTER — Encounter: Payer: Self-pay | Admitting: Cardiology

## 2022-01-05 MED ORDER — BUSPIRONE HCL 10 MG PO TABS: ORAL_TABLET | ORAL | 1 refills | Status: DC

## 2022-01-09 MED ORDER — PROPRANOLOL HCL 10 MG PO TABS: 10.0000 mg | ORAL_TABLET | Freq: Three times a day (TID) | ORAL | 3 refills | Status: DC | PRN

## 2022-01-16 DIAGNOSIS — J069 Acute upper respiratory infection, unspecified: Secondary | ICD-10-CM | POA: Diagnosis not present

## 2022-01-16 DIAGNOSIS — R0981 Nasal congestion: Secondary | ICD-10-CM | POA: Diagnosis not present

## 2022-01-26 DIAGNOSIS — H04123 Dry eye syndrome of bilateral lacrimal glands: Secondary | ICD-10-CM | POA: Diagnosis not present

## 2022-01-26 DIAGNOSIS — H40033 Anatomical narrow angle, bilateral: Secondary | ICD-10-CM | POA: Diagnosis not present

## 2022-02-06 ENCOUNTER — Other Ambulatory Visit: Payer: Self-pay | Admitting: *Deleted

## 2022-02-06 MED ORDER — AZELASTINE HCL 0.1 % NA SOLN: 2.0000 | Freq: Two times a day (BID) | NASAL | 12 refills | Status: DC

## 2022-02-11 ENCOUNTER — Other Ambulatory Visit: Payer: Self-pay

## 2022-02-11 ENCOUNTER — Ambulatory Visit
Admission: EM | Admit: 2022-02-11 | Discharge: 2022-02-11 | Disposition: A | Discharge status: Home / Self Care | Payer: BC Managed Care – PPO | Attending: Internal Medicine | Admitting: Internal Medicine

## 2022-02-11 ENCOUNTER — Encounter: Payer: Self-pay | Admitting: Emergency Medicine

## 2022-02-11 DIAGNOSIS — I998 Other disorder of circulatory system: Secondary | ICD-10-CM

## 2022-02-11 NOTE — ED Triage Notes (Signed)
Patient states that she's been waking up in the middle of the night with pounding in her ears for the past 2 weeks.  Patient recording her BP 147/54, 20 minutes later BP would return to normal.    However last night BP 140/75, contacted cardiologist on call, no call back.  At 5:52am BP 160/86, 7:15am took medication Metoprolol '50mg'$  bid, BP 130/72.  8:15am, BP 119/69, 9:38am BP 155/90.  Patient is very concern regarding fluctuation in BP.

## 2022-02-11 NOTE — ED Provider Notes (Signed)
[facility]    CSN: [id number] Arrival date & time: 02/11/22  1025      History   Chief Complaint Chief Complaint  Patient presents with   BP problems    HPI Ala Kratz is a 46 y.o. female.   Patient presents today due to concerns of fluctuations in her blood pressure.  Patient notified her cardiologist on 5/26 due to waking up in the middle night with "pounding in her ear".  She took her blood pressure at the time and it was 818 systolic.  She reports that it resolved about 20 minutes later and was normal.  Her cardiologist recommended propranolol to take as needed for these episodes.  She also takes metoprolol twice daily but was encouraged to continue both by cardiology.  She has not taken any of the propranolol.  She states that she has been having these intermittent episodes with pounding in her ear that wakes her up at night with associated elevation of blood pressure.  She reports that her blood pressure is typically 299 systolic at these times.  It will then decrease on its own.  She also sometimes takes metoprolol dose which helps to decrease as well.  Denies any associated headache, chest pain, shortness of breath, dizziness, blurry vision.  She does have a history of palpitations which is why she is followed by cardiology and why she takes metoprolol.  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     Patient Active Problem List   Diagnosis Date Noted   Dyslipidemia 02/28/2021   Other fatigue 10/05/2020   Hearing loss 09/19/2020   Lipoma 06/10/2020   Allergic rhinitis 11/09/2019    Insomnia 11/09/2019   Cold sore 11/09/2019   Osteoarthritis 11/09/2019   Eye abnormality 11/09/2019   Panic attacks 04/09/2019   Palpitation 02/17/2013   Hypertension 11/22/2010    Past Surgical History:  Procedure Laterality Date   BREAST CYST ASPIRATION Bilateral 01/2020   BREAST CYST ASPIRATION Right 2017   CESAREAN SECTION     DILATION AND CURETTAGE OF UTERUS     EYE SURGERY Bilateral 2019   Laser peripheral iridotomy   EYE SURGERY Left 2018   Laser peripheral iridotomy    MOUTH SURGERY  1994   wisdom teeth    TONSILLECTOMY AND ADENOIDECTOMY      OB History   No obstetric history on file.      Home Medications    Prior to Admission medications   Medication Sig Start Date End Date Taking? Authorizing Provider  azelastine (ASTELIN) 0.1 % nasal spray Place 2 sprays into both nostrils 2 (two) times daily. 02/06/22  Yes Vivi Barrack, MD  busPIRone (BUSPAR) 10 MG tablet TAKE 1 TABLET(10 MG) BY MOUTH TWICE DAILY 01/05/22  Yes Vivi Barrack, MD  metoprolol succinate (TOPROL-XL) 50 MG 24 hr tablet TAKE 1 TABLET BY MOUTH TWICE DAILY WITH OR IMMEDIATELY FOLLOWING A MEAL 04/11/21  Yes Minus Breeding, MD  Multiple Vitamin (MULTIVITAMIN) tablet Take 1 tablet by mouth daily.   Yes [provider]  propranolol (INDERAL) 10 MG tablet Take 1 tablet (10 mg total) by mouth 3 (three) times daily as needed. 01/09/22  Yes Minus Breeding, MD  valACYclovir (VALTREX) 500 MG tablet Take 1 tablet (500 mg total) by mouth daily. 02/28/21  Yes Vivi Barrack, MD  LORazepam (ATIVAN) 0.5 MG tablet TAKE 1/2 TABLET(0.25 MG) BY MOUTH DAILY AS NEEDED FOR ANXIETY Patient not taking: Reported on 10/13/2021 04/18/21   Vivi Barrack, MD  MELATONIN PO Take by mouth. Patient not taking: Reported on 10/13/2021    [provider]    Family History Family History  Problem Relation Age of Onset   Hypertension Mother    Hyperlipidemia Mother    Diabetes Father    Hyperlipidemia Father     Hypertension Father    Kidney disease Father    Hypertension Brother    Cancer Maternal Grandmother    Cancer Maternal Grandfather    Cancer Paternal Grandmother    Heart disease Paternal Grandmother    Heart disease Paternal Grandfather    Hypertension Paternal Grandfather     Social History Social History   Tobacco Use   Smoking status: Never   Smokeless tobacco: Never  Vaping Use   Vaping Use: Never used  Substance Use Topics   Alcohol use: No   Drug use: No     Allergies   Aspirin, Augmentin [amoxicillin-pot clavulanate], Erythromycin, Levaquin [levofloxacin], Nitrofurantoin, and Azithromycin   Review of Systems Review of Systems Per HPI  Physical Exam Triage Vital Signs ED Triage Vitals  Enc Vitals Group     BP 02/11/22 1107 (!) 142/85     Pulse Rate 02/11/22 1107 68     Resp 02/11/22 1107 18     Temp 02/11/22 1107 97.9 F (36.6 C)     Temp Source 02/11/22 1107 Oral     SpO2 02/11/22 1107 98 %     Weight 02/11/22 1109 147 lb (66.7 kg)     Height 02/11/22 1109 '5\' 4"'$  (1.626 m)     Head Circumference --      Peak Flow --      Pain Score 02/11/22 1109 0     Pain Loc --      Pain Edu? --      Excl. in [place]? --    No data found.  Updated Vital Signs BP (!) 142/85 (BP Location: Left Arm)   Pulse 68   Temp 97.9 F (36.6 C) (Oral)   Resp 18   Ht '5\' 4"'$  (1.626 m)   Wt 147 lb (66.7 kg)   SpO2 98%   BMI 25.23 kg/m   Visual Acuity Right Eye Distance:   Left Eye Distance:   Bilateral Distance:    Right Eye Near:   Left Eye Near:    Bilateral Near:     Physical Exam Constitutional:      General: She is not in acute distress.    Appearance: Normal appearance. She is not toxic-appearing or diaphoretic.  HENT:     Head: Normocephalic and atraumatic.     Right Ear: Tympanic membrane and ear canal normal.     Left Ear: Tympanic membrane and ear canal normal.  Eyes:     Extraocular Movements: Extraocular movements intact.     Conjunctiva/sclera:  Conjunctivae normal.     Pupils: Pupils are equal, round, and reactive to light.  Cardiovascular:     Rate and Rhythm: Normal rate and regular rhythm.     Pulses: Normal pulses.  Heart sounds: Normal heart sounds.  Pulmonary:     Effort: Pulmonary effort is normal. No respiratory distress.     Breath sounds: Normal breath sounds.  Neurological:     General: No focal deficit present.     Mental Status: She is alert and oriented to person, place, and time. Mental status is at baseline.     Cranial Nerves: Cranial nerves 2-12 are intact.     Sensory: Sensation is intact.     Motor: Motor function is intact.     Coordination: Coordination is intact.     Gait: Gait is intact.  Psychiatric:        Mood and Affect: Mood normal.        Behavior: Behavior normal.        Thought Content: Thought content normal.        Judgment: Judgment normal.      UC Treatments / Results  Labs (all labs ordered are listed, but only abnormal results are displayed) Labs Reviewed  COMPREHENSIVE METABOLIC PANEL  CBC  TSH    EKG   Radiology No results found.  Procedures Procedures (including critical care time)  Medications Ordered in UC Medications - No data to display  Initial Impression / Assessment and Plan / UC Course  I have reviewed the triage vital signs and the nursing notes.  Pertinent labs & imaging results that were available during my care of the patient were reviewed by me and considered in my medical decision making (see chart for details).     EKG was completed to determine if fluctuation in patient's blood pressure could be from irregular heart rhythm.  This was normal sinus rhythm so no concerns with this.  There is suspicion that patient's fluctuation in blood pressure could be from anxiety.  Although, I do think this warrants further evaluation to did determine if there is any other worrisome etiologies.  Will obtain basic blood work with CMP, CBC, TSH.  Patient advised  to follow-up with her cardiologist tomorrow as soon as possible to schedule an appointment for further evaluation and management.  Patient was also given strict return and ER precautions.  Patient verbalized understanding and was agreeable with plan. Final Clinical Impressions(s) / UC Diagnoses   Final diagnoses:  Fluctuating blood pressure     Discharge Instructions      Please continue to monitor blood pressure and follow-up with cardiology.  Your EKG was normal.  Your blood work is pending.  We will call you if there are any abnormalities.  Please go to the hospital if you develop chest pain, shortness of breath, severe headache.    ED Prescriptions   None    PDMP not reviewed this encounter.   Teodora Medici, [employer] 02/11/22 1221

## 2022-02-11 NOTE — Discharge Instructions (Signed)
Please continue to monitor blood pressure and follow-up with cardiology.  Your EKG was normal.  Your blood work is pending.  We will call you if there are any abnormalities.  Please go to the hospital if you develop chest pain, shortness of breath, severe headache.

## 2022-02-12 ENCOUNTER — Telehealth: Payer: Self-pay | Admitting: Cardiology

## 2022-02-12 LAB — COMPREHENSIVE METABOLIC PANEL
ALT: 15 IU/L (ref 0–32)
AST: 14 IU/L (ref 0–40)
Albumin/Globulin Ratio: 1.4 (ref 1.2–2.2)
Albumin: 4 g/dL (ref 3.8–4.8)
Alkaline Phosphatase: 99 IU/L (ref 44–121)
BUN/Creatinine Ratio: 19 (ref 9–23)
BUN: 12 mg/dL (ref 6–24)
Bilirubin Total: 0.3 mg/dL (ref 0.0–1.2)
CO2: 22 mmol/L (ref 20–29)
Calcium: 8.7 mg/dL (ref 8.7–10.2)
Chloride: 107 mmol/L — ABNORMAL HIGH (ref 96–106)
Creatinine, Ser: 0.64 mg/dL (ref 0.57–1.00)
Globulin, Total: 2.9 g/dL (ref 1.5–4.5)
Glucose: 92 mg/dL (ref 70–99)
Potassium: 4.3 mmol/L (ref 3.5–5.2)
Sodium: 142 mmol/L (ref 134–144)
Total Protein: 6.9 g/dL (ref 6.0–8.5)
eGFR: 110 mL/min/{1.73_m2} (ref >59)

## 2022-02-12 LAB — CBC
Hematocrit: 36.9 % (ref 34.0–46.6)
Hemoglobin: 12.1 g/dL (ref 11.1–15.9)
MCH: 28.2 pg (ref 26.6–33.0)
MCHC: 32.8 g/dL (ref 31.5–35.7)
MCV: 86 fL (ref 79–97)
Platelets: 282 10*3/uL (ref 150–450)
RBC: 4.29 x10E6/uL (ref 3.77–5.28)
RDW: 13.7 % (ref 11.7–15.4)
WBC: 5.2 10*3/uL (ref 3.4–10.8)

## 2022-02-12 LAB — TSH: TSH: 1.6 u[IU]/mL (ref 0.450–4.500)

## 2022-02-12 NOTE — Telephone Encounter (Signed)
Pt c/o BP issue: STAT if pt c/o blurred vision, one-sided weakness or slurred speech  1. What are your last 5 BP readings?  02/12/22 8:15 am: 127/62 HR 71 02/11/22 11:58 pm: 123/68 HR 71 02/11/22 6:05 pm: 148/82 HR 68 02/11/22 9:45 am: 156/86 HR 69 02/11/22 7:15 am: 138/72 HR 58  2. Are you having any other symptoms (ex. Dizziness, headache, blurred vision, passed out)? Patient had a headache yesterday   3. What is your BP issue? Patient concerned about her elevated BP. She went to Urgent Care yesterday but was just told to follow up with her Cardiologist

## 2022-02-12 NOTE — Telephone Encounter (Signed)
Spoke to patient she stated B/P has been elevated.Readings listed below.Stated she went to Urgent Care yesterday and was told to be seen.Appointment scheduled with Coletta Memos NP 7/6 at 2:45 pm.Advised to bring a list of B/P readings and all medications.

## 2022-02-14 NOTE — Progress Notes (Unsigned)
Cardiology Clinic Note   Patient Name: Caroline Peterson Date of Encounter: 02/15/2022  Primary Care Provider:  Vivi Barrack, MD Primary Cardiologist:  Minus Breeding, MD  Patient Profile    Caroline Peterson 46 year old female presents to the clinic today for an evaluation of her palpitations and hypertension.  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    Past Surgical History:  Procedure Laterality Date   BREAST CYST ASPIRATION Bilateral 01/2020   BREAST CYST ASPIRATION Right 2017   CESAREAN SECTION     DILATION AND CURETTAGE OF UTERUS     EYE SURGERY Bilateral 2019   Laser peripheral iridotomy   EYE SURGERY Left 2018   Laser peripheral iridotomy    MOUTH SURGERY  1994   wisdom teeth    TONSILLECTOMY AND ADENOIDECTOMY      Allergies  Allergies  Allergen Reactions   Aspirin Nausea And Vomiting    "can take coated aspirin"   Augmentin [Amoxicillin-Pot Clavulanate]     Diarrhea   Erythromycin Nausea And Vomiting   Levaquin [Levofloxacin] Other (See Comments)    Trigger thumb; arthralgias   Nitrofurantoin Other (See Comments)    Unknown    Azithromycin Nausea And Vomiting and Palpitations    "made my heart speed up"    History of Present Illness    Caroline Peterson has a PMH of essential hypertension, allergic rhinitis, hearing loss, osteoarthritis, palpitations, panic attacks, insomnia, fatigue, and HLD.  She was seen by Dr. Percival Spanish 10/13/2021.  During that time she was presenting for follow-up evaluation of heart palpitations.  She reported that she is separated from her husband.  She was living with her mom.  While living with her parents her dad died suddenly.  She did report occasional episodes of chest tightness which was felt to be related to work stress.   Her discomfort would go away at rest.  She did also report palpitations but no sustained arrhythmias.  She denied new shortness of breath, PND or orthopnea.  She denied arm and neck discomfort.  Her blood pressure was 130/79.  Her metoprolol 50 mg twice daily was continued.  She was seen in the emergency department on 02/11/2022 for fluctuating blood pressure.  She reported that her blood pressure would typically running 585 systolic range and decreased on its own.  She would also intermittently take metoprolol which helped decrease her pressure.  She denied headache, chest pain, changes in breathing, dizziness, and vision changes.  She reported a history of palpitations.  She reported that she has been under increased amount of stress with recent death of her father.  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  Blood pressure was noted to be 142/85.  Her EKG was normal.  She presents to the clinic today for follow-up evaluation and states she is in the process of getting a divorce.  She has had increased stress related to her preteen daughter and her husband.  She reports that she has moved back home and her father is passed away.  Her grandmother has also moved back to her mother's house.  She is seeing a Social worker and has a good support group at church.  We reviewed her medications.  She is not taking propranolol with metoprolol.  We discussed  that her propranolol may be taken as needed.  She expressed understanding.  She also has a prescription for Ativan.  She does not like taking Ativan due to how it makes her feel.  We reviewed the importance of lowering stress.  She is a Animal nutritionist.  She reports that she is using stress coping techniques intermittently.  I will plan follow-up in 3 to 4 months.   Today she denies chest pain, shortness of breath, lower extremity edema, fatigue, palpitations, melena, hematuria, hemoptysis, diaphoresis, weakness, presyncope, syncope,  orthopnea, and PND.  Home Medications    Prior to Admission medications   Medication Sig Start Date End Date Taking? Authorizing Provider  azelastine (ASTELIN) 0.1 % nasal spray Place 2 sprays into both nostrils 2 (two) times daily. 02/06/22   Vivi Barrack, MD  busPIRone (BUSPAR) 10 MG tablet TAKE 1 TABLET(10 MG) BY MOUTH TWICE DAILY 01/05/22   Vivi Barrack, MD  LORazepam (ATIVAN) 0.5 MG tablet TAKE 1/2 TABLET(0.25 MG) BY MOUTH DAILY AS NEEDED FOR ANXIETY Patient not taking: Reported on 10/13/2021 04/18/21   Vivi Barrack, MD  MELATONIN PO Take by mouth. Patient not taking: Reported on 10/13/2021    [provider]  metoprolol succinate (TOPROL-XL) 50 MG 24 hr tablet TAKE 1 TABLET BY MOUTH TWICE DAILY WITH OR IMMEDIATELY FOLLOWING A MEAL 04/11/21   Minus Breeding, MD  Multiple Vitamin (MULTIVITAMIN) tablet Take 1 tablet by mouth daily.    [provider]  propranolol (INDERAL) 10 MG tablet Take 1 tablet (10 mg total) by mouth 3 (three) times daily as needed. 01/09/22   Minus Breeding, MD  valACYclovir (VALTREX) 500 MG tablet Take 1 tablet (500 mg total) by mouth daily. 02/28/21   Vivi Barrack, MD    Family History    Family History  Problem Relation Age of Onset   Hypertension Mother    Hyperlipidemia Mother    Diabetes Father    Hyperlipidemia Father    Hypertension Father    Kidney disease Father    Hypertension Brother    Cancer Maternal Grandmother    Cancer Maternal Grandfather    Cancer Paternal Grandmother    Heart disease Paternal Grandmother    Heart disease Paternal Grandfather    Hypertension Paternal Grandfather    She indicated that her mother is alive. She indicated that her father is alive. She indicated that her brother is alive. She indicated that her maternal grandmother is alive. She indicated that her maternal grandfather is deceased. She indicated that her paternal grandmother is deceased. She indicated that her paternal grandfather is  deceased.  Social History    Social History   Socioeconomic History   Marital status: Married    Spouse name: Bettyann Birchler   Number of children: 1   Years of education: Not on file   Highest education level: Not on file  Occupational History    Employer: [employer]  Tobacco Use   Smoking status: Never   Smokeless tobacco: Never  Vaping Use   Vaping Use: Never used  Substance and Sexual Activity   Alcohol use: No   Drug use: No   Sexual activity: Yes    Birth control/protection: Other-see comments  Other Topics Concern   Not on file  Social History Narrative   Lives with husband and daughter.    Social Determinants of Health   Financial Resource Strain: Not on file  Food Insecurity: Not on file  Transportation Needs: Not on file  Physical Activity: Not on file  Stress: Not on file  Social Connections: Not on file  Intimate Partner Violence: Not on file     Review of Systems    General:  No chills, fever, night sweats or weight changes.  Cardiovascular:  No chest pain, dyspnea on exertion, edema, orthopnea, palpitations, paroxysmal nocturnal dyspnea. Dermatological: No rash, lesions/masses Respiratory: No cough, dyspnea Urologic: No hematuria, dysuria Abdominal:   No nausea, vomiting, diarrhea, bright red blood per rectum, melena, or hematemesis Neurologic:  No visual changes, wkns, changes in mental status. All other systems reviewed and are otherwise negative except as noted above.  Physical Exam    VS:  BP 126/76   Pulse 76   Ht '5\' 4"'$  (1.626 m)   Wt 146 lb 9.6 oz (66.5 kg)   SpO2 99%   BMI 25.16 kg/m  , BMI Body mass index is 25.16 kg/m. GEN: Well nourished, well developed, in no acute distress. HEENT: normal. Neck: Supple, no JVD, carotid bruits, or masses. Cardiac: RRR, no murmurs, rubs, or gallops. No clubbing, cyanosis, edema.  Radials/DP/PT 2+ and equal bilaterally.  Respiratory:  Respirations regular and unlabored, clear to  auscultation bilaterally. GI: Soft, nontender, nondistended, BS + x 4. MS: no deformity or atrophy. Skin: warm and dry, no rash. Neuro:  Strength and sensation are intact. Psych: Normal affect.  Accessory Clinical Findings    Recent Labs: 02/11/2022: ALT 15; BUN 12; Creatinine, Ser 0.64; Hemoglobin 12.1; Platelets 282; Potassium 4.3; Sodium 142; TSH 1.600   Recent Lipid Panel    Component Value Date/Time   CHOL 189 02/28/2021 0858   TRIG 112.0 02/28/2021 0858   HDL 46.20 02/28/2021 0858   CHOLHDL 4 02/28/2021 0858   VLDL 22.4 02/28/2021 0858   LDLCALC 121 (H) 02/28/2021 0858    ECG personally reviewed by me today-none today.  EKG 10/13/2021 Normal sinus rhythm at 67 bpm  Assessment & Plan   1.  Hypertension-BP today 126/76.  Reports intermittent episodes of elevated systolic blood pressure in the 140s.  Recently seen in the urgent care for fluctuating blood pressure.  She has not yet tried to take any of her propranolol.  Her fluctuations in blood pressure appear to be related to anxiety and stress. Continue metoprolol Continue propranolol as needed Heart healthy low-sodium diet Increase physical activity as tolerated Maintain blood pressure log  Palpitations-notes occasional brief episodes of extra heartbeats.   Avoid triggers caffeine, chocolate, EtOH, dehydration etc. Increase physical activity as tolerated  Disposition: Follow-up with Dr. Percival Spanish or me in 3-4 months.   Jossie Ng. Adaiah Morken NP-C     02/15/2022, 4:26 PM [facility] [address] Office [phone] Fax [phone]  Notice: This dictation was prepared with Dragon dictation along with smaller phrase technology. Any transcriptional errors that result from this process are unintentional and may not be corrected upon review.  I spent 14 minutes examining this patient, reviewing medications, and using patient centered shared decision making involving her cardiac care.   Prior to her visit I spent greater than 20 minutes reviewing her past medical history,  medications, and prior cardiac tests.

## 2022-02-15 ENCOUNTER — Encounter: Payer: Self-pay | Admitting: General Practice

## 2022-02-15 ENCOUNTER — Ambulatory Visit (INDEPENDENT_AMBULATORY_CARE_PROVIDER_SITE_OTHER): Payer: BC Managed Care – PPO | Admitting: General Practice

## 2022-02-15 VITALS — BP 126/76 | HR 76 | Ht 64.0 in | Wt 146.6 lb

## 2022-02-15 DIAGNOSIS — R002 Palpitations: Secondary | ICD-10-CM | POA: Diagnosis not present

## 2022-02-15 DIAGNOSIS — I10 Essential (primary) hypertension: Secondary | ICD-10-CM | POA: Diagnosis not present

## 2022-02-15 NOTE — Patient Instructions (Addendum)
Medication Instructions:  MAKE SURE TO TAKE YOUR PROPRANOLOL AS DIRECTED  The current medical regimen is effective;  continue present plan and medications as directed. Please refer to the Current Medication list given to you today.   *If you need a refill on your cardiac medications before your next appointment, please call your pharmacy*  Lab Work:   Testing/Procedures:  NONE    NONE If you have labs (blood work) drawn today and your tests are completely normal, you will receive your results only by: [place] (if you have MyChart) OR  A paper copy in the mail If you have any lab test that is abnormal or we need to change your treatment, we will call you to review the results.  ADDITIONAL INSTRUCTIONS: INCREASE PHYSICAL ACTIVITY-AS TOLERATED   Follow-Up: Your next appointment:  3-4 month(s) In Person with Minus Breeding, MD  or Coletta Memos, FNP    At [facility], you and your health needs are our priority.  As part of our continuing mission to provide you with exceptional heart care, we have created designated Provider Care Teams.  These Care Teams include your primary Cardiologist (physician) and Advanced Practice Providers (APPs -  Physician Assistants and Nurse Practitioners) who all work together to provide you with the care you need, when you need it.  Important Information About Sugar

## 2022-02-28 ENCOUNTER — Other Ambulatory Visit: Payer: Self-pay | Admitting: *Deleted

## 2022-02-28 MED ORDER — BUSPIRONE HCL 10 MG PO TABS: ORAL_TABLET | ORAL | 1 refills | Status: DC

## 2022-03-01 ENCOUNTER — Encounter: Payer: Self-pay | Admitting: Cardiology

## 2022-03-14 ENCOUNTER — Ambulatory Visit (INDEPENDENT_AMBULATORY_CARE_PROVIDER_SITE_OTHER): Payer: BC Managed Care – PPO | Admitting: Family Medicine

## 2022-03-14 ENCOUNTER — Encounter: Payer: Self-pay | Admitting: Family Medicine

## 2022-03-14 VITALS — BP 117/80 | HR 61 | Temp 98.1°F | Ht 64.0 in | Wt 144.6 lb

## 2022-03-14 DIAGNOSIS — Z23 Encounter for immunization: Secondary | ICD-10-CM

## 2022-03-14 DIAGNOSIS — R739 Hyperglycemia, unspecified: Secondary | ICD-10-CM

## 2022-03-14 DIAGNOSIS — E785 Hyperlipidemia, unspecified: Secondary | ICD-10-CM

## 2022-03-14 DIAGNOSIS — I10 Essential (primary) hypertension: Secondary | ICD-10-CM

## 2022-03-14 DIAGNOSIS — F4329 Adjustment disorder with other symptoms: Secondary | ICD-10-CM | POA: Diagnosis not present

## 2022-03-14 DIAGNOSIS — Z0001 Encounter for general adult medical examination with abnormal findings: Secondary | ICD-10-CM | POA: Diagnosis not present

## 2022-03-14 DIAGNOSIS — G4700 Insomnia, unspecified: Secondary | ICD-10-CM | POA: Diagnosis not present

## 2022-03-14 DIAGNOSIS — I159 Secondary hypertension, unspecified: Secondary | ICD-10-CM

## 2022-03-14 DIAGNOSIS — F4320 Adjustment disorder, unspecified: Secondary | ICD-10-CM | POA: Insufficient documentation

## 2022-03-14 LAB — CBC
HCT: 34.1 % — ABNORMAL LOW (ref 36.0–46.0)
Hemoglobin: 11.3 g/dL — ABNORMAL LOW (ref 12.0–15.0)
MCHC: 33.3 g/dL (ref 30.0–36.0)
MCV: 85.2 fl (ref 78.0–100.0)
Platelets: 262 10*3/uL (ref 150.0–400.0)
RBC: 4 Mil/uL (ref 3.87–5.11)
RDW: 15.1 % (ref 11.5–15.5)
WBC: 5.4 10*3/uL (ref 4.0–10.5)

## 2022-03-14 LAB — COMPREHENSIVE METABOLIC PANEL
ALT: 15 U/L (ref 0–35)
AST: 15 U/L (ref 0–37)
Albumin: 3.9 g/dL (ref 3.5–5.2)
Alkaline Phosphatase: 83 U/L (ref 39–117)
BUN: 13 mg/dL (ref 6–23)
CO2: 26 mEq/L (ref 19–32)
Calcium: 8.2 mg/dL — ABNORMAL LOW (ref 8.4–10.5)
Chloride: 105 mEq/L (ref 96–112)
Creatinine, Ser: 0.64 mg/dL (ref 0.40–1.20)
GFR: 105.47 mL/min (ref >60.00)
Glucose, Bld: 89 mg/dL (ref 70–99)
Potassium: 3.9 mEq/L (ref 3.5–5.1)
Sodium: 140 mEq/L (ref 135–145)
Total Bilirubin: 0.5 mg/dL (ref 0.2–1.2)
Total Protein: 6.5 g/dL (ref 6.0–8.3)

## 2022-03-14 LAB — LIPID PANEL
Cholesterol: 204 mg/dL — ABNORMAL HIGH (ref 0–200)
HDL: 56 mg/dL (ref >39.00)
LDL Cholesterol: 126 mg/dL — ABNORMAL HIGH (ref 0–99)
NonHDL: 147.84
Total CHOL/HDL Ratio: 4
Triglycerides: 111 mg/dL (ref 0.0–149.0)
VLDL: 22.2 mg/dL (ref 0.0–40.0)

## 2022-03-14 LAB — CORTISOL: Cortisol, Plasma: 11.5 ug/dL

## 2022-03-14 LAB — HEMOGLOBIN A1C: Hgb A1c MFr Bld: 5.5 % (ref 4.6–6.5)

## 2022-03-14 LAB — VITAMIN B12: Vitamin B-12: 264 pg/mL (ref 211–911)

## 2022-03-14 LAB — TSH: TSH: 1.86 u[IU]/mL (ref 0.35–5.50)

## 2022-03-14 MED ORDER — TRAZODONE HCL 50 MG PO TABS: 25.0000 mg | ORAL_TABLET | Freq: Every evening | ORAL | 3 refills | Status: DC | PRN

## 2022-03-14 MED ORDER — VALACYCLOVIR HCL 500 MG PO TABS: 500.0000 mg | ORAL_TABLET | Freq: Every day | ORAL | 3 refills | Status: DC

## 2022-03-14 MED ORDER — BUSPIRONE HCL 10 MG PO TABS: ORAL_TABLET | ORAL | 11 refills | Status: DC

## 2022-03-14 NOTE — Assessment & Plan Note (Signed)
Not controlled.  She has tried over-the-counter melatonin and Benadryl without much success.  She occasionally takes Ativan which works well to help her sleep however she has some significant next-day drowsiness.  We discussed treatment options.  We will start low-dose trazodone.  She will follow-up me in a few weeks and we can titrate the dose as needed.

## 2022-03-14 NOTE — Assessment & Plan Note (Addendum)
Patient undergoing a lot of stress in her personal life due to recent passing of her father and going through a separation with her husband. She is currently seeing a Social worker. She is also on buspar '10mg'$  twice daily and ativan 0.'25mg'$  twice daily as needed.  She will continue seeing a counselor.  We will increase her BuSpar to 10 mg in the morning and 20 mg at night.  She can continue using Ativan as needed.  She will follow-up in a few weeks via MyChart.

## 2022-03-14 NOTE — Patient Instructions (Signed)
It was very nice to see you today!  We will check blood work today.  Please increase your buspar to 10m in the morning and 268min the evening.  We will try trazadone for your sleep.  Let me know in a few weeks how you are doing  Please come back in 1 year for your next physical.  Come back sooner if needed.    Take care, Dr PaJerline PainPLEASE NOTE:  If you had any lab tests please let usKoreanow if you have not heard back within a few days. You may see your results on mychart before we have a chance to review them but we will give you a call once they are reviewed by usKoreaIf we ordered any referrals today, please let usKoreanow if you have not heard from their office within the next week.   Please try these tips to maintain a healthy lifestyle:  Eat at least 3 REAL meals and 1-2 snacks per day.  Aim for no more than 5 hours between eating.  If you eat breakfast, please do so within one hour of getting up.   Each meal should contain half fruits/vegetables, one quarter protein, and one quarter carbs (no bigger than a computer mouse)  Cut down on sweet beverages. This includes juice, soda, and sweet tea.   Drink at least 1 glass of water with each meal and aim for at least 8 glasses per day  Exercise at least 150 minutes every week.    Preventive Care [id number] Old, Female Preventive care refers to lifestyle choices and visits with your health care provider that can promote health and wellness. Preventive care visits are also called wellness exams. What can I expect for my preventive care visit? Counseling Your health care provider may ask you questions about your: Medical history, including: Past medical problems. Family medical history. Pregnancy history. Current health, including: Menstrual cycle. Method of birth control. Emotional well-being. Home life and relationship well-being. Sexual activity and sexual health. Lifestyle, including: Alcohol, nicotine or tobacco, and  drug use. Access to firearms. Diet, exercise, and sleep habits. Work and work enStatisticianSunscreen use. Safety issues such as seatbelt and bike helmet use. Physical exam Your health care provider will check your: Height and weight. These may be used to calculate your BMI (body mass index). BMI is a measurement that tells if you are at a healthy weight. Waist circumference. This measures the distance around your waistline. This measurement also tells if you are at a healthy weight and may help predict your risk of certain diseases, such as type 2 diabetes and high blood pressure. Heart rate and blood pressure. Body temperature. Skin for abnormal spots. What immunizations do I need?  Vaccines are usually given at various ages, according to a schedule. Your health care provider will recommend vaccines for you based on your age, medical history, and lifestyle or other factors, such as travel or where you work. What tests do I need? Screening Your health care provider may recommend screening tests for certain conditions. This may include: Lipid and cholesterol levels. Diabetes screening. This is done by checking your blood sugar (glucose) after you have not eaten for a while (fasting). Pelvic exam and Pap test. Hepatitis B test. Hepatitis C test. HIV (human immunodeficiency virus) test. STI (sexually transmitted infection) testing, if you are at risk. Lung cancer screening. Colorectal cancer screening. Mammogram. Talk with your health care provider about when you should start having regular mammograms. This  may depend on whether you have a family history of breast cancer. BRCA-related cancer screening. This may be done if you have a family history of breast, ovarian, tubal, or peritoneal cancers. Bone density scan. This is done to screen for osteoporosis. Talk with your health care provider about your test results, treatment options, and if necessary, the need for more tests. Follow  these instructions at home: Eating and drinking  Eat a diet that includes fresh fruits and vegetables, whole grains, lean protein, and low-fat dairy products. Take vitamin and mineral supplements as recommended by your health care provider. Do not drink alcohol if: Your health care provider tells you not to drink. You are pregnant, may be pregnant, or are planning to become pregnant. If you drink alcohol: Limit how much you have to 0-1 drink a day. Know how much alcohol is in your drink. In the [place] one drink equals one 12 oz bottle of beer (355 mL), one 5 oz glass of wine (148 mL), or one 1 oz glass of hard liquor (44 mL). Lifestyle Brush your teeth every morning and night with fluoride toothpaste. Floss one time each day. Exercise for at least 30 minutes 5 or more days each week. Do not use any products that contain nicotine or tobacco. These products include cigarettes, chewing tobacco, and vaping devices, such as e-cigarettes. If you need help quitting, ask your health care provider. Do not use drugs. If you are sexually active, practice safe sex. Use a condom or other form of protection to prevent STIs. If you do not wish to become pregnant, use a form of birth control. If you plan to become pregnant, see your health care provider for a prepregnancy visit. Take aspirin only as told by your health care provider. Make sure that you understand how much to take and what form to take. Work with your health care provider to find out whether it is safe and beneficial for you to take aspirin daily. Find healthy ways to manage stress, such as: Meditation, yoga, or listening to music. Journaling. Talking to a trusted person. Spending time with friends and family. Minimize exposure to UV radiation to reduce your risk of skin cancer. Safety Always wear your seat belt while driving or riding in a vehicle. Do not drive: If you have been drinking alcohol. Do not ride with someone who has been  drinking. When you are tired or distracted. While texting. If you have been using any mind-altering substances or drugs. Wear a helmet and other protective equipment during sports activities. If you have firearms in your house, make sure you follow all gun safety procedures. Seek help if you have been physically or sexually abused. What's next? Visit your health care provider once a year for an annual wellness visit. Ask your health care provider how often you should have your eyes and teeth checked. Stay up to date on all vaccines. This information is not intended to replace advice given to you by your health care provider. Make sure you discuss any questions you have with your health care provider. Document Revised: 01/25/2021 Document Reviewed: 01/25/2021 Elsevier Patient Education  [place].

## 2022-03-14 NOTE — Addendum Note (Signed)
Addended by: Betti Cruz on: 03/14/2022 08:48 AM   Modules accepted: Orders

## 2022-03-14 NOTE — Assessment & Plan Note (Signed)
Check lipids 

## 2022-03-14 NOTE — Assessment & Plan Note (Addendum)
Follows with cardiology. She is having labile blood pressures especially at night and in the early morning.  She has already discussed this with cardiologist and her counselor.  There is consideration this could be due to panic attacks however she is concerned or something else that is going on.  She denies any snoring or other sleep apnea symptoms however sleep apnea is on the differential.  We will check some additional labs today including TSH and morning cortisol.  Advised patient to also check a blood sugar the next time she has an episode.  Above is all unremarkable we will recommend sleep study for further evaluation.

## 2022-03-14 NOTE — Progress Notes (Signed)
Chief Complaint:  Caroline Peterson is a 46 y.o. female who presents today for her annual comprehensive physical exam.    Assessment/Plan:  Chronic Problems Addressed Today: Adjustment disorder Patient undergoing a lot of stress in her personal life due to recent passing of her father and going through a separation with her husband. She is currently seeing a Social worker. She is also on buspar '10mg'$  twice daily and ativan 0.'25mg'$  twice daily as needed.  She will continue seeing a counselor.  We will increase her BuSpar to 10 mg in the morning and 20 mg at night.  She can continue using Ativan as needed.  She will follow-up in a few weeks via MyChart.  Hypertension Follows with cardiology. She is having labile blood pressures especially at night and in the early morning.  She has already discussed this with cardiologist and her counselor.  There is consideration this could be due to panic attacks however she is concerned or something else that is going on.  She denies any snoring or other sleep apnea symptoms however sleep apnea is on the differential.  We will check some additional labs today including TSH and morning cortisol.  Advised patient to also check a blood sugar the next time she has an episode.  Above is all unremarkable we will recommend sleep study for further evaluation.  Dyslipidemia Check lipids.  Insomnia Not controlled.  She has tried over-the-counter melatonin and Benadryl without much success.  She occasionally takes Ativan which works well to help her sleep however she has some significant next-day drowsiness.  We discussed treatment options.  We will start low-dose trazodone.  She will follow-up me in a few weeks and we can titrate the dose as needed.  Preventative Healthcare: Due for colon cancer screening but she deferred for today. Check labs. UTD on pap via GYN.Tdap given   Patient Counseling(The following topics were reviewed and/or handout was given):   -Nutrition: Stressed importance of moderation in sodium/caffeine intake, saturated fat and cholesterol, caloric balance, sufficient intake of fresh fruits, vegetables, and fiber.  -Stressed the importance of regular exercise.   -Substance Abuse: Discussed cessation/primary prevention of tobacco, alcohol, or other drug use; driving or other dangerous activities under the influence; availability of treatment for abuse.   -Injury prevention: Discussed safety belts, safety helmets, smoke detector, smoking near bedding or upholstery.   -Sexuality: Discussed sexually transmitted diseases, partner selection, use of condoms, avoidance of unintended pregnancy and contraceptive alternatives.   -Dental health: Discussed importance of regular tooth brushing, flossing, and dental visits.  -Health maintenance and immunizations reviewed. Please refer to Health maintenance section.  Return to care in 1 year for next preventative visit.     Subjective:  HPI:  She has been having issues with labile blood pressures at night.  This started several months ago.  She has seen cardiology for this who thought is more related to her anxiety.  She is concerned it may be due to a "physical" problem.  She will wake up in the night with a headache and a pounding sensation in her ears.  She will check her blood pressure at this time and it can sometimes be as elevated as 160/100.  She will then take a propranolol that was prescribed to her by cardiology and symptoms will resolve.  This started after having upper respiratory infection.  No significant chest pain.  No shortness of breath.  No other obvious precipitating or alleviating factors.     03/14/2022  8:09 AM  Depression screen PHQ 2/9  Decreased Interest 0  Down, Depressed, Hopeless 0  PHQ - 2 Score 0    Health Maintenance Due  Topic Date Due   TETANUS/TDAP  08/14/2019     ROS: Per HPI, otherwise a complete review of systems was negative.   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    Patient Active Problem List   Diagnosis Date Noted   Adjustment disorder 03/14/2022   Dyslipidemia 02/28/2021   Other fatigue 10/05/2020   Hearing loss 09/19/2020   Lipoma 06/10/2020   Allergic rhinitis 11/09/2019   Insomnia 11/09/2019   Cold sore 11/09/2019   Osteoarthritis 11/09/2019   Eye abnormality 11/09/2019   Panic attacks 04/09/2019   Palpitation 02/17/2013   Hypertension 11/22/2010   Past Surgical History:  Procedure Laterality Date   BREAST CYST ASPIRATION Bilateral 01/2020   BREAST CYST ASPIRATION Right 2017   CESAREAN SECTION     DILATION AND CURETTAGE OF UTERUS     EYE SURGERY Bilateral 2019   Laser peripheral iridotomy   EYE SURGERY Left 2018   Laser peripheral iridotomy    MOUTH SURGERY  1994   wisdom teeth    TONSILLECTOMY AND ADENOIDECTOMY      Family History  Problem Relation Age of Onset   Hypertension Mother    Hyperlipidemia Mother    Diabetes Father    Hyperlipidemia Father    Hypertension Father    Kidney disease Father    Hypertension Brother    Cancer Maternal Grandmother    Cancer Maternal Grandfather    Cancer Paternal Grandmother    Heart disease Paternal Grandmother    Heart disease Paternal Grandfather    Hypertension Paternal Grandfather     Medications- reviewed and updated Current Outpatient Medications  Medication Sig Dispense Refill   azelastine (ASTELIN) 0.1 % nasal spray Place 2 sprays into both nostrils 2 (two) times daily. 30 mL 12   LORazepam (ATIVAN) 0.5 MG tablet TAKE 1/2 TABLET(0.25 MG) BY MOUTH DAILY AS NEEDED FOR ANXIETY 30 tablet 5   metoprolol succinate (TOPROL-XL) 50 MG 24 hr tablet TAKE 1 TABLET BY MOUTH TWICE DAILY WITH OR IMMEDIATELY FOLLOWING A MEAL 180 tablet 3   Multiple Vitamin  (MULTIVITAMIN) tablet Take 1 tablet by mouth daily.     traZODone (DESYREL) 50 MG tablet Take 0.5-1 tablets (25-50 mg total) by mouth at bedtime as needed for sleep. 30 tablet 3   busPIRone (BUSPAR) 10 MG tablet Take '10mg'$  in the morning and '20mg'$  at night. 90 tablet 11   valACYclovir (VALTREX) 500 MG tablet Take 1 tablet (500 mg total) by mouth daily. 90 tablet 3   No current facility-administered medications for this visit.    Allergies-reviewed and updated Allergies  Allergen Reactions   Aspirin Nausea And Vomiting    "can take coated aspirin"   Augmentin [Amoxicillin-Pot Clavulanate]     Diarrhea   Erythromycin Nausea And Vomiting   Levaquin [Levofloxacin] Other (See Comments)    Trigger thumb; arthralgias   Nitrofurantoin Other (See Comments)    Unknown    Azithromycin Nausea And Vomiting and Palpitations    "made my heart speed up"    Social History   Socioeconomic History   Marital status: Married    Spouse name: Klaudia Beirne  Number of children: 1   Years of education: Not on file   Highest education level: Not on file  Occupational History    Employer: [employer]  Tobacco Use   Smoking status: Never   Smokeless tobacco: Never  Vaping Use   Vaping Use: Never used  Substance and Sexual Activity   Alcohol use: No   Drug use: No   Sexual activity: Yes    Birth control/protection: Other-see comments  Other Topics Concern   Not on file  Social History Narrative   Lives with husband and daughter.    Social Determinants of Health   Financial Resource Strain: Not on file  Food Insecurity: Not on file  Transportation Needs: Not on file  Physical Activity: Not on file  Stress: Not on file  Social Connections: Not on file        Objective:  Physical Exam: BP 117/80   Pulse 61   Temp 98.1 F (36.7 C) (Temporal)   Ht '5\' 4"'$  (1.626 m)   Wt 144 lb 9.6 oz (65.6 kg)   LMP 03/02/2022   SpO2 100%   BMI 24.82 kg/m   Body mass index is 24.82  kg/m. Wt Readings from Last 3 Encounters:  03/14/22 144 lb 9.6 oz (65.6 kg)  02/15/22 146 lb 9.6 oz (66.5 kg)  02/11/22 147 lb (66.7 kg)   Gen: NAD, resting comfortably HEENT: TMs normal bilaterally. OP clear. No thyromegaly noted.  CV: RRR with no murmurs appreciated Pulm: NWOB, CTAB with no crackles, wheezes, or rhonchi GI: Normal bowel sounds present. Soft, Nontender, Nondistended. MSK: no edema, cyanosis, or clubbing noted Skin: warm, dry Neuro: CN2-12 grossly intact. Strength 5/5 in upper and lower extremities. Reflexes symmetric and intact bilaterally.  Psych: Normal affect and thought content     Dalissa Lovin M. Jerline Pain, MD 03/14/2022 8:42 AM

## 2022-03-15 LAB — C-PEPTIDE: C-Peptide: 1.35 ng/mL (ref 0.80–3.85)

## 2022-03-16 NOTE — Progress Notes (Signed)
Please inform patient of the following:  Cholesterol is up a bit since last time.  We can recheck in a year.  She should work on diet and exercise.  She is a little anemic.  Recommend she come back in a few weeks to recheck CBC with iron panel.  The rest of her labs are all normal.  No clear explanation for the nighttime episodes that she was having.  Recommend referral for sleep study as we discussed at her office visit.  They can recheck the cholesterol blood work near.

## 2022-03-17 ENCOUNTER — Other Ambulatory Visit: Payer: Self-pay | Admitting: *Deleted

## 2022-03-17 DIAGNOSIS — D649 Anemia, unspecified: Secondary | ICD-10-CM

## 2022-03-20 LAB — HM MAMMOGRAPHY

## 2022-03-22 DIAGNOSIS — H906 Mixed conductive and sensorineural hearing loss, bilateral: Secondary | ICD-10-CM | POA: Diagnosis not present

## 2022-03-22 DIAGNOSIS — H93A3 Pulsatile tinnitus, bilateral: Secondary | ICD-10-CM | POA: Diagnosis not present

## 2022-03-22 DIAGNOSIS — J320 Chronic maxillary sinusitis: Secondary | ICD-10-CM | POA: Diagnosis not present

## 2022-03-23 ENCOUNTER — Encounter: Payer: Self-pay | Admitting: Family Medicine

## 2022-03-27 ENCOUNTER — Encounter: Payer: Self-pay | Admitting: Family Medicine

## 2022-03-28 NOTE — Telephone Encounter (Signed)
I appreciate the update. I am glad that the trazodone is working well. We can continue her medications as prescribed for now.   Hopefully her MRI will come back normal! They should be sending [place] the records once it is complete.

## 2022-03-29 ENCOUNTER — Other Ambulatory Visit (INDEPENDENT_AMBULATORY_CARE_PROVIDER_SITE_OTHER): Payer: BC Managed Care – PPO

## 2022-03-29 DIAGNOSIS — D649 Anemia, unspecified: Secondary | ICD-10-CM | POA: Diagnosis not present

## 2022-03-29 LAB — CBC
HCT: 33.3 % — ABNORMAL LOW (ref 36.0–46.0)
Hemoglobin: 11 g/dL — ABNORMAL LOW (ref 12.0–15.0)
MCHC: 33 g/dL (ref 30.0–36.0)
MCV: 84.4 fl (ref 78.0–100.0)
Platelets: 218 10*3/uL (ref 150.0–400.0)
RBC: 3.94 Mil/uL (ref 3.87–5.11)
RDW: 14.5 % (ref 11.5–15.5)
WBC: 6.4 10*3/uL (ref 4.0–10.5)

## 2022-03-29 LAB — IBC + FERRITIN
Ferritin: 3.8 ng/mL — ABNORMAL LOW (ref 10.0–291.0)
Iron: 47 ug/dL (ref 42–145)
Saturation Ratios: 11.2 % — ABNORMAL LOW (ref 20.0–50.0)
TIBC: 420 ug/dL (ref 250.0–450.0)
Transferrin: 300 mg/dL (ref 212.0–360.0)

## 2022-03-30 NOTE — Progress Notes (Signed)
Please inform patient of the following:  Blood work confirms that she has low iron.  This is probably due to blood loss from her menstrual cycles however it is very important that she get colon cancer screening done ASAP as this can also be another source of iron deficiency.  Please place referral for colonoscopy if she is agreeable.  She should start taking iron supplement ferrous sulfate 65 mg every other day.  We can recheck in 3 to 6 months.

## 2022-04-03 ENCOUNTER — Other Ambulatory Visit: Payer: Self-pay | Admitting: *Deleted

## 2022-04-03 ENCOUNTER — Telehealth: Payer: Self-pay | Admitting: Family Medicine

## 2022-04-03 DIAGNOSIS — D649 Anemia, unspecified: Secondary | ICD-10-CM

## 2022-04-03 NOTE — Telephone Encounter (Signed)
Patient is requesting a call back from PCP team to explain lab results collected on 03/29/22. Patient can be reached at [phone].

## 2022-04-04 ENCOUNTER — Encounter: Payer: Self-pay | Admitting: Gastroenterology

## 2022-04-04 NOTE — Telephone Encounter (Signed)
See results note. 

## 2022-04-20 DIAGNOSIS — H93A3 Pulsatile tinnitus, bilateral: Secondary | ICD-10-CM | POA: Diagnosis not present

## 2022-05-04 DIAGNOSIS — E042 Nontoxic multinodular goiter: Secondary | ICD-10-CM | POA: Diagnosis not present

## 2022-05-04 DIAGNOSIS — J320 Chronic maxillary sinusitis: Secondary | ICD-10-CM | POA: Diagnosis not present

## 2022-05-04 DIAGNOSIS — H906 Mixed conductive and sensorineural hearing loss, bilateral: Secondary | ICD-10-CM | POA: Diagnosis not present

## 2022-05-04 DIAGNOSIS — H93A3 Pulsatile tinnitus, bilateral: Secondary | ICD-10-CM | POA: Diagnosis not present

## 2022-05-09 ENCOUNTER — Encounter: Payer: Self-pay | Admitting: Family Medicine

## 2022-05-09 ENCOUNTER — Encounter: Payer: Self-pay | Admitting: Gastroenterology

## 2022-05-09 ENCOUNTER — Ambulatory Visit (INDEPENDENT_AMBULATORY_CARE_PROVIDER_SITE_OTHER): Payer: BC Managed Care – PPO | Admitting: Gastroenterology

## 2022-05-09 ENCOUNTER — Ambulatory Visit (INDEPENDENT_AMBULATORY_CARE_PROVIDER_SITE_OTHER): Payer: BC Managed Care – PPO | Admitting: Family Medicine

## 2022-05-09 VITALS — BP 102/80 | HR 67 | Ht 64.0 in | Wt 146.2 lb

## 2022-05-09 VITALS — BP 113/76 | HR 67 | Temp 97.5°F | Ht 64.0 in | Wt 145.4 lb

## 2022-05-09 DIAGNOSIS — E611 Iron deficiency: Secondary | ICD-10-CM

## 2022-05-09 DIAGNOSIS — I672 Cerebral atherosclerosis: Secondary | ICD-10-CM

## 2022-05-09 DIAGNOSIS — N939 Abnormal uterine and vaginal bleeding, unspecified: Secondary | ICD-10-CM | POA: Diagnosis not present

## 2022-05-09 DIAGNOSIS — Z886 Allergy status to analgesic agent status: Secondary | ICD-10-CM | POA: Diagnosis not present

## 2022-05-09 DIAGNOSIS — E785 Hyperlipidemia, unspecified: Secondary | ICD-10-CM | POA: Diagnosis not present

## 2022-05-09 DIAGNOSIS — J310 Chronic rhinitis: Secondary | ICD-10-CM | POA: Diagnosis not present

## 2022-05-09 DIAGNOSIS — J343 Hypertrophy of nasal turbinates: Secondary | ICD-10-CM | POA: Diagnosis not present

## 2022-05-09 DIAGNOSIS — Z79899 Other long term (current) drug therapy: Secondary | ICD-10-CM | POA: Diagnosis not present

## 2022-05-09 DIAGNOSIS — J342 Deviated nasal septum: Secondary | ICD-10-CM | POA: Diagnosis not present

## 2022-05-09 DIAGNOSIS — D509 Iron deficiency anemia, unspecified: Secondary | ICD-10-CM

## 2022-05-09 DIAGNOSIS — J339 Nasal polyp, unspecified: Secondary | ICD-10-CM | POA: Diagnosis not present

## 2022-05-09 DIAGNOSIS — R0981 Nasal congestion: Secondary | ICD-10-CM | POA: Diagnosis not present

## 2022-05-09 DIAGNOSIS — Z880 Allergy status to penicillin: Secondary | ICD-10-CM | POA: Diagnosis not present

## 2022-05-09 DIAGNOSIS — Z881 Allergy status to other antibiotic agents status: Secondary | ICD-10-CM | POA: Diagnosis not present

## 2022-05-09 DIAGNOSIS — J341 Cyst and mucocele of nose and nasal sinus: Secondary | ICD-10-CM | POA: Diagnosis not present

## 2022-05-09 MED ORDER — HYDROCORTISONE (PERIANAL) 2.5 % EX CREA: 1.0000 | TOPICAL_CREAM | Freq: Three times a day (TID) | CUTANEOUS | 1 refills | Status: DC

## 2022-05-09 MED ORDER — NA SULFATE-K SULFATE-MG SULF 17.5-3.13-1.6 GM/177ML PO SOLN: 1.0000 | Freq: Once | ORAL | 0 refills | Status: AC

## 2022-05-09 MED ORDER — ATORVASTATIN CALCIUM 40 MG PO TABS: 40.0000 mg | ORAL_TABLET | Freq: Every day | ORAL | 3 refills | Status: DC

## 2022-05-09 NOTE — Patient Instructions (Signed)
It was very nice to see you today!  We will start Lipitor.  Let me know if you have any side effects of this.  Please come back in 6 months to recheck blood work.  Take care, Dr Jerline Pain  PLEASE NOTE:  If you had any lab tests please let us know if you have not heard back within a few days. You may see your results on mychart before we have a chance to review them but we will give you a call once they are reviewed by [place]. If we ordered any referrals today, please let us know if you have not heard from their office within the next week.   Please try these tips to maintain a healthy lifestyle:  Eat at least 3 REAL meals and 1-2 snacks per day.  Aim for no more than 5 hours between eating.  If you eat breakfast, please do so within one hour of getting up.   Each meal should contain half fruits/vegetables, one quarter protein, and one quarter carbs (no bigger than a computer mouse)  Cut down on sweet beverages. This includes juice, soda, and sweet tea.   Drink at least 1 glass of water with each meal and aim for at least 8 glasses per day  Exercise at least 150 minutes every week.  We

## 2022-05-09 NOTE — Progress Notes (Signed)
05/09/2022 Caroline Peterson [id number] 1975-12-31   HISTORY OF PRESENT ILLNESS: This is a 46 year old female who is new to our office.  She has been referred here by her PCP, Dr. Jerline Pain, for evaluation of "anemia".  She is actually not anemic, her hemoglobin has remained in the 12 g range dating back several years.  Her iron levels are low, however.  She was recently started on oral iron supplement.  She says that she has not ever seen any blood in her stools and was not having any dark stools until starting the iron supplement.  She says that she has long heavy menstrual cycles, the last one lasting 3 weeks with clots.  She is actually going to see her GYN later today as well in regards to that.  She says that the iron supplement has also been causing her to have diarrhea and some nausea.  She has been on it since August.  She never had a colonoscopy in the past.  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  They wanted to colonoscopy, but she never proceeded.  She also reports issues with hemorrhoids and is concerned about that is getting irritated when she has to do colonoscopy prep.   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 She has never used smokeless tobacco. She reports that she does not drink alcohol and does not use drugs. family history includes Cancer in her maternal  grandfather, maternal grandmother, and paternal grandmother; Diabetes in her father; Heart disease in her paternal grandfather and paternal grandmother; Hyperlipidemia in her father and mother; Hypertension in her brother, father, mother, and paternal grandfather; Kidney disease in her father. Allergies  Allergen Reactions   Aspirin Nausea And Vomiting    "can take coated aspirin"   Augmentin [Amoxicillin-Pot Clavulanate]     Diarrhea   Erythromycin Nausea And Vomiting   Levaquin [Levofloxacin] Other (See Comments)    Trigger thumb; arthralgias   Nitrofurantoin Other (See Comments)    Unknown    Azithromycin Nausea And Vomiting and Palpitations    "made my heart speed up"      Outpatient Encounter Medications as of 05/09/2022  Medication Sig   atorvastatin (LIPITOR) 40 MG tablet Take 1 tablet (40 mg total) by mouth daily.   azelastine (ASTELIN) 0.1 % nasal spray Place 2 sprays into both nostrils 2 (two) times daily.   busPIRone (BUSPAR) 10 MG tablet Take '10mg'$  in the morning and '20mg'$  at night.   ferrous sulfate 324 (65 Fe) MG TBEC Take 1 tablet by mouth every other day.   LORazepam (ATIVAN) 0.5 MG tablet TAKE 1/2 TABLET(0.25 MG) BY MOUTH DAILY AS NEEDED FOR ANXIETY   metoprolol succinate (TOPROL-XL) 50 MG 24 hr tablet TAKE 1 TABLET BY MOUTH TWICE DAILY  WITH OR IMMEDIATELY FOLLOWING A MEAL   Multiple Vitamin (MULTIVITAMIN) tablet Take 1 tablet by mouth daily.   traZODone (DESYREL) 50 MG tablet Take 0.5-1 tablets (25-50 mg total) by mouth at bedtime as needed for sleep.   valACYclovir (VALTREX) 500 MG tablet Take 1 tablet (500 mg total) by mouth daily.   No facility-administered encounter medications on file as of 05/09/2022.     REVIEW OF SYSTEMS  : All other systems reviewed and negative except where noted in the History of Present Illness.   PHYSICAL EXAM: BP 102/80 (BP Location: Right Arm, Patient Position: Sitting, Cuff Size: Normal)   Pulse 67   Ht '5\' 4"'$  (1.626 m)   Wt 146 lb  3.2 oz (66.3 kg)   SpO2 99%   BMI 25.10 kg/m  General: Well developed white female in no acute distress Head: Normocephalic and atraumatic Eyes:  Sclerae anicteric, conjunctiva pink. Ears: Normal auditory acuity Lungs: Clear throughout to auscultation; no W/R/R. Heart: Regular rate and rhythm; no M/R/G. Abdomen: Soft, non-distended.  BS present.  Non-tender. Rectal:  Will be done at the time of colonoscopy. Musculoskeletal: Symmetrical with no gross deformities  Skin: No lesions on visible extremities Extremities: No edema  Neurological: Alert oriented x 4, grossly non-focal Psychological:  Alert and cooperative. Normal mood and affect  ASSESSMENT AND PLAN: *Iron deficiency: Hemoglobin is normal, but iron levels are low.  She denies seeing any blood in her stools and did not have any dark stools until starting the oral iron supplement.  She does have very long and heavy menstrual cycles, which is what she thinks is causing iron deficiency.  She has she has an appointment with her GYN later today as well.  Nonetheless, she has never had a colonoscopy.  We will plan for colonoscopy with Dr. Tarri Glenn.  We will do an upper endoscopy as well for completion.  The risks, benefits, and alternatives to EGD and colonoscopy were discussed with the patient and she consents to proceed.   *Hemorrhoids: She reports issues with hemorrhoids and would like something to use on them for when she has to do the colonoscopy prep.  We will send a prescription for hydrocortisone 2.5% cream to her pharmacy.   CC:  Vivi Barrack, MD

## 2022-05-09 NOTE — Patient Instructions (Signed)
You have been scheduled for an endoscopy and colonoscopy. Please follow the written instructions given to you at your visit today. Please pick up your prep supplies at the pharmacy within the next 1-3 days. If you use inhalers (even only as needed), please bring them with you on the day of your procedure.  _______________________________________________________  If you are age 47 or older, your body mass index should be between 23-30. Your Body mass index is 25.1 kg/m. If this is out of the aforementioned range listed, please consider follow up with your Primary Care Provider.  If you are age 104 or younger, your body mass index should be between 19-25. Your Body mass index is 25.1 kg/m. If this is out of the aformentioned range listed, please consider follow up with your Primary Care Provider.   ________________________________________________________  The [facility] providers would like to encourage you to use [facility] to communicate with providers for non-urgent requests or questions.  Due to long hold times on the telephone, sending your provider a message by [facility] may be a faster and more efficient way to get a response.  Please allow 48 business hours for a response.  Please remember that this is for non-urgent requests.  _______________________________________________________  I appreciate the opportunity to care for you. Alonza Bogus, PA-C

## 2022-05-09 NOTE — Assessment & Plan Note (Signed)
I had a lengthy discussion with patient regarding her incidental finding on CT scan.  Overall does not have any significant risk factors and her 10-year ASCVD risk is 1% however she does have strong family history of vascular disease.  We discussed risk and benefits of starting statin and it would be reasonable to start a statin at this point.  We will start Lipitor 40 mg daily.

## 2022-05-09 NOTE — Progress Notes (Signed)
   Caroline Peterson is a 46 y.o. female who presents today for an office visit.  Assessment/Plan:  Chronic Problems Addressed Today: Intracranial atherosclerosis I had a lengthy discussion with patient regarding her incidental finding on CT scan.  Overall does not have any significant risk factors and her 10-year ASCVD risk is 1% however she does have strong family history of vascular disease.  We discussed risk and benefits of starting statin and it would be reasonable to start a statin at this point.  We will start Lipitor 40 mg daily.  Dyslipidemia Last LDL 126 about a month ago though we will be starting statin as above due to incidental finding of the age advanced intracranial atherosclerosis on recent CT scan.  We will start Lipitor 40 mg daily.  We discussed potential side effects.  She will come back in 3 to 6 months and we can recheck lipid panel at that point.     Subjective:  HPI:  Patient here for follow-up.  Since her last visit she has seen ENT for pulsatile tendinitis.  She did have a CT scan performed last year which showed intracranial atherosclerosis.  Her ENT doctor recommended she start a statin.  She is here to discuss this today.  She does have a family history of vascular disease.  She was also found to have some thyroid nodules and a nasal polyp that are being followed by ENT.       Objective:  Physical Exam: BP 113/76   Pulse 67   Temp (!) 97.5 F (36.4 C) (Temporal)   Ht '5\' 4"'$  (1.626 m)   Wt 145 lb 6.4 oz (66 kg)   LMP 04/05/2022   SpO2 100%   BMI 24.96 kg/m   Gen: No acute distress, resting comfortably Neuro: Grossly normal, moves all extremities Psych: Normal affect and thought content      Caroline Peterson M. Jerline Pain, MD 05/09/2022 8:09 AM

## 2022-05-09 NOTE — Assessment & Plan Note (Signed)
Last LDL 126 about a month ago though we will be starting statin as above due to incidental finding of the age advanced intracranial atherosclerosis on recent CT scan.  We will start Lipitor 40 mg daily.  We discussed potential side effects.  She will come back in 3 to 6 months and we can recheck lipid panel at that point.

## 2022-05-11 NOTE — Progress Notes (Signed)
Reviewed and agree with management plans. ? ?Gracielynn Birkel L. Gauge Winski, MD, MPH  ?

## 2022-05-18 ENCOUNTER — Ambulatory Visit: Payer: BC Managed Care – PPO | Admitting: Cardiology

## 2022-05-23 ENCOUNTER — Telehealth: Payer: Self-pay | Admitting: Family Medicine

## 2022-05-23 NOTE — Telephone Encounter (Signed)
Patient states the  ferrous sulfate 324 (65 Fe) MG TBEC  is working very well, however, Patient is having uncontrollable diarrhea as a side effect from the above medication.   Patient requests to be called at ph# [phone]  to be advised.

## 2022-05-24 ENCOUNTER — Encounter: Payer: Self-pay | Admitting: Family Medicine

## 2022-05-24 NOTE — Telephone Encounter (Signed)
See note

## 2022-05-25 NOTE — Telephone Encounter (Signed)
I called pt to inform her on your suggestion, pt says she has been doing as much. Says its not helping, pt did state that the Rx has helped with pounding in her head, but the diarrhea is so bad and getting worse. Pt says some days she cant even make it to the bathroom in time.

## 2022-05-25 NOTE — Telephone Encounter (Signed)
She can try taking with food to see if this helps.  Algis Greenhouse. Jerline Pain, MD 05/25/2022 7:35 AM

## 2022-05-25 NOTE — Telephone Encounter (Signed)
CAn we clarify? The iron supplement it to help with her anemia. Recommend an office visit to discuss if she has further questions.  Algis Greenhouse. Jerline Pain, MD 05/25/2022 8:14 AM

## 2022-05-28 NOTE — Telephone Encounter (Signed)
Please advise 

## 2022-05-28 NOTE — Telephone Encounter (Signed)
Patient schedule virtual appointment on 05/31/2022

## 2022-05-29 NOTE — Telephone Encounter (Signed)
Please see phone note.  Algis Greenhouse. Jerline Pain, MD 05/29/2022 1:08 PM

## 2022-05-31 ENCOUNTER — Telehealth (INDEPENDENT_AMBULATORY_CARE_PROVIDER_SITE_OTHER): Payer: BC Managed Care – PPO | Admitting: Family Medicine

## 2022-05-31 ENCOUNTER — Encounter: Payer: Self-pay | Admitting: Family Medicine

## 2022-05-31 VITALS — Ht 64.0 in | Wt 143.0 lb

## 2022-05-31 DIAGNOSIS — N946 Dysmenorrhea, unspecified: Secondary | ICD-10-CM | POA: Diagnosis not present

## 2022-05-31 DIAGNOSIS — E611 Iron deficiency: Secondary | ICD-10-CM | POA: Diagnosis not present

## 2022-05-31 NOTE — Progress Notes (Signed)
   Caroline Peterson is a 46 y.o. female who presents today for a virtual office visit.  Assessment/Plan:  Chronic Problems Addressed Today: Iron deficiency Does not able to tolerate ferrous sulfate 65 mg every other day due to GI side effects.  Recommend she switch to ferrous gluconate 34 mg every other day.  She will check with me in a few weeks.  If continues to have GI side effects we will consider trial of ferrous fumarate versus having her set up for iron infusions.  We discussed foods rich in iron.  She can come back in few months to recheck iron levels.  Dysmenorrhea Continue management per GYN.  Contributing to her iron deficiency.  Was recently started on progestin only however has not had much benefit.     Subjective:  HPI:  See A/p for status of chronic conditions.  We saw her several weeks ago for her annual physical.  Was found to have iron deficiency anemia.  She has been following with GYN to help control menstrual cycles however still has persistent bleeding.  She was also referred for colonoscopy.  We started her on ferrous sulfate 65 mg every other day.  This initially caused a lot of upset stomach and cramping.  She then started to take with food.  She still has persistent acid reflux, cramping, and diarrhea after taking.       Objective/Observations  Physical Exam: Gen: NAD, resting comfortably Pulm: Normal work of breathing Neuro: Grossly normal, moves all extremities Psych: Normal affect and thought content  Virtual Visit via Video   I connected with Caroline Peterson on 05/31/22 at  8:40 AM EDT by a video enabled telemedicine application and verified that I am speaking with the correct person using two identifiers. The limitations of evaluation and management by telemedicine and the availability of in person appointments were discussed. The patient expressed understanding and agreed to proceed.   Patient location: Home Provider  location: [place] participating in the virtual visit: Myself and Patient     Algis Greenhouse. Jerline Pain, MD 05/31/2022 9:05 AM

## 2022-05-31 NOTE — Patient Instructions (Addendum)
It was very nice to see you today!  Please start ferrous gluconate 34 to 37 mg every other day.  Let me know in a few weeks how you are doing.  Take care, Dr Jerline Pain  PLEASE NOTE:  If you had any lab tests please let us know if you have not heard back within a few days. You may see your results on mychart before we have a chance to review them but we will give you a call once they are reviewed by [place]. If we ordered any referrals today, please let us know if you have not heard from their office within the next week.   Please try these tips to maintain a healthy lifestyle:  Eat at least 3 REAL meals and 1-2 snacks per day.  Aim for no more than 5 hours between eating.  If you eat breakfast, please do so within one hour of getting up.   Each meal should contain half fruits/vegetables, one quarter protein, and one quarter carbs (no bigger than a computer mouse)  Cut down on sweet beverages. This includes juice, soda, and sweet tea.   Drink at least 1 glass of water with each meal and aim for at least 8 glasses per day  Exercise at least 150 minutes every week.

## 2022-05-31 NOTE — Assessment & Plan Note (Signed)
Does not able to tolerate ferrous sulfate 65 mg every other day due to GI side effects.  Recommend she switch to ferrous gluconate 34 mg every other day.  She will check with me in a few weeks.  If continues to have GI side effects we will consider trial of ferrous fumarate versus having her set up for iron infusions.  We discussed foods rich in iron.  She can come back in few months to recheck iron levels.

## 2022-05-31 NOTE — Assessment & Plan Note (Signed)
Continue management per GYN.  Contributing to her iron deficiency.  Was recently started on progestin only however has not had much benefit.

## 2022-06-01 NOTE — Telephone Encounter (Signed)
Since she is having some side effect we can start with the lowest dose '27mg'$  and see how she does with that.  Algis Greenhouse. Jerline Pain, MD 06/01/2022 7:30 AM

## 2022-06-05 ENCOUNTER — Other Ambulatory Visit: Payer: Self-pay | Admitting: Cardiology

## 2022-06-05 NOTE — Telephone Encounter (Signed)
Rx refill sent to pharmacy. 

## 2022-06-19 ENCOUNTER — Ambulatory Visit (AMBULATORY_SURGERY_CENTER): Payer: BC Managed Care – PPO | Admitting: Gastroenterology

## 2022-06-19 ENCOUNTER — Encounter: Payer: Self-pay | Admitting: Gastroenterology

## 2022-06-19 VITALS — BP 123/81 | HR 64 | Temp 97.5°F | Resp 12 | Ht 64.0 in | Wt 146.0 lb

## 2022-06-19 DIAGNOSIS — Z1211 Encounter for screening for malignant neoplasm of colon: Secondary | ICD-10-CM

## 2022-06-19 DIAGNOSIS — E611 Iron deficiency: Secondary | ICD-10-CM | POA: Diagnosis not present

## 2022-06-19 DIAGNOSIS — K219 Gastro-esophageal reflux disease without esophagitis: Secondary | ICD-10-CM | POA: Diagnosis not present

## 2022-06-19 DIAGNOSIS — D124 Benign neoplasm of descending colon: Secondary | ICD-10-CM

## 2022-06-19 DIAGNOSIS — K2289 Other specified disease of esophagus: Secondary | ICD-10-CM | POA: Diagnosis not present

## 2022-06-19 DIAGNOSIS — K635 Polyp of colon: Secondary | ICD-10-CM

## 2022-06-19 HISTORY — PX: COLONOSCOPY WITH ESOPHAGOGASTRODUODENOSCOPY (EGD): SHX5779

## 2022-06-19 MED ORDER — SODIUM CHLORIDE 0.9 % IV SOLN: 500.0000 mL | Freq: Once | INTRAVENOUS | Status: DC

## 2022-06-19 NOTE — Op Note (Addendum)
[facility] Patient Name: Caroline Peterson Procedure Date: 06/19/2022 10:01 AM MRN: [id number] Endoscopist: Thornton Park MD, MD, [id number] Age: 46 Referring MD:  Date of Birth: 01/04/1976 Gender: Female Account #: [id number] Procedure:                Upper GI endoscopy Indications:              Unexplained iron deficiency anemia Medicines:                Monitored Anesthesia Care Procedure:                Pre-Anesthesia Assessment:                           - Prior to the procedure, a History and Physical                            was performed, and patient medications and                            allergies were reviewed. The patient's tolerance of                            previous anesthesia was also reviewed. The risks                            and benefits of the procedure and the sedation                            options and risks were discussed with the patient.                            All questions were answered, and informed consent                            was obtained. Prior Anticoagulants: The patient has                            taken no anticoagulant or antiplatelet agents. ASA                            Grade Assessment: II - A patient with mild systemic                            disease. After reviewing the risks and benefits,                            the patient was deemed in satisfactory condition to                            undergo the procedure.                           After obtaining informed consent, the endoscope was  passed under direct vision. Throughout the                            procedure, the patient's blood pressure, pulse, and                            oxygen saturations were monitored continuously. The                            GIF [id number] #[id number] was introduced through the                            mouth, and advanced to the third part of duodenum.                            The upper  GI endoscopy was accomplished without                            difficulty. The patient tolerated the procedure                            well. Scope In: Scope Out: Findings:                 Localized mild mucosal changes characterized by                            congestion were found at the gastroesophageal                            junction. Biopsies were taken from the distal                            esophagus with a cold forceps for histology.                            Estimated blood loss was minimal.                           Patchy mild mucosal changes characterized by                            erythema, granularity and a decreased vascular                            pattern were found in the gastric body and in the                            gastric antrum. Biopsies were taken from the                            antrum, body, and fundus with a cold forceps for                            histology. Estimated  blood loss was minimal.                           The examined duodenum was normal. Biopsies were                            taken with a cold forceps for histology. Estimated                            blood loss was minimal.                           The cardia and gastric fundus were normal on                            retroflexion.                           The exam was otherwise without abnormality. Complications:            No immediate complications. Estimated Blood Loss:     Estimated blood loss was minimal. Impression:               - Congested mucosa in the esophagus. Biopsied.                           - Erythematous, granular and decreased vascular                            pattern mucosa in the gastric body and antrum.                            Biopsied.                           - Normal examined duodenum. Biopsied.                           - No obvious source of iron deficiency anemia                            identified. Await biopsies  results. Recommendation:           - Patient has a contact number available for                            emergencies. The signs and symptoms of potential                            delayed complications were discussed with the                            patient. Return to normal activities tomorrow.                            Written discharge instructions were provided to the  patient.                           - Resume previous diet.                           - Continue present medications including oral iron                            supplements.                           - Await pathology results.                           - She has plans to have her iron/ferritin levels                            repeated 10/2022. Thornton Park MD, MD 06/19/2022 10:44:29 AM This report has been signed electronically.

## 2022-06-19 NOTE — Progress Notes (Signed)
A and O x3. Report to RN. Tolerated MAC anesthesia well.Teeth unchanged after procedure. 

## 2022-06-19 NOTE — Progress Notes (Signed)
Pt's states no medical or surgical changes since previsit or office visit. 

## 2022-06-19 NOTE — Op Note (Addendum)
[facility] Patient Name: Caroline Peterson Procedure Date: 06/19/2022 9:53 AM MRN: [id number] Endoscopist: Thornton Park MD, MD, [id number] Age: 46 Referring MD:  Date of Birth: 04/01/1976 Gender: Female Account #: [id number] Procedure:                Colonoscopy Indications:              Screening for colorectal malignant neoplasm, This                            is the patient's first colonoscopy Medicines:                Monitored Anesthesia Care Procedure:                Pre-Anesthesia Assessment:                           - Prior to the procedure, a History and Physical                            was performed, and patient medications and                            allergies were reviewed. The patient's tolerance of                            previous anesthesia was also reviewed. The risks                            and benefits of the procedure and the sedation                            options and risks were discussed with the patient.                            All questions were answered, and informed consent                            was obtained. Prior Anticoagulants: The patient has                            taken no anticoagulant or antiplatelet agents. ASA                            Grade Assessment: II - A patient with mild systemic                            disease. After reviewing the risks and benefits,                            the patient was deemed in satisfactory condition to                            undergo the procedure.  After obtaining informed consent, the colonoscope                            was passed under direct vision. Throughout the                            procedure, the patient's blood pressure, pulse, and                            oxygen saturations were monitored continuously. The                            CF [id number] #[id number] was introduced through the anus                            and advanced  to the 3 cm into the ileum. A second                            forward view of the right colon was performed. The                            colonoscopy was performed without difficulty. The                            patient tolerated the procedure well. The quality                            of the bowel preparation was good. The terminal                            ileum, ileocecal valve, appendiceal orifice, and                            rectum were photographed. Scope In: 10:25:01 AM Scope Out: 10:37:14 AM Scope Withdrawal Time: 0 hours 8 minutes 51 seconds  Total Procedure Duration: 0 hours 12 minutes 13 seconds  Findings:                 The perianal and digital rectal examinations were                            normal except for small internal and external                            hemorrhoids. Skin tags are present.                           A 3 mm polyp was found in the descending colon. The                            polyp was flat. The polyp was removed with a cold  snare. Resection and retrieval were complete.                            Estimated blood loss was minimal.                           The exam was otherwise without abnormality on                            direct and retroflexion views. Complications:            No immediate complications. Estimated Blood Loss:     Estimated blood loss was minimal. Impression:               - One 3 mm polyp in the descending colon, removed                            with a cold snare. Resected and retrieved.                           - The examination was otherwise normal on direct                            and retroflexion views. Recommendation:           - Patient has a contact number available for                            emergencies. The signs and symptoms of potential                            delayed complications were discussed with the                            patient. Return to normal  activities tomorrow.                            Written discharge instructions were provided to the                            patient.                           - Resume previous diet.                           - Continue present medications.                           - Await pathology results.                           - Repeat colonoscopy date to be determined after                            pending pathology results are reviewed for  surveillance.                           - Consider surgical referral if hemorrhoids/skin                            tags are symptomatic.                           - Emerging evidence supports eating a diet of                            fruits, vegetables, grains, calcium, and yogurt                            while reducing red meat and alcohol may reduce the                            risk of colon cancer.                           - Thank you for allowing me to be involved in your                            colon cancer prevention. Thornton Park MD, MD 06/19/2022 10:48:12 AM This report has been signed electronically.

## 2022-06-19 NOTE — Patient Instructions (Addendum)
Handout on polyps & hemorrhoids  given to you today  Await pathology results on polyps removed from colon  Await results on biopsies done duodenum ,gastric, & esophagus   YOU HAD AN ENDOSCOPIC PROCEDURE TODAY AT [place]:   Refer to the procedure report that was given to you for any specific questions about what was found during the examination.  If the procedure report does not answer your questions, please call your gastroenterologist to clarify.  If you requested that your care partner not be given the details of your procedure findings, then the procedure report has been included in a sealed envelope for you to review at your convenience later.  YOU SHOULD EXPECT: Some feelings of bloating in the abdomen. Passage of more gas than usual.  Walking can help get rid of the air that was put into your GI tract during the procedure and reduce the bloating. If you had a lower endoscopy (such as a colonoscopy or flexible sigmoidoscopy) you may notice spotting of blood in your stool or on the toilet paper. If you underwent a bowel prep for your procedure, you may not have a normal bowel movement for a few days.  Please Note:  You might notice some irritation and congestion in your nose or some drainage.  This is from the oxygen used during your procedure.  There is no need for concern and it should clear up in a day or so.  SYMPTOMS TO REPORT IMMEDIATELY:  Following lower endoscopy (colonoscopy or flexible sigmoidoscopy):  Excessive amounts of blood in the stool  Significant tenderness or worsening of abdominal pains  Swelling of the abdomen that is new, acute  Fever of 100F or higher  Following upper endoscopy (EGD)  Vomiting of blood or coffee ground material  New chest pain or pain under the shoulder blades  Painful or persistently difficult swallowing  New shortness of breath  Fever of 100F or higher  Black, tarry-looking stools  For urgent or emergent issues, a  gastroenterologist can be reached at any hour by calling [phone]. Do not use MyChart messaging for urgent concerns.    DIET:  We do recommend a small meal at first, but then you may proceed to your regular diet.  Drink plenty of fluids but you should avoid alcoholic beverages for 24 hours.  ACTIVITY:  You should plan to take it easy for the rest of today and you should NOT DRIVE or use heavy machinery until tomorrow (because of the sedation medicines used during the test).    FOLLOW UP: Our staff will call the number listed on your records the next business day following your procedure.  We will call around 7:15- 8:00 am to check on you and address any questions or concerns that you may have regarding the information given to you following your procedure. If we do not reach you, we will leave a message.     If any biopsies were taken you will be contacted by phone or by letter within the next 1-3 weeks.  Please call us at [phone] if you have not heard about the biopsies in 3 weeks.    SIGNATURES/CONFIDENTIALITY: You and/or your care partner have signed paperwork which will be entered into your electronic medical record.  These signatures attest to the fact that that the information above on your After Visit Summary has been reviewed and is understood.  Full responsibility of the confidentiality of this discharge information lies with you and/or your  care-partner.

## 2022-06-19 NOTE — Progress Notes (Signed)
Called to room to assist during endoscopic procedure.  Patient ID and intended procedure confirmed with present staff. Received instructions for my participation in the procedure from the performing physician.  

## 2022-06-19 NOTE — Progress Notes (Signed)
Referring Provider: Vivi Barrack, MD Primary Care Physician:  Vivi Barrack, MD   Indication for EGD: Iron deficiency anemia Indication for Colonoscopy:  Colon cancer screening   IMPRESSION:  Iron deficiency anemia Need for colon cancer screening Appropriate candidate for monitored anesthesia care  PLAN: EGD and colonoscopy in the [facility] today   HPI: Caroline Peterson is a 46 y.o. female presents for screening colonoscopy and upper endoscopy to evaluate for unexplained iron deficiency anemia.  Labs from 03/29/2022 show an iron of 47, ferritin 3.8, hemoglobin 11, MCV 84.4, RDW 14.5, platelets 218  No known family history of colon cancer or polyps. No family history of uterine/endometrial cancer, pancreatic cancer or gastric/stomach cancer.   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 90 tablet 3   busPIRone (BUSPAR) 10 MG tablet Take '10mg'$  in the morning and '20mg'$  at night. 90 tablet 11   Drospirenone (SLYND) 4 MG TABS Take by mouth.     ferrous gluconate (FERGON) 324 MG tablet Take 324 mg by mouth daily with breakfast.     hydrocortisone (ANUSOL-HC) 2.5 % rectal cream Place 1 Application rectally 3 (three) times daily. 30 g 1   metoprolol  succinate (TOPROL-XL) 50 MG 24 hr tablet TAKE 1 TABLET BY MOUTH TWICE DAILY WITH OR IMMEDIATELY FOLLOWING A MEAL 180 tablet 2   traZODone (DESYREL) 50 MG tablet Take 0.5-1 tablets (25-50 mg total) by mouth at bedtime as needed for sleep. 30 tablet 3   valACYclovir (VALTREX) 500 MG tablet Take 1 tablet (500 mg total) by mouth daily. 90 tablet 3   azelastine (ASTELIN) 0.1 % nasal spray Place 2 sprays into both nostrils 2 (two) times daily. 30 mL 12   LORazepam (ATIVAN) 0.5 MG tablet TAKE 1/2 TABLET(0.25 MG) BY MOUTH DAILY AS NEEDED FOR ANXIETY 30 tablet 5   Multiple Vitamin (MULTIVITAMIN) tablet Take 1 tablet by mouth daily.     Current Facility-Administered Medications  Medication Dose Route Frequency Provider Last Rate Last Admin   0.9 %  sodium chloride infusion  500 mL Intravenous Once Thornton Park, MD        Allergies as of 06/19/2022 - Review Complete 06/19/2022  Allergen Reaction Noted   Aspirin Nausea And Vomiting    Augmentin [amoxicillin-pot clavulanate]  10/13/2021   Erythromycin Nausea And Vomiting    Levaquin [levofloxacin] Other (See Comments) 08/02/2021   Nitrofurantoin Other (See Comments)    Azithromycin Nausea And Vomiting, Palpitations, and Other (See Comments) 06/19/2022    Family History  Problem Relation Age of Onset   Hypertension Mother    Hyperlipidemia Mother    Diabetes Father    Hyperlipidemia Father    Hypertension  Father    Kidney disease Father    Hypertension Brother    Cancer Maternal Grandmother    Cancer Maternal Grandfather    Cancer Paternal Grandmother    Heart disease Paternal Grandmother    Heart disease Paternal Grandfather    Hypertension Paternal Grandfather    Colon cancer Neg Hx    Esophageal cancer Neg Hx    Liver cancer Neg Hx    Pancreatic cancer Neg Hx    Rectal cancer Neg Hx    Stomach cancer Neg Hx      Physical Exam: General:   Alert,  well-nourished, pleasant and cooperative in NAD Head:  Normocephalic and  atraumatic. Eyes:  Sclera clear, no icterus.   Conjunctiva pink. Mouth:  No deformity or lesions.   Neck:  Supple; no masses or thyromegaly. Lungs:  Clear throughout to auscultation.   No wheezes. Heart:  Regular rate and rhythm; no murmurs. Abdomen:  Soft, non-tender, nondistended, normal bowel sounds, no rebound or guarding.  Msk:  Symmetrical. No boney deformities LAD: No inguinal or umbilical LAD Extremities:  No clubbing or edema. Neurologic:  Alert and  oriented x4;  grossly nonfocal Skin:  No obvious rash or bruise. Psych:  Alert and cooperative. Normal mood and affect.     Studies/Results: No results found.    Katielynn Horan L. Tarri Glenn, MD, MPH 06/19/2022, 10:03 AM

## 2022-06-20 ENCOUNTER — Telehealth: Payer: Self-pay | Admitting: *Deleted

## 2022-06-20 NOTE — Telephone Encounter (Signed)
  Follow up Call-     06/19/2022    9:41 AM  Call back number  Post procedure Call Back phone  # [phone]  Permission to leave phone message Yes     Patient questions:  Do you have a fever, pain , or abdominal swelling? No. Pain Score  0 *  Have you tolerated food without any problems? Yes.    Have you been able to return to your normal activities? Yes.    Do you have any questions about your discharge instructions: Diet   No. Medications  No. Follow up visit  No.  Do you have questions or concerns about your Care? No.  Actions: * If pain score is 4 or above: No action needed, pain <4.

## 2022-07-05 ENCOUNTER — Other Ambulatory Visit: Payer: Self-pay | Admitting: Family Medicine

## 2022-07-13 DIAGNOSIS — N939 Abnormal uterine and vaginal bleeding, unspecified: Secondary | ICD-10-CM | POA: Diagnosis not present

## 2022-07-25 ENCOUNTER — Other Ambulatory Visit: Payer: Self-pay | Admitting: Gastroenterology

## 2022-07-25 ENCOUNTER — Other Ambulatory Visit: Payer: Self-pay

## 2022-07-25 MED ORDER — PANTOPRAZOLE SODIUM 40 MG PO TBEC: 40.0000 mg | DELAYED_RELEASE_TABLET | Freq: Every morning | ORAL | 1 refills | Status: DC

## 2022-08-03 ENCOUNTER — Encounter: Payer: Self-pay | Admitting: Family Medicine

## 2022-08-07 NOTE — Telephone Encounter (Signed)
Ok for her to stop the atorvastatin '40mg'$  daily and start simvastatin '40mg'$  daily. Please send in new rx. This should have have the effects on her muscles like the atorvastatin did.  Algis Greenhouse. Jerline Pain, MD 08/07/2022 4:45 PM

## 2022-08-08 ENCOUNTER — Other Ambulatory Visit: Payer: Self-pay

## 2022-08-08 ENCOUNTER — Other Ambulatory Visit: Payer: Self-pay | Admitting: Family Medicine

## 2022-08-08 MED ORDER — SIMVASTATIN 40 MG PO TABS: 40.0000 mg | ORAL_TABLET | Freq: Every day | ORAL | 0 refills | Status: DC

## 2022-08-14 DIAGNOSIS — J342 Deviated nasal septum: Secondary | ICD-10-CM | POA: Diagnosis not present

## 2022-08-14 DIAGNOSIS — J343 Hypertrophy of nasal turbinates: Secondary | ICD-10-CM | POA: Diagnosis not present

## 2022-08-14 DIAGNOSIS — J3489 Other specified disorders of nose and nasal sinuses: Secondary | ICD-10-CM | POA: Diagnosis not present

## 2022-08-14 DIAGNOSIS — J338 Other polyp of sinus: Secondary | ICD-10-CM | POA: Diagnosis not present

## 2022-08-14 DIAGNOSIS — R0981 Nasal congestion: Secondary | ICD-10-CM | POA: Diagnosis not present

## 2022-08-14 HISTORY — PX: NASAL/SINUS ENDOSCOPY: SHX288

## 2022-08-17 DIAGNOSIS — Z48813 Encounter for surgical aftercare following surgery on the respiratory system: Secondary | ICD-10-CM | POA: Diagnosis not present

## 2022-08-17 DIAGNOSIS — Z9889 Other specified postprocedural states: Secondary | ICD-10-CM | POA: Diagnosis not present

## 2022-08-17 DIAGNOSIS — J341 Cyst and mucocele of nose and nasal sinus: Secondary | ICD-10-CM | POA: Diagnosis not present

## 2022-09-06 ENCOUNTER — Other Ambulatory Visit: Payer: Self-pay | Admitting: Family Medicine

## 2022-09-17 ENCOUNTER — Emergency Department (HOSPITAL_BASED_OUTPATIENT_CLINIC_OR_DEPARTMENT_OTHER): Payer: BC Managed Care – PPO

## 2022-09-17 ENCOUNTER — Emergency Department (HOSPITAL_COMMUNITY): Payer: BC Managed Care – PPO

## 2022-09-17 ENCOUNTER — Encounter (HOSPITAL_BASED_OUTPATIENT_CLINIC_OR_DEPARTMENT_OTHER): Payer: Self-pay

## 2022-09-17 ENCOUNTER — Emergency Department (HOSPITAL_BASED_OUTPATIENT_CLINIC_OR_DEPARTMENT_OTHER)
Admission: EM | Admit: 2022-09-17 | Discharge: 2022-09-17 | Disposition: A | Discharge status: Home / Self Care | Payer: BC Managed Care – PPO | Attending: Emergency Medicine | Admitting: Emergency Medicine

## 2022-09-17 DIAGNOSIS — R519 Headache, unspecified: Secondary | ICD-10-CM | POA: Insufficient documentation

## 2022-09-17 DIAGNOSIS — R531 Weakness: Secondary | ICD-10-CM | POA: Diagnosis not present

## 2022-09-17 DIAGNOSIS — R079 Chest pain, unspecified: Secondary | ICD-10-CM | POA: Diagnosis not present

## 2022-09-17 DIAGNOSIS — W01198A Fall on same level from slipping, tripping and stumbling with subsequent striking against other object, initial encounter: Secondary | ICD-10-CM | POA: Insufficient documentation

## 2022-09-17 DIAGNOSIS — R29898 Other symptoms and signs involving the musculoskeletal system: Secondary | ICD-10-CM | POA: Diagnosis not present

## 2022-09-17 DIAGNOSIS — M47812 Spondylosis without myelopathy or radiculopathy, cervical region: Secondary | ICD-10-CM | POA: Diagnosis not present

## 2022-09-17 DIAGNOSIS — I10 Essential (primary) hypertension: Secondary | ICD-10-CM | POA: Diagnosis not present

## 2022-09-17 DIAGNOSIS — Y9301 Activity, walking, marching and hiking: Secondary | ICD-10-CM | POA: Insufficient documentation

## 2022-09-17 DIAGNOSIS — R072 Precordial pain: Secondary | ICD-10-CM | POA: Diagnosis not present

## 2022-09-17 DIAGNOSIS — R0789 Other chest pain: Secondary | ICD-10-CM | POA: Diagnosis not present

## 2022-09-17 LAB — BASIC METABOLIC PANEL
Anion gap: 6 (ref 5–15)
BUN: 14 mg/dL (ref 6–20)
CO2: 25 mmol/L (ref 22–32)
Calcium: 7.9 mg/dL — ABNORMAL LOW (ref 8.9–10.3)
Chloride: 107 mmol/L (ref 98–111)
Creatinine, Ser: 0.65 mg/dL (ref 0.44–1.00)
GFR, Estimated: >60 mL/min (ref >60)
Glucose, Bld: 99 mg/dL (ref 70–99)
Potassium: 3.7 mmol/L (ref 3.5–5.1)
Sodium: 138 mmol/L (ref 135–145)

## 2022-09-17 LAB — CK: Total CK: 69 U/L (ref 38–234)

## 2022-09-17 LAB — CBC
HCT: 36.1 % (ref 36.0–46.0)
Hemoglobin: 11.8 g/dL — ABNORMAL LOW (ref 12.0–15.0)
MCH: 29.9 pg (ref 26.0–34.0)
MCHC: 32.7 g/dL (ref 30.0–36.0)
MCV: 91.4 fL (ref 80.0–100.0)
Platelets: 241 10*3/uL (ref 150–400)
RBC: 3.95 MIL/uL (ref 3.87–5.11)
RDW: 13.5 % (ref 11.5–15.5)
WBC: 6.1 10*3/uL (ref 4.0–10.5)
nRBC: 0 % (ref 0.0–0.2)

## 2022-09-17 LAB — MAGNESIUM: Magnesium: 1.7 mg/dL (ref 1.7–2.4)

## 2022-09-17 LAB — TSH: TSH: 2.215 u[IU]/mL (ref 0.350–4.500)

## 2022-09-17 LAB — TROPONIN I (HIGH SENSITIVITY): Troponin I (High Sensitivity): 2 ng/L (ref <18)

## 2022-09-17 LAB — HCG, SERUM, QUALITATIVE: Preg, Serum: NEGATIVE

## 2022-09-17 MED ORDER — DIAZEPAM 5 MG PO TABS
5.0000 mg | ORAL_TABLET | Freq: Once | ORAL | Status: AC | PRN
  Administered 2022-09-17: 5 mg via ORAL
  Filled 2022-09-17: qty 1

## 2022-09-17 MED ORDER — SODIUM CHLORIDE 0.9 % IV BOLUS: 500.0000 mL | Freq: Once | INTRAVENOUS | Status: DC

## 2022-09-17 NOTE — ED Provider Notes (Signed)
Patient received as transfer from [facility] EDP Dr. Laverta Baltimore who sent patient for MRI rule out. Please see their note for further information.  HPI:  Caroline Peterson is a 47 y.o. female with HTN presents emergency department for evaluation after fall this morning.  She awoke feeling well but and walking to the car noticed some weakness in the left arm causing her to drop her coffee.  She then felt like her left leg "gave out" and she fell to the ground.  She did strike her head and has a mild posterior headache without bleeding.  No new neck pain.  No fevers or chills.  No numbness.  She has had intermittent, more generalized weakness for the past couple of months.  She mention this to her PCP who changed her Lipitor to the Zocor thinking this could be contributing but she has not noticed much difference.  No unilateral weakness previously.  No prior history of stroke.  She is not anticoagulated.   After falling, she did notice some tightness in the center of her chest which is nonradiating.  No shortness of breath or pleuritic pain.    Plan: laboratory evaluation and CT imaging unremarkable. Plan for MRI to rule out acute findings, if normal d/c with close neurology follow-up.  MRI brain and cervical spine resulted and reveals:   No acute findings. I have personally reviewed and interpreted this imaging and agree with radiology interpretation.  Upon reassessment of patient, she states she is no longer symptomatic.  She is afebrile, nontoxic-appearing, and in no acute distress with reassuring vital signs.  She is also alert and oriented and neurologically intact without focal deficits.  She has 5 out of 5 strength and sensation intact to bilateral upper and lower extremities.  She is also able to ambulate with a steady gait.  Patient is stable for discharge per previous providers plan.  She is understanding and amenable with plan, educated on red flag symptoms that would prompt  immediate return.  Patient discharged in stable condition.   Nestor Lewandowsky 09/17/22 1410    Godfrey Pick, MD 09/19/22 1600

## 2022-09-17 NOTE — Discharge Instructions (Addendum)
You work-up in the ER today was reassuring for acute findings. I recommend you follow-up with neurology outpatient for continued evaluation and management of your symptoms.  Return if development of any new or worsening symptoms.

## 2022-09-17 NOTE — ED Notes (Signed)
Patient transported to MRI 

## 2022-09-17 NOTE — ED Notes (Signed)
Spoke to [place] , [place] at [facility] ED , made aware pt arrival via POV for MRI testing . Dr. Tamera Punt accepting

## 2022-09-17 NOTE — ED Provider Notes (Signed)
Emergency Department Provider Note   I have reviewed the triage vital signs and the nursing notes.   HISTORY  Chief Complaint Chest Pain and Weakness   HPI Caroline Peterson is a 47 y.o. female with HTN presents emergency department for evaluation after fall this morning.  She awoke feeling well but and walking to the car noticed some weakness in the left arm causing her to drop her coffee.  She then felt like her left leg "gave out" and she fell to the ground.  She did strike her head and has a mild posterior headache without bleeding.  No new neck pain.  No fevers or chills.  No numbness.  She has had intermittent, more generalized weakness for the past couple of months.  She mention this to her PCP who changed her Lipitor to the Zocor thinking this could be contributing but she has not noticed much difference.  No unilateral weakness previously.  No prior history of stroke.  She is not anticoagulated.  After falling, she did notice some tightness in the center of her chest which is nonradiating.  No shortness of breath or pleuritic pain.   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 Respiratory: Denies shortness of breath. Gastrointestinal: No abdominal pain.   Musculoskeletal: Negative for back pain. Skin: Negative for rash. Neurological: Positive HA. Left arm/leg weakness earlier.    ____________________________________________   PHYSICAL EXAM:  VITAL SIGNS: ED Triage Vitals  Enc Vitals Group     BP 09/17/22 0819 (!) 144/73     Pulse Rate 09/17/22 0819 67     Resp 09/17/22 0819 18     Temp 09/17/22 0819 98.1 F (36.7 C)     Temp Source 09/17/22 0819 Oral     SpO2 09/17/22 0819 99 %     Weight 09/17/22 0818 142 lb (64.4 kg)      Height 09/17/22 0818 '5\' 4"'$  (1.626 m)   Constitutional: Alert and oriented. Well appearing and in no acute distress. Eyes: Conjunctivae are normal.  Head: Atraumatic. Nose: No congestion/rhinnorhea. Mouth/Throat: Mucous membranes are moist.  Neck: No stridor.  No cervical spine tenderness to palpation. Cardiovascular: Normal rate, regular rhythm. Good peripheral circulation. Grossly normal heart sounds.   Respiratory: Normal respiratory effort.  No retractions. Lungs CTAB. Gastrointestinal: Soft and nontender. No distention.  Musculoskeletal: No lower extremity tenderness nor edema. No gross deformities of extremities. Neurologic:  Normal speech and language.  No facial asymmetry.  5/5 strength in the bilateral upper and lower extremities.  No pronator drift of the arms or legs.  Normal sensation throughout. Skin:  Skin is warm, dry and intact. No rash noted.  ____________________________________________   LABS (all labs ordered are listed, but only abnormal results are displayed)  Labs Reviewed  BASIC METABOLIC PANEL - Abnormal; Notable for the following components:      Result Value   Calcium 7.9 (*)    All other components within normal limits  CBC - Abnormal; Notable for the following components:   Hemoglobin 11.8 (*)    All other components within normal limits  HCG, SERUM, QUALITATIVE  CK  MAGNESIUM  TSH  TROPONIN I (HIGH SENSITIVITY)  TROPONIN I (HIGH SENSITIVITY)   ____________________________________________  EKG   EKG Interpretation  Date/Time:  Monday September 17 2022 08:18:39 EST Ventricular Rate:  66 PR Interval:  131 QRS Duration: 101 QT Interval:  433 QTC Calculation: 235 R Axis:   85 Text Interpretation: Sinus rhythm Confirmed by Nanda Quinton [phone]) on 09/17/2022 8:22:15 AM        ____________________________________________  RADIOLOGY  CT Head Wo Contrast  Result Date: 09/17/2022 CLINICAL DATA:  Left arm and leg weakness, neck trauma EXAM: CT  HEAD WITHOUT CONTRAST CT CERVICAL SPINE WITHOUT CONTRAST TECHNIQUE: Multidetector CT imaging of the head and cervical spine was performed following the standard protocol without intravenous contrast. Multiplanar CT image reconstructions of the cervical spine were also generated. RADIATION DOSE REDUCTION: This exam was performed according to the departmental dose-optimization program which includes automated exposure control, adjustment of the mA and/or kV according to patient size and/or use of iterative reconstruction technique. COMPARISON:  None Available. FINDINGS: CT HEAD FINDINGS Brain: No evidence of acute infarction, hemorrhage, hydrocephalus, extra-axial collection or mass lesion/mass effect. Generalized cerebral atrophy. Vascular: No hyperdense vessel or unexpected calcification. Skull: No osseous abnormality. Sinuses/Orbits: Visualized paranasal sinuses are clear. Visualized mastoid sinuses are clear. Visualized orbits demonstrate no focal abnormality. Other: No fluid collection or hematoma. CT CERVICAL SPINE FINDINGS Alignment: Normal. Skull base and vertebrae: No acute fracture. No primary bone lesion or focal pathologic process. Soft tissues and spinal canal: No prevertebral fluid or swelling. No visible canal hematoma. Disc levels: Degenerative disease with disc height loss and a broad-based disc osteophyte complex at C5-6. Right uncovertebral degenerative changes at C5-6 with mild right foraminal stenosis. Upper chest: Lung apices are clear. Other: No fluid collection or hematoma. IMPRESSION: 1. No acute intracranial pathology. 2. No acute osseous injury of the cervical spine. 3. Cervical spine spondylosis as described above. Electronically Signed   By: Kathreen Devoid M.D.   On: 09/17/2022 09:13   CT Cervical Spine Wo Contrast  Result Date: 09/17/2022 CLINICAL DATA:  Left arm and leg weakness, neck trauma EXAM: CT HEAD WITHOUT CONTRAST CT CERVICAL SPINE WITHOUT CONTRAST TECHNIQUE: Multidetector CT  imaging of the head and cervical spine was performed following the standard protocol without intravenous contrast. Multiplanar CT image reconstructions of the cervical spine were also generated. RADIATION DOSE REDUCTION: This exam was performed according to the departmental dose-optimization program which includes automated exposure control, adjustment of the mA and/or kV according to patient size and/or use of iterative reconstruction technique. COMPARISON:  None Available. FINDINGS: CT HEAD FINDINGS Brain: No evidence of acute infarction, hemorrhage, hydrocephalus, extra-axial collection or mass lesion/mass effect. Generalized cerebral atrophy. Vascular: No hyperdense vessel or unexpected calcification. Skull: No osseous abnormality. Sinuses/Orbits: Visualized paranasal sinuses are clear. Visualized mastoid sinuses are clear. Visualized orbits demonstrate no focal abnormality. Other: No fluid collection or hematoma. CT CERVICAL SPINE FINDINGS Alignment: Normal. Skull base and vertebrae: No acute fracture. No primary bone lesion or focal pathologic process. Soft tissues and spinal canal: No prevertebral fluid or swelling. No visible canal hematoma. Disc levels: Degenerative disease with disc height loss and a broad-based disc osteophyte complex at C5-6. Right uncovertebral degenerative changes at C5-6 with mild right foraminal stenosis. Upper chest: Lung apices are clear. Other: No fluid collection or hematoma. IMPRESSION: 1. No acute intracranial pathology. 2. No acute osseous injury of the cervical spine. 3. Cervical spine spondylosis as described above. Electronically Signed   By: Kathreen Devoid M.D.   On: 09/17/2022 09:13   DG Chest Port 1 View  Result Date: 09/17/2022 CLINICAL DATA:  Chest pain after fall. EXAM: PORTABLE  CHEST 1 VIEW COMPARISON:  Chest x-ray dated March 07, 2012. FINDINGS: The heart size and mediastinal contours are within normal limits. Both lungs are clear. No acute osseous abnormality.  Unchanged dextroscoliosis of the thoracic spine. IMPRESSION: 1. No active disease. Electronically Signed   By: Titus Dubin M.D.   On: 09/17/2022 08:37    ____________________________________________   PROCEDURES  Procedure(s) performed:   Procedures  None  ____________________________________________   INITIAL IMPRESSION / ASSESSMENT AND PLAN / ED COURSE  Pertinent labs & imaging results that were available during my care of the patient were reviewed by me and considered in my medical decision making (see chart for details).   This patient is Presenting for Evaluation of HA/weakness, which does require a range of treatment options, and is a complaint that involves a high risk of morbidity and mortality.  The Differential Diagnoses includes but is not exclusive to subarachnoid hemorrhage, meningitis, encephalitis, previous head trauma, cavernous venous thrombosis, muscle tension headache, glaucoma, temporal arteritis, migraine or migraine equivalent, etc.   Critical Interventions-    Medications  diazepam (VALIUM) tablet 5 mg (has no administration in time range)    Reassessment after intervention: No return of weakness.    I did obtain Additional Historical Information from Mom at bedside.   I decided to review pertinent External Data, and in summary patient's PCP in the [facility] system.    Clinical Laboratory Tests Ordered, included TSH pending.  CK and troponin normal.  Pregnancy negative.  Electrolytes including potassium and magnesium are normal.  No leukocytosis.  No acute kidney injury.  Radiologic Tests Ordered, included CXR, CT head, and c-spine. I independently interpreted the images and agree with radiology interpretation.   Cardiac Monitor Tracing which shows NSR.    Social Determinants of Health Risk patient is a non-smoker.   Medical Decision Making: Summary:  Patient presents emergency department with sudden left arm/leg weakness although currently  is completely neuro intact.  Describes some intermittent, more generalized weakness over the past couple of months.  Has not followed with neurology or discussed the specific findings with her PCP in detail.  I do not see exam evidence to activate a code stroke.  Plan for CT imaging of the head along with chest pain workup but seems posttraumatic and likely musculoskeletal.   Reevaluation with update and discussion with patient and mom at bedside.  We discussed the imaging results along with labs.  My preference would be to repeat noncontrast MRI of the brain and C-spine given new and now unilateral symptoms.  ABCD 2 score low.  I have placed a referral and given contact information in the AVS for close neurology follow-up as an outpatient, pending MRI scans today.  Have also placed an order for as needed Valium.  I am comfortable with her mom driving her to the ER by private vehicle.  Will call ahead to have patient excepted as a transfer.  Dr. Tamera Punt is the accepting EDP.   Considered admission but will transfer for MRI and reassess.   Patient's presentation is most consistent with acute presentation with potential threat to life or bodily function.   Disposition: transfer for MRI   ____________________________________________  FINAL CLINICAL IMPRESSION(S) / ED DIAGNOSES  Final diagnoses:  Generalized weakness  Precordial chest pain    Note:  This document was prepared using Dragon voice recognition software and may include unintentional dictation errors.  Nanda Quinton, MD, [facility] Emergency Medicine    Beva Remund, Wonda Olds, MD 09/17/22 [phone]

## 2022-09-17 NOTE — ED Triage Notes (Signed)
States was getting into car this morning at 0710 & started having weakness in left arm and leg and was putting coffee down on the ground and her leg gave out and she fell. Hit head on the ground, denies LOC. C/o chest pain after fall on left side with left arm pain and headache.   States has been having weakness intermittently since January.

## 2022-09-17 NOTE — ED Notes (Signed)
Pt verbalizes understanding of discharge instructions. Opportunity for questions and answers were provided. Pt discharged from the ED.   ?

## 2022-09-19 ENCOUNTER — Ambulatory Visit: Payer: BC Managed Care – PPO | Admitting: Gastroenterology

## 2022-10-04 ENCOUNTER — Encounter: Payer: Self-pay | Admitting: Neurology

## 2022-10-04 ENCOUNTER — Ambulatory Visit (INDEPENDENT_AMBULATORY_CARE_PROVIDER_SITE_OTHER): Payer: BC Managed Care – PPO | Admitting: Neurology

## 2022-10-04 VITALS — BP 149/89 | HR 70 | Ht 64.0 in | Wt 147.0 lb

## 2022-10-04 DIAGNOSIS — R531 Weakness: Secondary | ICD-10-CM | POA: Diagnosis not present

## 2022-10-04 NOTE — Progress Notes (Signed)
Chief Complaint  Patient presents with   New Patient (Initial Visit)    Rm 15. Patient is alone, patient reports weakness following surgery, right arm mostly. Left arm has had an episode, patient was seen in ER recently for fall because of weakness and things were normal. Mouth also twitching but just had nasal surgery. Weakness worse on right side and grip is bothersome as well.       ASSESSMENT AND PLAN  Annita Stanchfield is a 47 y.o. female    Muscle weakness following statin treatment, Evidence of cervical spondylosis,   with right C5-6 foraminal  Normal CPK,  Differentiation diagnosis include statin induced myopathy, with superimposed right cervical radiculopathy  EMG nerve conduction study  Laboratory evaluation for inflammatory markers   DIAGNOSTIC DATA (LABS, IMAGING, TESTING) - I reviewed patient records, labs, notes, testing and imaging myself where available.   MEDICAL HISTORY:  Aleka Mandes, is a 46 year old female seen in request by her primary care doctor Dimas Chyle for evaluation of weakness, initial evaluation was on October 04, 2020  I reviewed and summarized the referring note. PMHX. Anxiety Iron Deficiency anemia HTN HLD, could not tolerate statin,   Chronic insomnia.  She works as a Primary school teacher, was taken to the emergency room by her mother on September 17, 2022, that morning she was ready to work, having backpack on her right shoulder, holding her cell phone, lunch bag, coffee cup by her left hand as she usually does, then she felt left arm weakness to the point of dropping her coffee, she has to squatting down to put off her load, then fell backwards, landed on the hard floor,  She has been complains of generalized weakness since her statin treatment in September 2023, initially was given Lipitor up to 40 mg, by December 2023, she has developed such weakness to the point of difficulty raising arm overhead to  shampoo her hair, but denies significant muscle achy pain, Lipitor were stopped, her symptoms gradually improved  Then since August 28, 2022, she was given Zocor 40 mg, few weeks into that, she noticed recurrent bilateral upper extremity weakness, difficulty raising overhead but not to the previous degree  In addition, she complains of chronic neck pain, radiating pain to her right shoulder, right arm seems to be weaker than the left  Personally reviewed MRI of cervical spine September 17, 2022, degenerative spondylosis C5-6, with endplate osteophyte, towards the right, narrowing of the ventral subarachnoid space, but no compression of the cord, foraminal narrowing on the right, because of the encroachment that osteophyte, bulging disc material,  MRI of the brain, no significant abnormality  Laboratory evaluation in February 2024 showed normal magnesium CPK, CBC showed anemia 11.8, due to heavy menstrual bleeding, normal TSH, BMP showed low calcium 7.9  She has stopped the Zocor 40 mg since September 17, 2022, noticed some improvement of her weakness since  PHYSICAL EXAM:   Vitals:   10/04/22 1412 10/04/22 1413  BP: (!) 148/85 (!) 149/89  Pulse: 70   Weight: 147 lb (66.7 kg)   Height: 5' 4"$  (1.626 m)    Not recorded     Body mass index is 25.23 kg/m.  PHYSICAL EXAMNIATION:  Gen: NAD, conversant, well nourised, well groomed                     Cardiovascular: Regular rate rhythm, no peripheral edema, warm, nontender. Eyes: Conjunctivae clear without exudates or hemorrhage Neck: Supple,  no carotid bruits. Pulmonary: Clear to auscultation bilaterally   NEUROLOGICAL EXAM:  MENTAL STATUS: Speech/cognition: Awake, alert, oriented to history taking and casual conversation CRANIAL NERVES: CN II: Visual fields are full to confrontation. Pupils are round equal and briskly reactive to light. CN III, IV, VI: extraocular movement are normal. No ptosis. CN V: Facial sensation is intact  to light touch CN VII: Face is symmetric with normal eye closure  CN VIII: Hearing is normal to causal conversation. CN IX, X: Phonation is normal. CN XI: Head turning and shoulder shrug are intact  MOTOR: Mild bilateral shoulder abduction, elbow flexion, extension weakness, right worse than left, also mild bilateral hip flexion weakness,  REFLEXES: Reflexes are 1  and symmetric at the biceps, triceps, knees, and ankles. Plantar responses are flexor.  SENSORY: Intact to light touch, pinprick and vibratory sensation are intact in fingers and toes.  COORDINATION: There is no trunk or limb dysmetria noted.  GAIT/STANCE: Able to get up from sit down, squatting down position without assistant, steady gait  REVIEW OF SYSTEMS:  Full 14 system review of systems performed and notable only for as above All other review of systems were negative.   ALLERGIES: Allergies  Allergen Reactions   Aspirin Nausea And Vomiting    "can take coated aspirin"   Augmentin [Amoxicillin-Pot Clavulanate]     Diarrhea   Erythromycin Nausea And Vomiting   Levaquin [Levofloxacin] Other (See Comments)    Trigger thumb; arthralgias   Nitrofurantoin Other (See Comments)    Unknown    Azithromycin Nausea And Vomiting, Palpitations and Other (See Comments)    "made my heart speed up"    HOME MEDICATIONS: Current Outpatient Medications  Medication Sig Dispense Refill   azelastine (ASTELIN) 0.1 % nasal spray Place 2 sprays into both nostrils 2 (two) times daily. 30 mL 12   busPIRone (BUSPAR) 10 MG tablet Take 29m in the morning and 288mat night. 90 tablet 11   ferrous gluconate (FERGON) 324 MG tablet Take 324 mg by mouth daily with breakfast.     hydrocortisone (ANUSOL-HC) 2.5 % rectal cream Place 1 Application rectally 3 (three) times daily. 30 g 1   LORazepam (ATIVAN) 0.5 MG tablet TAKE 1/2 TABLET(0.25 MG) BY MOUTH DAILY AS NEEDED FOR ANXIETY 30 tablet 5   metoprolol succinate (TOPROL-XL) 50 MG 24 hr  tablet TAKE 1 TABLET BY MOUTH TWICE DAILY WITH OR IMMEDIATELY FOLLOWING A MEAL 180 tablet 2   Multiple Vitamin (MULTIVITAMIN) tablet Take 1 tablet by mouth daily.     traZODone (DESYREL) 50 MG tablet TAKE 1/2 TO 1 TABLET(25 TO 50 MG) BY MOUTH AT BEDTIME AS NEEDED FOR SLEEP 30 tablet 3   valACYclovir (VALTREX) 500 MG tablet Take 1 tablet (500 mg total) by mouth daily. 90 tablet 3   Drospirenone (SLYND) 4 MG TABS Take by mouth.     pantoprazole (PROTONIX) 40 MG tablet TAKE 1 TABLET(40 MG) BY MOUTH IN THE MORNING (Patient not taking: Reported on 10/04/2022) 90 tablet 1   simvastatin (ZOCOR) 40 MG tablet TAKE 1 TABLET(40 MG) BY MOUTH AT BEDTIME (Patient not taking: Reported on 10/04/2022) 30 tablet 0   No current facility-administered medications for this visit.    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     PAST SURGICAL HISTORY: Past Surgical History:  Procedure Laterality Date  BREAST CYST ASPIRATION Bilateral 01/2020   BREAST CYST ASPIRATION Right 2017   CESAREAN SECTION     COLONOSCOPY WITH ESOPHAGOGASTRODUODENOSCOPY (EGD)  06/19/2022   DILATION AND CURETTAGE OF UTERUS     EYE SURGERY Bilateral 2019   Laser peripheral iridotomy   EYE SURGERY Left 2018   Laser peripheral iridotomy    MOUTH SURGERY  1994   wisdom teeth    TONSILLECTOMY AND ADENOIDECTOMY      FAMILY HISTORY: Family History  Problem Relation Age of Onset   Hypertension Mother    Hyperlipidemia Mother    Diabetes Father    Hyperlipidemia Father    Hypertension Father    Kidney disease Father    Hypertension Brother    Cancer Maternal Grandmother    Cancer Maternal Grandfather    Cancer Paternal Grandmother    Heart disease Paternal Grandmother    Heart disease Paternal Grandfather    Hypertension Paternal Grandfather    Colon cancer Neg Hx     Esophageal cancer Neg Hx    Liver cancer Neg Hx    Pancreatic cancer Neg Hx    Rectal cancer Neg Hx    Stomach cancer Neg Hx     SOCIAL HISTORY: Social History   Socioeconomic History   Marital status: Married    Spouse name: Khali Starkovich   Number of children: 1   Years of education: Not on file   Highest education level: Not on file  Occupational History    Employer: [employer]  Tobacco Use   Smoking status: Never   Smokeless tobacco: Never  Vaping Use   Vaping Use: Never used  Substance and Sexual Activity   Alcohol use: No   Drug use: No   Sexual activity: Yes    Birth control/protection: Other-see comments    Comment: 06-19-22 states continuously bleeding  Other Topics Concern   Not on file  Social History Narrative   Lives with husband and daughter.    Social Determinants of Health   Financial Resource Strain: Not on file  Food Insecurity: Not on file  Transportation Needs: Not on file  Physical Activity: Not on file  Stress: Not on file  Social Connections: Not on file  Intimate Partner Violence: Not on file      Marcial Pacas, M.D. Ph.D.  [facility] [address] [place], [place] [address] Ph: [phone] Fax: [phone]  CC:  Long, Wonda Olds, MD [place],  [place] [id number]  Vivi Barrack, MD

## 2022-10-05 ENCOUNTER — Other Ambulatory Visit: Payer: Self-pay | Admitting: Gastroenterology

## 2022-10-07 ENCOUNTER — Telehealth: Payer: BC Managed Care – PPO

## 2022-10-11 ENCOUNTER — Telehealth: Payer: BC Managed Care – PPO | Admitting: Nurse Practitioner

## 2022-10-11 DIAGNOSIS — J019 Acute sinusitis, unspecified: Secondary | ICD-10-CM | POA: Diagnosis not present

## 2022-10-11 MED ORDER — DOXYCYCLINE HYCLATE 100 MG PO TABS: 100.0000 mg | ORAL_TABLET | Freq: Two times a day (BID) | ORAL | 0 refills | Status: DC

## 2022-10-11 MED ORDER — PROMETHAZINE-DM 6.25-15 MG/5ML PO SYRP: 5.0000 mL | ORAL_SOLUTION | Freq: Four times a day (QID) | ORAL | 0 refills | Status: DC | PRN

## 2022-10-11 NOTE — Progress Notes (Signed)
Virtual Visit Consent   Caroline Peterson, you are scheduled for a virtual visit with a [facility] provider today. Just as with appointments in the office, your consent must be obtained to participate. Your consent will be active for this visit and any virtual visit you may have with one of our providers in the next 365 days. If you have a MyChart account, a copy of this consent can be sent to you electronically.  As this is a virtual visit, video technology does not allow for your provider to perform a traditional examination. This may limit your provider's ability to fully assess your condition. If your provider identifies any concerns that need to be evaluated in person or the need to arrange testing (such as labs, EKG, etc.), we will make arrangements to do so. Although advances in technology are sophisticated, we cannot ensure that it will always work on either your end or our end. If the connection with a video visit is poor, the visit may have to be switched to a telephone visit. With either a video or telephone visit, we are not always able to ensure that we have a secure connection.  By engaging in this virtual visit, you consent to the provision of healthcare and authorize for your insurance to be billed (if applicable) for the services provided during this visit. Depending on your insurance coverage, you may receive a charge related to this service.  I need to obtain your verbal consent now. Are you willing to proceed with your visit today? Caroline Peterson has provided verbal consent on 10/11/2022 for a virtual visit (video or telephone). Caroline Men, NP  Date: 10/11/2022 6:21 PM  Virtual Visit via Video Note   I, Caroline Peterson, connected with  Caroline Peterson  ([id number], 1975-09-09) on 10/11/22 at  6:15 PM EST by a video-enabled telemedicine application and verified that I am speaking with the correct person using two  identifiers.  Location: Patient: Virtual Visit Location Patient: Home Provider: Virtual Visit Location Provider: Mobile   I discussed the limitations of evaluation and management by telemedicine and the availability of in person appointments. The patient expressed understanding and agreed to proceed.    History of Present Illness: Caroline Peterson is a 47 y.o. who identifies as a female who was assigned female at birth, and is being seen today for sneezing, nasal congestion, and cough. She has a sore throat and ear pain when her symptoms initially started. She denies fever, chills, ear drainage, wheezing, SOB  or difficulty breathing. She has been taking Astelin nasal spray and Allegra for her symptoms.   HPI: HPI  Problems:  Patient Active Problem List   Diagnosis Date Noted   Dysmenorrhea 05/31/2022   Intracranial atherosclerosis 05/09/2022   Iron deficiency 05/09/2022   Adjustment disorder 03/14/2022   Dyslipidemia 02/28/2021   Other fatigue 10/05/2020   Hearing loss 09/19/2020   Lipoma 06/10/2020   Allergic rhinitis 11/09/2019   Insomnia 11/09/2019   Cold sore 11/09/2019   Osteoarthritis 11/09/2019   Eye abnormality 11/09/2019   Panic attacks 04/09/2019   Palpitation 02/17/2013   Hypertension 11/22/2010    Allergies:  Allergies  Allergen Reactions   Aspirin Nausea And Vomiting    "can take coated aspirin"   Augmentin [Amoxicillin-Pot Clavulanate]     Diarrhea   Erythromycin Nausea And Vomiting   Levaquin [Levofloxacin] Other (See Comments)    Trigger thumb; arthralgias   Nitrofurantoin Other (See Comments)  Unknown    Azithromycin Nausea And Vomiting, Palpitations and Other (See Comments)    "made my heart speed up"   Medications:  Current Outpatient Medications:    doxycycline (VIBRA-TABS) 100 MG tablet, Take 1 tablet (100 mg total) by mouth 2 (two) times daily., Disp: 20 tablet, Rfl: 0   promethazine-dextromethorphan (PROMETHAZINE-DM) 6.25-15  MG/5ML syrup, Take 5 mLs by mouth 4 (four) times daily as needed for cough., Disp: 118 mL, Rfl: 0   azelastine (ASTELIN) 0.1 % nasal spray, Place 2 sprays into both nostrils 2 (two) times daily., Disp: 30 mL, Rfl: 12   busPIRone (BUSPAR) 10 MG tablet, Take '10mg'$  in the morning and '20mg'$  at night. (Patient taking differently: Take 10 mg by mouth 2 (two) times daily.), Disp: 90 tablet, Rfl: 11   ferrous gluconate (FERGON) 324 MG tablet, Take 324 mg by mouth daily with breakfast., Disp: , Rfl:    hydrocortisone (ANUSOL-HC) 2.5 % rectal cream, APPLY RECTALLY TO THE AFFECTED AREA THREE TIMES DAILY, Disp: 30 g, Rfl: 1   LORazepam (ATIVAN) 0.5 MG tablet, TAKE 1/2 TABLET(0.25 MG) BY MOUTH DAILY AS NEEDED FOR ANXIETY, Disp: 30 tablet, Rfl: 5   metoprolol succinate (TOPROL-XL) 50 MG 24 hr tablet, TAKE 1 TABLET BY MOUTH TWICE DAILY WITH OR IMMEDIATELY FOLLOWING A MEAL, Disp: 180 tablet, Rfl: 2   Multiple Vitamin (MULTIVITAMIN) tablet, Take 1 tablet by mouth daily., Disp: , Rfl:    pantoprazole (PROTONIX) 40 MG tablet, TAKE 1 TABLET(40 MG) BY MOUTH IN THE MORNING, Disp: 90 tablet, Rfl: 1   simvastatin (ZOCOR) 40 MG tablet, TAKE 1 TABLET(40 MG) BY MOUTH AT BEDTIME, Disp: 30 tablet, Rfl: 0   traZODone (DESYREL) 50 MG tablet, TAKE 1/2 TO 1 TABLET(25 TO 50 MG) BY MOUTH AT BEDTIME AS NEEDED FOR SLEEP, Disp: 30 tablet, Rfl: 3   valACYclovir (VALTREX) 500 MG tablet, Take 1 tablet (500 mg total) by mouth daily., Disp: 90 tablet, Rfl: 3  Observations/Objective: Patient is well-developed, well-nourished in no acute distress.  Resting comfortably at home.  Head is normocephalic, atraumatic.  No labored breathing.  Speech is clear and coherent with logical content.  Patient is alert and oriented at baseline.    Assessment and Plan: 1. Acute sinusitis, recurrence not specified, unspecified location - doxycycline (VIBRA-TABS) 100 MG tablet; Take 1 tablet (100 mg total) by mouth 2 (two) times daily.  Dispense: 20 tablet;  Refill: 0 - promethazine-dextromethorphan (PROMETHAZINE-DM) 6.25-15 MG/5ML syrup; Take 5 mLs by mouth 4 (four) times daily as needed for cough.  Dispense: 118 mL; Refill: 0  Symptoms have been present for 10 days, consistent with acute sinusitis. Doxycycline '100mg'$  prescribed for sinusitis, promethazine DM prescribed for cough. Patient will continue use of Astelin nasal spray at this time. Supportive care recommendations were provided, along with indications for follow-up. Patient verbalizes understanding, all questions were answered.   Follow Up Instructions: I discussed the assessment and treatment plan with the patient. The patient was provided an opportunity to ask questions and all were answered. The patient agreed with the plan and demonstrated an understanding of the instructions.  A copy of instructions were sent to the patient via MyChart unless otherwise noted below.   The patient was advised to call back or seek an in-person evaluation if the symptoms worsen or if the condition fails to improve as anticipated.  Time:  I spent 10 minutes with the patient via telehealth technology discussing the above problems/concerns.    Caroline Men, NP

## 2022-10-11 NOTE — Patient Instructions (Signed)
Caroline Peterson, thank you for joining [name], NP for today's virtual visit.  While this provider is not your primary care provider (PCP), if your PCP is located in our provider database this encounter information will be shared with them immediately following your visit.   [facility] account gives you access to today's visit and all your visits, tests, and labs performed at [facility] " click here if you don't have a [facility] account or go to [email or web address]  Consent: (Patient) Caroline Peterson provided verbal consent for this virtual visit at the beginning of the encounter.  Current Medications:  Current Outpatient Medications:    doxycycline (VIBRA-TABS) 100 MG tablet, Take 1 tablet (100 mg total) by mouth 2 (two) times daily., Disp: 20 tablet, Rfl: 0   promethazine-dextromethorphan (PROMETHAZINE-DM) 6.25-15 MG/5ML syrup, Take 5 mLs by mouth 4 (four) times daily as needed for cough., Disp: 118 mL, Rfl: 0   azelastine (ASTELIN) 0.1 % nasal spray, Place 2 sprays into both nostrils 2 (two) times daily., Disp: 30 mL, Rfl: 12   busPIRone (BUSPAR) 10 MG tablet, Take '10mg'$  in the morning and '20mg'$  at night. (Patient taking differently: Take 10 mg by mouth 2 (two) times daily.), Disp: 90 tablet, Rfl: 11   ferrous gluconate (FERGON) 324 MG tablet, Take 324 mg by mouth daily with breakfast., Disp: , Rfl:    hydrocortisone (ANUSOL-HC) 2.5 % rectal cream, APPLY RECTALLY TO THE AFFECTED AREA THREE TIMES DAILY, Disp: 30 g, Rfl: 1   LORazepam (ATIVAN) 0.5 MG tablet, TAKE 1/2 TABLET(0.25 MG) BY MOUTH DAILY AS NEEDED FOR ANXIETY, Disp: 30 tablet, Rfl: 5   metoprolol succinate (TOPROL-XL) 50 MG 24 hr tablet, TAKE 1 TABLET BY MOUTH TWICE DAILY WITH OR IMMEDIATELY FOLLOWING A MEAL, Disp: 180 tablet, Rfl: 2   Multiple Vitamin (MULTIVITAMIN) tablet, Take 1 tablet by mouth daily., Disp: , Rfl:    pantoprazole (PROTONIX) 40 MG  tablet, TAKE 1 TABLET(40 MG) BY MOUTH IN THE MORNING, Disp: 90 tablet, Rfl: 1   simvastatin (ZOCOR) 40 MG tablet, TAKE 1 TABLET(40 MG) BY MOUTH AT BEDTIME, Disp: 30 tablet, Rfl: 0   traZODone (DESYREL) 50 MG tablet, TAKE 1/2 TO 1 TABLET(25 TO 50 MG) BY MOUTH AT BEDTIME AS NEEDED FOR SLEEP, Disp: 30 tablet, Rfl: 3   valACYclovir (VALTREX) 500 MG tablet, Take 1 tablet (500 mg total) by mouth daily., Disp: 90 tablet, Rfl: 3   Medications ordered in this encounter:  Meds ordered this encounter  Medications   doxycycline (VIBRA-TABS) 100 MG tablet    Sig: Take 1 tablet (100 mg total) by mouth 2 (two) times daily.    Dispense:  20 tablet    Refill:  0   promethazine-dextromethorphan (PROMETHAZINE-DM) 6.25-15 MG/5ML syrup    Sig: Take 5 mLs by mouth 4 (four) times daily as needed for cough.    Dispense:  118 mL    Refill:  0     *If you need refills on other medications prior to your next appointment, please contact your pharmacy*  Follow-Up: Call back or seek an in-person evaluation if the symptoms worsen or if the condition fails to improve as anticipated.  [facility] [phone]  Other Instructions Take medication as directed. Continue your current allergy medication regimen. Increase fluids and get plenty of rest. May take over-the-counter ibuprofen or Tylenol as needed for pain, fever, or general discomfort. Recommend normal saline nasal spray to help with nasal  congestion throughout the day. For your cough, it may be helpful to use a humidifier at bedtime during sleep. If your symptoms fail to improve,please follow-up with your primary care physician.    If you have been instructed to have an in-person evaluation today at a local Urgent Care facility, please use the link below. It will take you to a list of all of our available [facility], including address, phone number and hours of operation. Please do not delay care.  [facility]  If  you or a family member do not have a primary care provider, use the link below to schedule a visit and establish care. When you choose a  primary care physician or advanced practice provider, you gain a long-term partner in health. Find a Primary Care Provider  Learn more about 's in-office and virtual care options: [facility]

## 2022-10-27 ENCOUNTER — Other Ambulatory Visit: Payer: Self-pay

## 2022-10-27 ENCOUNTER — Ambulatory Visit
Admission: EM | Admit: 2022-10-27 | Discharge: 2022-10-27 | Disposition: A | Discharge status: Home / Self Care | Payer: BC Managed Care – PPO | Attending: Emergency Medicine | Admitting: Emergency Medicine

## 2022-10-27 ENCOUNTER — Ambulatory Visit (INDEPENDENT_AMBULATORY_CARE_PROVIDER_SITE_OTHER): Payer: BC Managed Care – PPO

## 2022-10-27 ENCOUNTER — Encounter: Payer: Self-pay | Admitting: Emergency Medicine

## 2022-10-27 DIAGNOSIS — R059 Cough, unspecified: Secondary | ICD-10-CM

## 2022-10-27 DIAGNOSIS — R0602 Shortness of breath: Secondary | ICD-10-CM

## 2022-10-27 DIAGNOSIS — R051 Acute cough: Secondary | ICD-10-CM

## 2022-10-27 MED ORDER — BENZONATATE 100 MG PO CAPS: 100.0000 mg | ORAL_CAPSULE | Freq: Three times a day (TID) | ORAL | 0 refills | Status: DC

## 2022-10-27 MED ORDER — DEXAMETHASONE SODIUM PHOSPHATE 10 MG/ML IJ SOLN
10.0000 mg | Freq: Once | INTRAMUSCULAR | Status: AC
  Administered 2022-10-27: 10 mg via INTRAMUSCULAR

## 2022-10-27 MED ORDER — PROMETHAZINE-DM 6.25-15 MG/5ML PO SYRP: 5.0000 mL | ORAL_SOLUTION | Freq: Four times a day (QID) | ORAL | 0 refills | Status: DC | PRN

## 2022-10-27 MED ORDER — METHYLPREDNISOLONE SODIUM SUCC 125 MG IJ SOLR: 60.0000 mg | Freq: Once | INTRAMUSCULAR | Status: DC

## 2022-10-27 NOTE — Discharge Instructions (Addendum)
Get plenty of rest and push fluid Decadron IM was given in office Promethazine DM was prescribed for cough/take as prescribed.  This medication can make you sleepy Continue to take Tessalon Perles 100 mg every 4 hours.  No more than 6 tablets in 24 hours Continue to use albuterol 2 puff every 6 hours Use medications daily for symptom relief Use OTC medications like ibuprofen or tylenol as needed fever or pain Follow up with PCP in 1-2 days via phone or e-visit for recheck and to ensure symptoms are improving Call or go to the ED if you have any new or worsening symptoms such as fever, worsening cough, shortness of breath, chest tightness, chest pain, turning blue, changes in mental status, etc..Marland Kitchen

## 2022-10-27 NOTE — ED Triage Notes (Signed)
Pt here for cough and congestion with body aches x 5 days 

## 2022-10-27 NOTE — ED Provider Notes (Signed)
Caroline Peterson   [id number] 10/27/22 Arrival Time: 0800   Chief Complaint  Patient presents with   Cough     SUBJECTIVE: History from: patient.  Caroline Peterson is a 47 y.o. female who presents to the urgent care with a complaint of worsening cough  for the past 5 days.  Reports cough has worsening and was unable to sleep.  Denies known sick exposure to COVID, flu or strep.  Reported on February 29 she was treated with doxycycline for sinusitis.  She stated at that time she has a mild cough.  Denies recent travel.   Denies any aggravating factors.  Denies previous symptoms in the past.  Denies fever, chills, fatigue, sinus pain, rhinorrhea, sore throat, SOB, wheezing, chest pain, nausea, changes in bowel or bladder habits.     ROS: As per HPI.  All other pertinent ROS negative.      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    Past Surgical History:  Procedure Laterality Date   BREAST CYST ASPIRATION Bilateral 01/2020   BREAST CYST ASPIRATION Right 2017   CESAREAN SECTION     COLONOSCOPY WITH ESOPHAGOGASTRODUODENOSCOPY (EGD)  06/19/2022   DILATION AND CURETTAGE OF UTERUS     EYE SURGERY Bilateral 2019   Laser peripheral iridotomy   EYE SURGERY Left 2018   Laser peripheral iridotomy    MOUTH SURGERY  1994   wisdom teeth    TONSILLECTOMY AND ADENOIDECTOMY     Allergies  Allergen Reactions   Aspirin Nausea And Vomiting    "can take coated aspirin"   Augmentin [Amoxicillin-Pot Clavulanate]     Diarrhea   Erythromycin Nausea And Vomiting   Levaquin [Levofloxacin] Other (See Comments)    Trigger thumb; arthralgias   Nitrofurantoin Other (See Comments)    Unknown    Azithromycin Nausea And Vomiting, Palpitations and Other (See Comments)    "made my heart speed up"   No current facility-administered medications on  file prior to encounter.   Current Outpatient Medications on File Prior to Encounter  Medication Sig Dispense Refill   azelastine (ASTELIN) 0.1 % nasal spray Place 2 sprays into both nostrils 2 (two) times daily. 30 mL 12   busPIRone (BUSPAR) 10 MG tablet Take 10mg  in the morning and 20mg  at night. (Patient taking differently: Take 10 mg by mouth 2 (two) times daily.) 90 tablet 11   doxycycline (VIBRA-TABS) 100 MG tablet Take 1 tablet (100 mg total) by mouth 2 (two) times daily. 20 tablet 0   ferrous gluconate (FERGON) 324 MG tablet Take 324 mg by mouth daily with breakfast.     hydrocortisone (ANUSOL-HC) 2.5 % rectal cream APPLY RECTALLY TO THE AFFECTED AREA THREE TIMES DAILY 30 g 1   LORazepam (ATIVAN) 0.5 MG tablet TAKE 1/2 TABLET(0.25 MG) BY MOUTH DAILY AS NEEDED FOR ANXIETY 30 tablet 5   metoprolol succinate (TOPROL-XL) 50 MG 24 hr tablet TAKE 1 TABLET BY MOUTH TWICE DAILY WITH OR IMMEDIATELY FOLLOWING A MEAL 180 tablet 2   Multiple Vitamin (MULTIVITAMIN) tablet Take 1 tablet by mouth daily.     pantoprazole (PROTONIX) 40 MG tablet TAKE 1 TABLET(40 MG) BY MOUTH IN THE MORNING 90 tablet 1   simvastatin (ZOCOR) 40 MG tablet TAKE 1 TABLET(40 MG) BY MOUTH AT BEDTIME 30 tablet 0   traZODone (DESYREL) 50 MG tablet TAKE 1/2 TO  1 TABLET(25 TO 50 MG) BY MOUTH AT BEDTIME AS NEEDED FOR SLEEP 30 tablet 3   valACYclovir (VALTREX) 500 MG tablet Take 1 tablet (500 mg total) by mouth daily. 90 tablet 3   Social History   Socioeconomic History   Marital status: Married    Spouse name: Ahana Handler   Number of children: 1   Years of education: Not on file   Highest education level: Not on file  Occupational History    Employer: [employer]  Tobacco Use   Smoking status: Never   Smokeless tobacco: Never  Vaping Use   Vaping Use: Never used  Substance and Sexual Activity   Alcohol use: No   Drug use: No   Sexual activity: Yes    Birth control/protection: Other-see comments     Comment: 06-19-22 states continuously bleeding  Other Topics Concern   Not on file  Social History Narrative   Lives with husband and daughter.    Social Determinants of Health   Financial Resource Strain: Not on file  Food Insecurity: Not on file  Transportation Needs: Not on file  Physical Activity: Not on file  Stress: Not on file  Social Connections: Not on file  Intimate Partner Violence: Not on file   Family History  Problem Relation Age of Onset   Hypertension Mother    Hyperlipidemia Mother    Diabetes Father    Hyperlipidemia Father    Hypertension Father    Kidney disease Father    Hypertension Brother    Cancer Maternal Grandmother    Cancer Maternal Grandfather    Cancer Paternal Grandmother    Heart disease Paternal Grandmother    Heart disease Paternal Grandfather    Hypertension Paternal Grandfather    Colon cancer Neg Hx    Esophageal cancer Neg Hx    Liver cancer Neg Hx    Pancreatic cancer Neg Hx    Rectal cancer Neg Hx    Stomach cancer Neg Hx     OBJECTIVE:  Vitals:   10/27/22 0822  BP: 129/71  Pulse: 75  Resp: 18  Temp: 97.9 F (36.6 C)  TempSrc: Oral  SpO2: 98%     Physical Exam Vitals and nursing note reviewed.  Constitutional:      General: She is not in acute distress.    Appearance: Normal appearance. She is normal weight. She is not ill-appearing, toxic-appearing or diaphoretic.  HENT:     Head: Normocephalic.     Right Ear: Tympanic membrane and ear canal normal. There is no impacted cerumen.     Left Ear: Tympanic membrane and ear canal normal. There is no impacted cerumen.     Nose: Nose normal. No congestion.  Cardiovascular:     Rate and Rhythm: Normal rate and regular rhythm.     Pulses: Normal pulses.     Heart sounds: Normal heart sounds. No murmur heard.    No friction rub. No gallop.  Pulmonary:     Effort: Pulmonary effort is normal. No respiratory distress.     Breath sounds: Normal breath sounds. No stridor.  No wheezing, rhonchi or rales.  Chest:     Chest wall: No tenderness.  Neurological:     Mental Status: She is alert and oriented to person, place, and time.       RADIOLOGY  DG Chest 2 View  Result Date: 10/27/2022 CLINICAL DATA:  Cough/SOB EXAM: CHEST - 2 VIEW COMPARISON:  09/17/2022 FINDINGS: The heart size and mediastinal  contours are within normal limits. Both lungs are clear. The visualized skeletal structures are unremarkable except for similar thoracolumbar scoliosis. Trachea midline. IMPRESSION: No active cardiopulmonary disease. Electronically Signed   By: Jerilynn Mages.  Shick M.D.   On: 10/27/2022 09:06     Chest X-ray is negative for acute cardiopulmonary disease.  I have reviewed the x-ray myself and the radiologist interpretation.  I am in agreement with the radiologist interpretation.   LABS:  No results found for this or any previous visit (from the past 24 hour(s)).   ASSESSMENT & PLAN:  1. Acute cough     Meds ordered this encounter  Medications   DISCONTD: methylPREDNISolone sodium succinate (SOLU-MEDROL) 125 mg/2 mL injection 60 mg    IV methylprednisolone will be converted to either a q12h or q24h frequency with the same total daily dose (TDD).  Ordered Dose: 1 to 125 mg TDD; convert to: TDD q24h.  Ordered Dose: 126 to 250 mg TDD; convert to: TDD div q12h.  Ordered Dose: >250 mg TDD; DAW.   dexamethasone (DECADRON) injection 10 mg   promethazine-dextromethorphan (PROMETHAZINE-DM) 6.25-15 MG/5ML syrup    Sig: Take 5 mLs by mouth 4 (four) times daily as needed for cough.    Dispense:  118 mL    Refill:  0   benzonatate (TESSALON) 100 MG capsule    Sig: Take 1 capsule (100 mg total) by mouth every 8 (eight) hours.    Dispense:  21 capsule    Refill:  0   Discharge instructions   Get plenty of rest and push fluid Decadron IM was given in office Promethazine DM was prescribed for cough/take as prescribed.  This medication can make you sleepy Continue to take  Tessalon Perles 100 mg every 4 hours.  No more than 6 tablets in 24 hours Continue to use albuterol 2 puff every 6 hours Use medications daily for symptom relief Use OTC medications like ibuprofen or tylenol as needed fever or pain Follow up with PCP in 1-2 days via phone or e-visit for recheck and to ensure symptoms are improving Call or go to the ED if you have any new or worsening symptoms such as fever, worsening cough, shortness of breath, chest tightness, chest pain, turning blue, changes in mental status, etc...    .   Reviewed expectations re: course of current medical issues. Questions answered. Outlined signs and symptoms indicating need for more acute intervention. Patient verbalized understanding. After Visit Summary given.          Emerson Monte, [place] 10/27/22 [phone]

## 2022-10-29 ENCOUNTER — Telehealth: Payer: Self-pay | Admitting: Family Medicine

## 2022-10-29 NOTE — Telephone Encounter (Signed)
Patient states that since yesterday 10/28/22 her arms and legs have been extremely weak and getting worse. States she is unable to open jars or medication bottles, or lift things has difficulty standing up from a sitting position-legs have trouble holding Patient up.  Transferred to Triage Shanon Brow).

## 2022-10-30 NOTE — Telephone Encounter (Signed)
See note

## 2022-10-30 NOTE — Telephone Encounter (Signed)
Patient was advised to call 911   Patient Name: Caroline Peterson Gender: Female DOB: 01-10-1976 Age: 47 Y 13 M 24 D Return Phone Number: [phone] (Primary) Address: City/ State/ Zip: [place]  [address] Client [facility] Client Site [facility] [facility] Provider Dimas Chyle- MD Contact Type Call Who Is Calling Patient / Member / Family / Caregiver Call Type Triage / Clinical Relationship To Patient Self Return Phone Number [phone] (Primary) Chief Complaint WEAKNESS - sudden on one side of face or body Reason for Call Symptomatic / Request for [place] states since yesterday, arms and legs have been weak and getting worse, has difficulty standing up from sitting due to leg weakness, transferred from office for urgent triage Translation No Nurse Assessment Nurse: Susy Manor, RN, Megan Date/Time ([place] Time): 10/29/2022 1:31:20 PM Confirm and document reason for call. If symptomatic, describe symptoms. ---Caller states since yesterday, arms and legs have been weak and getting worse, has difficulty standing up from sitting due to leg weakness. Caller states she started having issues in December. She had left sided weakness in February and testing came back normal Does the patient have any new or worsening symptoms? ---Yes Will a triage be completed? ---Yes Related visit to physician within the last 2 weeks? ---No Does the PT have any chronic conditions? (i.e. diabetes, asthma, this includes High risk factors for pregnancy, etc.) ---No Is the patient pregnant or possibly pregnant? (Ask all females between the ages of 52-55) ---No Is this a behavioral health or substance abuse call? ---No Guidelines Guideline Title Affirmed Question Affirmed Notes Nurse Date/Time (Eastern Time) Weakness (Generalized) and Fatigue [1] SEVERE weakness (i.e., unable to walk  or barely able to walk, Susy Manor, RN, [name] 10/29/2022 1:33:27 PM  Guidelines Guideline Title Affirmed Question Affirmed Notes Nurse Date/Time (Eastern Time) requires support) AND [2] new-onset or worsening Disp. Time Eilene Ghazi Time) Disposition Final User 10/29/2022 1:30:43 PM Send to Urgent Queue Dalia Heading 10/29/2022 1:35:21 PM Call EMS 911 Now Yes Susy Manor, RN, Megan 10/29/2022 1:36:23 PM 911 Outcome Documentation Susy Manor, RN, [name] Reason: caller refusing to call 911. She will have someone take her to ER now. Final Disposition 10/29/2022 1:35:21 PM Call EMS 911 Now Yes Susy Manor, RN, [name] Caller Disagree/Comply Comply Caller Understands Yes PreDisposition Call Doctor Care Advice Given Per Guideline CALL EMS 911 NOW: * Immediate medical attention is needed. You need to hang up and call 911 (or an ambulance). CARE ADVICE given per Weakness and Fatigue (Adult) guideline. Referrals [facility] - ED

## 2022-11-01 ENCOUNTER — Telehealth: Payer: Self-pay | Admitting: Family Medicine

## 2022-11-01 ENCOUNTER — Other Ambulatory Visit: Payer: Self-pay | Admitting: Family Medicine

## 2022-11-01 NOTE — Telephone Encounter (Signed)
Discussed case with neurosurgeon.  She is having progressive muscle weakness and he is concerned about myositis or immune mediated necrotizing myopathy due to her statin use. She has stopped taking her statin however progressive weakness has continued.  Her recent MRI brain and C-spine do not explain her progressive weakness and he does not think that this is a neurosurgical issue.  She does have a neurologist and has an upcoming EMG study pending for further evaluation.   Team, can we make sure she has follow up with neurology scheduled ASAP? Her EMG is a few weeks away but it may be reasonable to move this up.   Also it would be a good idea if we have her see a rheumatologist due to the concern for an autoimmune disorder. Please place urgent referral if patient is agreeabe.  Additionally, I have not seen her since last year - can we make sure she has follow up with me scheduled?  Thanks- -CMP

## 2022-11-01 NOTE — Telephone Encounter (Signed)
Dr. Maia Petties requests to be called on cell# [phone] to discuss Patient Diagnosis

## 2022-11-02 ENCOUNTER — Other Ambulatory Visit: Payer: Self-pay

## 2022-11-02 DIAGNOSIS — G7289 Other specified myopathies: Secondary | ICD-10-CM

## 2022-11-02 NOTE — Telephone Encounter (Signed)
[facility] declined the referral awaiting on an answer from [place]

## 2022-11-02 NOTE — Telephone Encounter (Signed)
Spoke with patient and sent message via mychart. Pt states she was able to schedule EMG sooner, but preferred waiting until spring break to schedule appts so she could save as much of her PTO as possible given her current health conditions. I have placed STAT referral with rheumatology and schedule appt with PCP.

## 2022-11-08 ENCOUNTER — Ambulatory Visit: Payer: BC Managed Care – PPO | Admitting: Family Medicine

## 2022-11-14 ENCOUNTER — Ambulatory Visit (INDEPENDENT_AMBULATORY_CARE_PROVIDER_SITE_OTHER): Payer: BC Managed Care – PPO | Admitting: Family Medicine

## 2022-11-14 ENCOUNTER — Encounter: Payer: Self-pay | Admitting: Family Medicine

## 2022-11-14 ENCOUNTER — Ambulatory Visit (INDEPENDENT_AMBULATORY_CARE_PROVIDER_SITE_OTHER): Payer: BC Managed Care – PPO | Admitting: Neurology

## 2022-11-14 VITALS — BP 107/69 | HR 65 | Temp 97.5°F | Ht 64.0 in | Wt 143.2 lb

## 2022-11-14 DIAGNOSIS — I159 Secondary hypertension, unspecified: Secondary | ICD-10-CM

## 2022-11-14 DIAGNOSIS — I672 Cerebral atherosclerosis: Secondary | ICD-10-CM | POA: Diagnosis not present

## 2022-11-14 DIAGNOSIS — R531 Weakness: Secondary | ICD-10-CM

## 2022-11-14 DIAGNOSIS — Z789 Other specified health status: Secondary | ICD-10-CM | POA: Insufficient documentation

## 2022-11-14 NOTE — Procedures (Signed)
Full Name: Caroline Peterson Gender: Female MRN #: [id number] Date of Birth: [birth year]    Visit Date: 11/14/2022 14:31 Age: 47 Years Examining Physician: Dr. Marcial Pacas Referring Physician: Dr. Marcial Pacas Height: 5 feet 4 inch History: 47 year old female with transient muscle weakness taking statin, now much improved after stopping  Summary of the test: Nerve conduction study: Bilateral median, ulnar sensory and motor responses were normal.  Electromyography: Selected needle examination right upper extremity muscles, right cervical paraspinal, right lower extremity muscles were normal.    Conclusion: This is a normal study.  There is no electrodiagnostic evidence of intrinsic muscle disease, right cervical radiculopathy or peripheral neuropathy.    ------------------------------- Marcial Pacas, M.D. PhD  [facility] [address], [place], [place] [address] Tel: [phone] Fax: [phone]  Verbal informed consent was obtained from the patient, patient was informed of potential risk of procedure, including bruising, bleeding, hematoma formation, infection, muscle weakness, muscle pain, numbness, among others.        [place]    Nerve / Sites Muscle Latency Ref. Amplitude Ref. Rel Amp Segments Distance Velocity Ref. Area    ms ms mV mV %  cm m/s m/s mVms  R Median - APB     Wrist APB 3.5 ?4.4 11.2 ?4.0 100 Wrist - APB 7   48.8     Upper arm APB 7.6  10.4  92.9 Upper arm - Wrist 23 57 ?49 45.2  L Median - APB     Wrist APB 3.3 ?4.4 8.2 ?4.0 100 Wrist - APB 7   38.9     Upper arm APB 7.2  8.2  99.9 Upper arm - Wrist 23 59 ?49 37.4  R Ulnar - ADM     Wrist ADM 2.5 ?3.3 10.0 ?6.0 100 Wrist - ADM 7   29.9     B.Elbow ADM 4.5  9.3  92.6 B.Elbow - Wrist 13 64 ?49 30.9     A.Elbow ADM 6.5  9.6  104 A.Elbow - B.Elbow 12.6 63 ?49 30.9  L Ulnar - ADM     Wrist ADM 2.6 ?3.3 7.6 ?6.0 100 Wrist - ADM 7   27.1     B.Elbow ADM 4.7  6.2  81.2 B.Elbow -  Wrist 13 63 ?49 23.4     A.Elbow ADM 6.2  7.1  116 A.Elbow - B.Elbow 11 73 ?49 28.0             SNC    Nerve / Sites Rec. Site Peak Lat Ref.  Amp Ref. Segments Distance    ms ms V V  cm  R Median - Orthodromic (Dig II, Mid palm)     Dig II Wrist 3.0 ?3.4 12 ?10 Dig II - Wrist 13  L Median - Orthodromic (Dig II, Mid palm)     Dig II Wrist 2.9 ?3.4 20 ?10 Dig II - Wrist 13  R Ulnar - Orthodromic, (Dig V, Mid palm)     Dig V Wrist 2.5 ?3.1 12 ?5 Dig V - Wrist 11  L Ulnar - Orthodromic, (Dig V, Mid palm)     Dig V Wrist 2.6 ?3.1 14 ?5 Dig V - Wrist 19             F  Wave    Nerve F Lat Ref.   ms ms  R Ulnar - ADM 24.7 ?32.0  L Ulnar - ADM 23.8 ?32.0  EMG Summary Table    Spontaneous MUAP Recruitment  Muscle IA Fib PSW Fasc Other Amp Dur. Poly Pattern  R. First dorsal interosseous Normal None None None _______ Normal Normal Normal Normal  R. Pronator teres Normal None None None _______ Normal Normal Normal Normal  R. Biceps brachii Normal None None None _______ Normal Normal Normal Normal  R. Deltoid Normal None None None _______ Normal Normal Normal Normal  R. Triceps brachii Normal None None None _______ Normal Normal Normal Normal  R. Cervical paraspinals Normal None None None _______ Normal Normal Normal Normal  R. Tibialis anterior Normal None None None _______ Normal Normal Normal Normal

## 2022-11-14 NOTE — Assessment & Plan Note (Signed)
Blood pressure at goal on metoprolol succinate 50 mg daily. 

## 2022-11-14 NOTE — Progress Notes (Signed)
Caroline Peterson is a 47 y.o. female who presents today for an office visit.  Assessment/Plan:  New/Acute Problems: Generalized Weakness Thankfully she is now back to her baseline.  She has EMG pending and rheumatology visit pending as well.  Symptoms were likely secondary to statin use however it is unusual it would take almost 2 months since stopping medication for her symptoms to resolve if it was due to her statin.  Advised patient to keep her appointments for both EMG and rheumatology visit in case symptoms return.  She has follow-up with neurology planned as well.  Will need to avoid statins going forward in the future.  We discussed reasons to return to care.  She will let us know if symptoms return.  Chronic Problems Addressed Today: Statin intolerance Patient with profound weakness likely secondary to statin use.  We will need to avoid statins going forward.  Given her family history, she is very concerned about future cardiac events and would like to do something to help lower overall risk.  Will place referral to advanced lipid clinic to discuss further management.  Hypertension Blood pressure at goal on metoprolol succinate 50 mg daily.  Intracranial atherosclerosis Instantly found on CT scan maxillofacial last year.  She does have strong family history of vascular disease as noted above and she wishes to be on something to help lower her risk of heart attack and stroke in the future.  Will need to avoid statins.  Will be referring to lipid clinic as above.     Subjective:  HPI:  Patient here for follow-up.  We last saw her about 5 or 6 months ago.  At her last visit we started her on lipitor 40mg  daily for mildly elevated LDL and age advanced intracranial atherosclerosis found on incidentally on head CT scan. She started this a couple of months after our visit but soon after starting developing significant weakness. She sent [place] a message and we switched her over  to simvastatin. She was on this for a few weeks however still had progressive and worsening weakness.  She ended up having a significant episode of weakness resulting in a fall.  She ended up going to the ED and had significant workup there including labs and MRI brain and C-spine.  Her MRI brain was negative.  MRI C-spine showed degenerative changes without any cord compression.  She was told to stop her statin and was discharged home.  She was told to follow-up with neurology.  Neurology ordered labs which were negative. EMG was ordered however this has not yet been completed.   She ended up calling and schedule appointment with a spine specialist due to her cervical spine MRI findings.  They were not concerned about any potential neurosurgical issue however were more concerned that her symptoms were secondary to statin use and they were concerned about a possible immune mediated necrotizing myopathy.  Over the last couple of weeks she has been doing much better.  She has started taking co-Q10 supplement calcium supplement.  Patient states that most of her symptoms have resolved since she saw the neurosurgeon.  She is no longer on a statin.  No longer having weakness.  She feels like she is back to her baseline.        Objective:  Physical Exam: BP 107/69   Pulse 65   Temp (!) 97.5 F (36.4 C) (Temporal)   Ht 5\' 4"  (1.626 m)   Wt 143 lb 3.2 oz (65 kg)  LMP 11/06/2022   SpO2 99%   BMI 24.58 kg/m   Gen: No acute distress, resting comfortably CV: Regular rate and rhythm with no murmurs appreciated Pulm: Normal work of breathing, clear to auscultation bilaterally with no crackles, wheezes, or rhonchi Neuro: Grossly normal, moves all extremities Psych: Normal affect and thought content  Time Spent: 45 minutes of total time was spent on the date of the encounter performing the following actions: chart review prior to seeing the patient including recent ED visit and specialist visits,  obtaining history, performing a medically necessary exam, counseling on the treatment plan, placing orders, and documenting in our EHR.        Algis Greenhouse. Jerline Pain, MD 11/14/2022 11:10 AM

## 2022-11-14 NOTE — Patient Instructions (Signed)
It was very nice to see you today!  I am so glad that you are feeling better!  You were probably having a bad reaction to the statin.  We should avoid all statin medications going forward.  I will refer you to see the cholesterol specialist to discuss alternatives.   You can continue your supplements for now.  Take care, Dr Jerline Pain  PLEASE NOTE:  If you had any lab tests, please let us know if you have not heard back within a few days. You may see your results on mychart before we have a chance to review them but we will give you a call once they are reviewed by [place].   If we ordered any referrals today, please let us know if you have not heard from their office within the next week.   If you had any urgent prescriptions sent in today, please check with the pharmacy within an hour of our visit to make sure the prescription was transmitted appropriately.   Please try these tips to maintain a healthy lifestyle:  Eat at least 3 REAL meals and 1-2 snacks per day.  Aim for no more than 5 hours between eating.  If you eat breakfast, please do so within one hour of getting up.   Each meal should contain half fruits/vegetables, one quarter protein, and one quarter carbs (no bigger than a computer mouse)  Cut down on sweet beverages. This includes juice, soda, and sweet tea.   Drink at least 1 glass of water with each meal and aim for at least 8 glasses per day  Exercise at least 150 minutes every week.

## 2022-11-14 NOTE — Assessment & Plan Note (Signed)
Instantly found on CT scan maxillofacial last year.  She does have strong family history of vascular disease as noted above and she wishes to be on something to help lower her risk of heart attack and stroke in the future.  Will need to avoid statins.  Will be referring to lipid clinic as above.

## 2022-11-14 NOTE — Assessment & Plan Note (Signed)
Patient with profound weakness likely secondary to statin use.  We will need to avoid statins going forward.  Given her family history, she is very concerned about future cardiac events and would like to do something to help lower overall risk.  Will place referral to advanced lipid clinic to discuss further management.

## 2022-11-15 NOTE — Progress Notes (Signed)
  Cardiology Office Note:   Date:  11/16/2022  ID:  Caroline Peterson, DOB [birth year], MRN [id number]  History of Present Illness:   Caroline Peterson is a 46 y.o. female who presents for follow up of palpitations.   She has had quite the odyssey since I saw her.  She had started some statins because an MRI of her brain demonstrated some calcifications.  This was in October.  She was on Lipitor weakness.  She was to start Zocor but prior to that she had nasal surgery and she had a lot of trouble with anesthesia and lots of weakness.  This was immediately after surgery.  She did later start Zocor and in February had an episode of diffuse left-sided weakness.  She went to the emergency room.  She had a negative workup.  She saw neurology.  Zocor was stopped at that point in time.  The weakness was progressively worse.  She finally just prayed for healing and everything got better.  She has since not had any further weakness.  She has not had any chest pressure, neck or arm discomfort.  She had no new shortness of breath, PND or orthopnea.  ROS: As stated in the HPI and negative for all other systems.  Studies Reviewed:    EKG: September 17, 2022 sinus rhythm, rate 66, axis within normal limits, intervals within normal limits, no acute ST-T wave changes.  Risk Assessment/Calculations:              Physical Exam:   VS:  BP 110/70   Pulse 65   Ht  (1.626 m)   Wt 143 lb 3.2 oz (65 kg)   LMP 11/06/2022   SpO2 98%   BMI 24.58 kg/m    Wt Readings from Last 3 Encounters:  11/16/22 143 lb 3.2 oz (65 kg)  11/14/22 143 lb (64.9 kg)  11/14/22 143 lb 3.2 oz (65 kg)     GEN: Well nourished, well developed in no acute distress NECK: No JVD; No carotid bruits CARDIAC: RRR, no murmurs, rubs, gallops RESPIRATORY:  Clear to auscultation without rales, wheezing or rhonchi  ABDOMEN: Soft, non-tender, non-distended EXTREMITIES:  No edema; No deformity   ASSESSMENT AND PLAN:    Palpitations - These are sporadic and not particular problematic.  No change in therapy.   She is done very well with the XL of metoprolol twice daily and I will continue this.    Mitral regurgitation - She had a previous history of this but I do not hear a murmur and I will follow this clinically.  No change in therapy.   HTN - Her blood pressure is well-controlled.  Continue meds as listed.  Her blood pressure at target.  No change in therapy.         Signed, Rollene Rotunda, MD

## 2022-11-16 ENCOUNTER — Ambulatory Visit: Payer: BC Managed Care – PPO | Attending: Cardiology | Admitting: Cardiology

## 2022-11-16 ENCOUNTER — Encounter: Payer: Self-pay | Admitting: Cardiology

## 2022-11-16 VITALS — BP 110/70 | HR 65 | Ht 64.0 in | Wt 143.2 lb

## 2022-11-16 DIAGNOSIS — R002 Palpitations: Secondary | ICD-10-CM

## 2022-11-16 DIAGNOSIS — I10 Essential (primary) hypertension: Secondary | ICD-10-CM

## 2022-11-16 DIAGNOSIS — I340 Nonrheumatic mitral (valve) insufficiency: Secondary | ICD-10-CM | POA: Diagnosis not present

## 2022-11-16 NOTE — Patient Instructions (Signed)
Medication Instructions:  Your physician recommends that you continue on your current medications as directed. Please refer to the Current Medication list given to you today.  *If you need a refill on your cardiac medications before your next appointment, please call your pharmacy*  Follow-Up: At [facility], you and your health needs are our priority.  As part of our continuing mission to provide you with exceptional heart care, we have created designated Provider Care Teams.  These Care Teams include your primary Cardiologist (physician) and Advanced Practice Providers (APPs -  Physician Assistants and Nurse Practitioners) who all work together to provide you with the care you need, when you need it.  We recommend signing up for the patient portal called "MyChart".  Sign up information is provided on this After Visit Summary.  MyChart is used to connect with patients for Virtual Visits (Telemedicine).  Patients are able to view lab/test results, encounter notes, upcoming appointments, etc.  Non-urgent messages can be sent to your provider as well.   To learn more about what you can do with MyChart, go to [email or web address].    Your next appointment:   12 month(s)  Provider:   James Hochrein, MD     

## 2022-12-01 ENCOUNTER — Encounter: Payer: Self-pay | Admitting: Family Medicine

## 2022-12-28 NOTE — Anesthesia Preprocedure Evaluation (Addendum)
Anesthesia Evaluation  Patient identified by MRN, date of birth, ID band Patient awake    Reviewed: Allergy & Precautions, NPO status , Patient's Chart, lab work & pertinent test results, reviewed documented beta blocker date and time   History of Anesthesia Complications (+) PROLONGED EMERGENCE and history of anesthetic complications (after sinus surgery)  Airway Mallampati: II  TM Distance: >3 FB Neck ROM: Full    Dental no notable dental hx.    Pulmonary neg pulmonary ROS   Pulmonary exam normal breath sounds clear to auscultation       Cardiovascular hypertension (131/72 preop), Pt. on medications and Pt. on home beta blockers Normal cardiovascular exam Rhythm:Regular Rate:Normal     Neuro/Psych  PSYCHIATRIC DISORDERS Anxiety     negative neurological ROS     GI/Hepatic Neg liver ROS,GERD  Controlled,,  Endo/Other  negative endocrine ROS    Renal/GU negative Renal ROS  negative genitourinary   Musculoskeletal  (+) Arthritis , Osteoarthritis,    Abdominal   Peds  Hematology  (+) Blood dyscrasia, anemia Hb 11.4   Anesthesia Other Findings   Reproductive/Obstetrics negative OB ROS                             Anesthesia Physical Anesthesia Plan  ASA: 2  Anesthesia Plan: General   Post-op Pain Management: Tylenol PO (pre-op)* and Toradol IV (intra-op)*   Induction: Intravenous  PONV Risk Score and Plan: 3 and Ondansetron, Dexamethasone, Midazolam, Treatment may vary due to age or medical condition and Scopolamine patch - Pre-op  Airway Management Planned: LMA  Additional Equipment: None  Intra-op Plan:   Post-operative Plan: Extubation in OR  Informed Consent: I have reviewed the patients History and Physical, chart, labs and discussed the procedure including the risks, benefits and alternatives for the proposed anesthesia with the patient or authorized representative who has  indicated his/her understanding and acceptance.     Dental advisory given  Plan Discussed with: CRNA  Anesthesia Plan Comments: (12/28/22 - I had a conversation with Ms Bumstead about her concerns with anesthesia.  She had a nasal surgery at [facility] on 08/14/22 where she was excessively sleepy and sedated in PACU following the procedure.  She described desaturating several times and the RN said she had to give a jaw thrust to maintain her airway.  The anesthesia record is in Care Everywhere.  Aside from a routine GETA, precedex was given intraoperatively.  She requests that adjustments are made to prevent repeating this experience.  Achille Rich MD)       Anesthesia Quick Evaluation

## 2023-01-11 ENCOUNTER — Ambulatory Visit
Payer: BC Managed Care – PPO | Attending: Cardiology | Admitting: Pharmacist Clinician (PhC)/ Clinical Pharmacy Specialist

## 2023-01-11 DIAGNOSIS — E785 Hyperlipidemia, unspecified: Secondary | ICD-10-CM | POA: Diagnosis not present

## 2023-01-11 NOTE — Progress Notes (Signed)
Office Visit    Patient Name: Caroline Peterson Date of Encounter: 01/17/2023  Primary Care Provider:  Ardith Dark, MD Primary Cardiologist:  Rollene Rotunda, MD  Chief Complaint    Hyperlipidemia   Significant Past Medical History   hypertension   Intracranial atherosclerosis Noted on CT scan 05/18/21 in Care Everywhere     Allergies  Allergen Reactions   Aspirin Nausea And Vomiting    "can take coated aspirin"   Augmentin [Amoxicillin-Pot Clavulanate]     Diarrhea   Erythromycin Nausea And Vomiting   Levaquin [Levofloxacin] Other (See Comments)    Trigger thumb; arthralgias   Nitrofurantoin Other (See Comments)    Unknown    Azithromycin Nausea And Vomiting, Palpitations and Other (See Comments)    "made my heart speed up"    History of Present Illness    Caroline Peterson is a 47 y.o. female patient of Dr Antoine Poche who was referred by her PCP for lipid management.  She has been intolerant of statin drugs secondary to muscle weakness.  In 2022 a CT done at [facility] mentioned intracranial atherosclerosis, but she has never had a calcium score or other quantifiable measure of atherosclerosis  Insurance Carrier: [employer]  LDL Cholesterol goal:  LDL < 70  Current Medications:  none   Previously tried:  atorvastatin, simvastatin - muscle weakness  Family Hx:  father died MI/stroke at 64, pgf had MI, paternal uncle had several MI's;  mother with DM, hyperlipidemia; 1 brother with hypertension, daughter 22  Social Hx: Tobacco: no Alcohol: occasional    Diet:  mix of home and eating out, eating out includes fast foods; doesn't eat much for veggies - cauliflower/carrots yesterday; plenty of proteins; some snacking    Exercise: no  Accessory Clinical Findings   Lab Results  Component Value Date   CHOL 204 (H) 03/14/2022   HDL 56.00 03/14/2022   LDLCALC 126 (H) 03/14/2022   TRIG 111.0 03/14/2022   CHOLHDL 4 03/14/2022     Lab Results  Component Value Date   ALT 15 03/14/2022   AST 15 03/14/2022   ALKPHOS 83 03/14/2022   BILITOT 0.5 03/14/2022   Lab Results  Component Value Date   CREATININE 0.65 09/17/2022   BUN 14 09/17/2022   NA 138 09/17/2022   K 3.7 09/17/2022   CL 107 09/17/2022   CO2 25 09/17/2022   Lab Results  Component Value Date   HGBA1C 5.5 03/14/2022    Home Medications    Current Outpatient Medications  Medication Sig Dispense Refill   busPIRone (BUSPAR) 10 MG tablet Take 10mg  in the morning and 20mg  at night. (Patient taking differently: Take 10 mg by mouth 2 (two) times daily. Take 10mg  in the morning and 20mg  at night.) 90 tablet 11   calcium citrate (CALCITRATE - DOSED IN MG ELEMENTAL CALCIUM) 950 (200 Ca) MG tablet Take 200 mg of elemental calcium by mouth 2 (two) times daily.     Coenzyme Q10 (COQ10) 100 MG CAPS Take 100 mg by mouth daily.     ferrous gluconate (FERGON) 324 MG tablet Take 324 mg by mouth every other day.     hydrocortisone (ANUSOL-HC) 2.5 % rectal cream APPLY RECTALLY TO THE AFFECTED AREA THREE TIMES DAILY 30 g 1   metoprolol succinate (TOPROL-XL) 50 MG 24 hr tablet TAKE 1 TABLET BY MOUTH TWICE DAILY WITH OR IMMEDIATELY FOLLOWING A MEAL 180 tablet 2   Multiple Vitamin (MULTIVITAMIN) tablet Take 1  tablet by mouth daily.     valACYclovir (VALTREX) 500 MG tablet Take 1 tablet (500 mg total) by mouth daily. 90 tablet 3   azelastine (ASTELIN) 0.1 % nasal spray Place 2 sprays into both nostrils 2 (two) times daily. (Patient not taking: Reported on 01/11/2023) 30 mL 12   LORazepam (ATIVAN) 0.5 MG tablet TAKE 1/2 TABLET(0.25 MG) BY MOUTH DAILY AS NEEDED FOR ANXIETY (Patient not taking: Reported on 01/11/2023) 30 tablet 5   traZODone (DESYREL) 50 MG tablet TAKE 1/2 TO 1 TABLET(25 TO 50 MG) BY MOUTH AT BEDTIME AS NEEDED FOR SLEEP (Patient not taking: Reported on 01/11/2023) 30 tablet 3   No current facility-administered medications for this visit.     Assessment & Plan     Dyslipidemia Assessment: Patient with hyperlipidemia not at LDL goal of < 70 Most recent LDL 126 on 03/14/22 Not able to tolerate statins secondary to myalgias Patient does not have any quantifiable justification for CAD, only mention of "intracranial atherosclerosis" on CT (Care Everywhere 05/18/21) Last lipid labs were 03/2022  Plan: Patient will need quantifiable CAD in order to get medication coverage, or an LDL > 190.   She will get coronary calcium scoring done as well as updated lipid labs When those result are available, we can better determine her need for lipid management.    Phillips Hay, PharmD CPP [facility] [address]  [place], [place] [address] [phone]  01/17/2023, 1:04 PM

## 2023-01-11 NOTE — Patient Instructions (Signed)
Your Results:             Your most recent labs Goal  Total Cholesterol 204 < 200  Triglycerides 110 < 150  HDL (happy/good cholesterol) 56 > 40  LDL (lousy/bad cholesterol 126 < 70   Medication changes:  You will need to get a coronary calcium CT to determine if there is any plaque in your coronary arteries.    Lab orders:  You will need to get your cholesterol labs repeated in the next few weeks.     Thank you for choosing [facility]

## 2023-01-17 ENCOUNTER — Encounter: Payer: Self-pay | Admitting: Pharmacist Clinician (PhC)/ Clinical Pharmacy Specialist

## 2023-01-17 ENCOUNTER — Other Ambulatory Visit: Payer: Self-pay | Admitting: Obstetrics and Gynecology

## 2023-01-17 NOTE — Assessment & Plan Note (Signed)
Assessment: Patient with hyperlipidemia not at LDL goal of < 70 Most recent LDL 126 on 03/14/22 Not able to tolerate statins secondary to myalgias Patient does not have any quantifiable justification for CAD, only mention of "intracranial atherosclerosis" on CT (Care Everywhere 05/18/21) Last lipid labs were 03/2022  Plan: Patient will need quantifiable CAD in order to get medication coverage, or an LDL > 190.   She will get coronary calcium scoring done as well as updated lipid labs When those result are available, we can better determine her need for lipid management.

## 2023-01-24 ENCOUNTER — Encounter (HOSPITAL_BASED_OUTPATIENT_CLINIC_OR_DEPARTMENT_OTHER): Payer: Self-pay | Admitting: Obstetrics and Gynecology

## 2023-01-25 ENCOUNTER — Encounter (HOSPITAL_BASED_OUTPATIENT_CLINIC_OR_DEPARTMENT_OTHER): Payer: Self-pay | Admitting: Obstetrics and Gynecology

## 2023-01-25 NOTE — Progress Notes (Signed)
Spoke w/ via phone for pre-op interview--- pt Lab needs dos----  cbc, istat, urine preg             Lab results------ current EKG in epic/ chart COVID test -----patient states asymptomatic no test needed Arrive at ------- 0630 on 02-13-2023 NPO after MN NO Solid Food.  Clear liquids from MN until--- 0530 Med rec completed Medications to take morning of surgery ----- buspar, toprol, eye drops Diabetic medication ----- n/a Patient instructed no nail polish to be worn day of surgery Patient instructed to bring photo id and insurance card day of surgery Patient aware to have Driver (ride ) / caregiver    for 24 hours after surgery -- mother, cheryl Patient Special Instructions ----- n/a Pre-Op special Instructions ----- n/a Patient verbalized understanding of instructions that were given at this phone interview. Patient denies shortness of breath, chest pain, fever, cough at this phone interview.

## 2023-02-05 ENCOUNTER — Other Ambulatory Visit (HOSPITAL_COMMUNITY): Payer: BC Managed Care – PPO

## 2023-02-13 ENCOUNTER — Encounter (HOSPITAL_BASED_OUTPATIENT_CLINIC_OR_DEPARTMENT_OTHER): Payer: Self-pay | Admitting: Obstetrics and Gynecology

## 2023-02-13 ENCOUNTER — Other Ambulatory Visit: Payer: Self-pay

## 2023-02-13 ENCOUNTER — Ambulatory Visit (HOSPITAL_BASED_OUTPATIENT_CLINIC_OR_DEPARTMENT_OTHER): Payer: BC Managed Care – PPO | Admitting: Anesthesiology

## 2023-02-13 ENCOUNTER — Encounter (HOSPITAL_BASED_OUTPATIENT_CLINIC_OR_DEPARTMENT_OTHER)
Admission: RE | Disposition: A | Discharge status: Home / Self Care | Payer: Self-pay | Source: Home / Self Care | Attending: Obstetrics and Gynecology

## 2023-02-13 ENCOUNTER — Ambulatory Visit (HOSPITAL_BASED_OUTPATIENT_CLINIC_OR_DEPARTMENT_OTHER)
Admission: RE | Admit: 2023-02-13 | Discharge: 2023-02-13 | Disposition: A | Discharge status: Home / Self Care | Payer: BC Managed Care – PPO | Attending: Obstetrics and Gynecology | Admitting: Obstetrics and Gynecology

## 2023-02-13 DIAGNOSIS — Z79899 Other long term (current) drug therapy: Secondary | ICD-10-CM | POA: Insufficient documentation

## 2023-02-13 DIAGNOSIS — N841 Polyp of cervix uteri: Secondary | ICD-10-CM | POA: Insufficient documentation

## 2023-02-13 DIAGNOSIS — N840 Polyp of corpus uteri: Secondary | ICD-10-CM | POA: Diagnosis not present

## 2023-02-13 DIAGNOSIS — D649 Anemia, unspecified: Secondary | ICD-10-CM | POA: Diagnosis not present

## 2023-02-13 DIAGNOSIS — N921 Excessive and frequent menstruation with irregular cycle: Secondary | ICD-10-CM | POA: Insufficient documentation

## 2023-02-13 DIAGNOSIS — I10 Essential (primary) hypertension: Secondary | ICD-10-CM | POA: Insufficient documentation

## 2023-02-13 DIAGNOSIS — F419 Anxiety disorder, unspecified: Secondary | ICD-10-CM | POA: Insufficient documentation

## 2023-02-13 DIAGNOSIS — Z01818 Encounter for other preprocedural examination: Secondary | ICD-10-CM

## 2023-02-13 HISTORY — DX: Dry eye syndrome of bilateral lacrimal glands: H04.123

## 2023-02-13 HISTORY — DX: Anatomical narrow angle, bilateral: H40.033

## 2023-02-13 HISTORY — DX: Nontoxic multinodular goiter: E04.2

## 2023-02-13 HISTORY — DX: Presence of spectacles and contact lenses: Z97.3

## 2023-02-13 HISTORY — DX: Unspecified osteoarthritis, unspecified site: M19.90

## 2023-02-13 HISTORY — DX: Cerebral atherosclerosis: I67.2

## 2023-02-13 HISTORY — PX: DILITATION & CURRETTAGE/HYSTROSCOPY WITH NOVASURE ABLATION: SHX5568

## 2023-02-13 HISTORY — DX: Palpitations: R00.2

## 2023-02-13 HISTORY — DX: Personal history of other complications of pregnancy, childbirth and the puerperium: Z87.59

## 2023-02-13 HISTORY — DX: Gastro-esophageal reflux disease without esophagitis: K21.9

## 2023-02-13 HISTORY — DX: Iron deficiency anemia, unspecified: D50.9

## 2023-02-13 HISTORY — DX: Nonrheumatic mitral (valve) insufficiency: I34.0

## 2023-02-13 HISTORY — DX: Other complications of anesthesia, initial encounter: T88.59XA

## 2023-02-13 HISTORY — DX: Personal history of Methicillin resistant Staphylococcus aureus infection: Z86.14

## 2023-02-13 HISTORY — DX: Excessive and frequent menstruation with regular cycle: N92.0

## 2023-02-13 HISTORY — DX: Family history of other specified conditions: Z84.89

## 2023-02-13 LAB — CBC
HCT: 36.5 % (ref 36.0–46.0)
Hemoglobin: 11.4 g/dL — ABNORMAL LOW (ref 12.0–15.0)
MCH: 28.9 pg (ref 26.0–34.0)
MCHC: 31.2 g/dL (ref 30.0–36.0)
MCV: 92.6 fL (ref 80.0–100.0)
Platelets: 261 10*3/uL (ref 150–400)
RBC: 3.94 MIL/uL (ref 3.87–5.11)
RDW: 13.9 % (ref 11.5–15.5)
WBC: 7.9 10*3/uL (ref 4.0–10.5)
nRBC: 0 % (ref 0.0–0.2)

## 2023-02-13 LAB — POCT PREGNANCY, URINE: Preg Test, Ur: NEGATIVE

## 2023-02-13 SURGERY — DILATATION & CURETTAGE/HYSTEROSCOPY WITH NOVASURE ABLATION
Anesthesia: General | Site: Vagina

## 2023-02-13 MED ORDER — ONDANSETRON HCL 4 MG/2ML IJ SOLN
INTRAMUSCULAR | Status: DC | PRN
  Administered 2023-02-13: 4 mg via INTRAVENOUS

## 2023-02-13 MED ORDER — OXYCODONE HCL 5 MG/5ML PO SOLN: 5.0000 mg | Freq: Once | ORAL | Status: DC | PRN

## 2023-02-13 MED ORDER — VASOPRESSIN 20 UNIT/ML IV SOLN
INTRAVENOUS | Status: DC | PRN
  Administered 2023-02-13: 18 mL via INTRAMUSCULAR

## 2023-02-13 MED ORDER — FENTANYL CITRATE (PF) 100 MCG/2ML IJ SOLN
INTRAMUSCULAR | Status: AC
  Filled 2023-02-13: qty 2

## 2023-02-13 MED ORDER — SODIUM CHLORIDE 0.9 % IR SOLN
Status: DC | PRN
  Administered 2023-02-13: 3000 mL

## 2023-02-13 MED ORDER — DEXAMETHASONE SODIUM PHOSPHATE 10 MG/ML IJ SOLN
INTRAMUSCULAR | Status: DC | PRN
  Administered 2023-02-13: 10 mg via INTRAVENOUS

## 2023-02-13 MED ORDER — ACETAMINOPHEN 500 MG PO TABS
ORAL_TABLET | ORAL | Status: AC
  Filled 2023-02-13: qty 2

## 2023-02-13 MED ORDER — ONDANSETRON HCL 4 MG/2ML IJ SOLN
INTRAMUSCULAR | Status: AC
  Filled 2023-02-13: qty 2

## 2023-02-13 MED ORDER — AMISULPRIDE (ANTIEMETIC) 5 MG/2ML IV SOLN: 10.0000 mg | Freq: Once | INTRAVENOUS | Status: DC | PRN

## 2023-02-13 MED ORDER — KETOROLAC TROMETHAMINE 30 MG/ML IJ SOLN
INTRAMUSCULAR | Status: AC
  Filled 2023-02-13: qty 1

## 2023-02-13 MED ORDER — ACETAMINOPHEN 500 MG PO TABS
1000.0000 mg | ORAL_TABLET | Freq: Once | ORAL | Status: AC
  Administered 2023-02-13: 1000 mg via ORAL

## 2023-02-13 MED ORDER — MEPERIDINE HCL 25 MG/ML IJ SOLN: 6.2500 mg | INTRAMUSCULAR | Status: DC | PRN

## 2023-02-13 MED ORDER — LIDOCAINE 2% (20 MG/ML) 5 ML SYRINGE
INTRAMUSCULAR | Status: DC | PRN
  Administered 2023-02-13: 60 mg via INTRAVENOUS

## 2023-02-13 MED ORDER — PHENYLEPHRINE 80 MCG/ML (10ML) SYRINGE FOR IV PUSH (FOR BLOOD PRESSURE SUPPORT)
PREFILLED_SYRINGE | INTRAVENOUS | Status: DC | PRN
  Administered 2023-02-13: 80 ug via INTRAVENOUS
  Administered 2023-02-13: 160 ug via INTRAVENOUS

## 2023-02-13 MED ORDER — LACTATED RINGERS IV SOLN: INTRAVENOUS | Status: DC

## 2023-02-13 MED ORDER — DEXAMETHASONE SODIUM PHOSPHATE 10 MG/ML IJ SOLN
INTRAMUSCULAR | Status: AC
  Filled 2023-02-13: qty 1

## 2023-02-13 MED ORDER — LIDOCAINE HCL (PF) 2 % IJ SOLN
INTRAMUSCULAR | Status: AC
  Filled 2023-02-13: qty 5

## 2023-02-13 MED ORDER — PROPOFOL 10 MG/ML IV BOLUS
INTRAVENOUS | Status: AC
  Filled 2023-02-13: qty 20

## 2023-02-13 MED ORDER — PROPOFOL 10 MG/ML IV BOLUS
INTRAVENOUS | Status: DC | PRN
  Administered 2023-02-13: 200 mg via INTRAVENOUS

## 2023-02-13 MED ORDER — HYDROMORPHONE HCL 1 MG/ML IJ SOLN: 0.2500 mg | INTRAMUSCULAR | Status: DC | PRN

## 2023-02-13 MED ORDER — MIDAZOLAM HCL 2 MG/2ML IJ SOLN
INTRAMUSCULAR | Status: AC
  Filled 2023-02-13: qty 2

## 2023-02-13 MED ORDER — KETOROLAC TROMETHAMINE 30 MG/ML IJ SOLN: 30.0000 mg | Freq: Once | INTRAMUSCULAR | Status: DC | PRN

## 2023-02-13 MED ORDER — CEFAZOLIN SODIUM-DEXTROSE 2-4 GM/100ML-% IV SOLN
INTRAVENOUS | Status: AC
  Filled 2023-02-13: qty 100

## 2023-02-13 MED ORDER — FENTANYL CITRATE (PF) 100 MCG/2ML IJ SOLN
INTRAMUSCULAR | Status: DC | PRN
  Administered 2023-02-13: 25 ug via INTRAVENOUS
  Administered 2023-02-13: 50 ug via INTRAVENOUS
  Administered 2023-02-13: 25 ug via INTRAVENOUS

## 2023-02-13 MED ORDER — CEFAZOLIN SODIUM-DEXTROSE 2-4 GM/100ML-% IV SOLN
2.0000 g | INTRAVENOUS | Status: AC
  Administered 2023-02-13: 2 g via INTRAVENOUS

## 2023-02-13 MED ORDER — TRAMADOL HCL 50 MG PO TABS: 50.0000 mg | ORAL_TABLET | Freq: Four times a day (QID) | ORAL | 0 refills | Status: DC | PRN

## 2023-02-13 MED ORDER — POVIDONE-IODINE 10 % EX SWAB: 2.0000 | Freq: Once | CUTANEOUS | Status: DC

## 2023-02-13 MED ORDER — KETOROLAC TROMETHAMINE 30 MG/ML IJ SOLN
INTRAMUSCULAR | Status: DC | PRN
  Administered 2023-02-13: 30 mg via INTRAVENOUS

## 2023-02-13 MED ORDER — ONDANSETRON HCL 4 MG/2ML IJ SOLN: 4.0000 mg | Freq: Once | INTRAMUSCULAR | Status: DC | PRN

## 2023-02-13 MED ORDER — OXYCODONE HCL 5 MG PO TABS: 5.0000 mg | ORAL_TABLET | Freq: Once | ORAL | Status: DC | PRN

## 2023-02-13 MED ORDER — BUPIVACAINE HCL (PF) 0.25 % IJ SOLN
INTRAMUSCULAR | Status: DC | PRN
  Administered 2023-02-13: 20 mL

## 2023-02-13 SURGICAL SUPPLY — 22 items
ABLATOR SURESOUND NOVASURE (ABLATOR) ×1 IMPLANT
BAG DECANTER FOR FLEXI CONT (MISCELLANEOUS) IMPLANT
DEVICE MYOSURE LITE (MISCELLANEOUS) IMPLANT
GAUZE 4X4 16PLY ~~LOC~~+RFID DBL (SPONGE) ×1 IMPLANT
GLOVE BIO SURGEON STRL SZ7.5 (GLOVE) ×1 IMPLANT
GLOVE BIOGEL PI IND STRL 7.0 (GLOVE) ×1 IMPLANT
GLOVE SURG SYN 6.5 ES PF (GLOVE) ×1 IMPLANT
GLOVE SURG SYN 6.5 PF PI (GLOVE) IMPLANT
GOWN STRL REUS W/ TWL LRG LVL3 (GOWN DISPOSABLE) IMPLANT
GOWN STRL REUS W/TWL LRG LVL3 (GOWN DISPOSABLE) ×3 IMPLANT
IV NS IRRIG 3000ML ARTHROMATIC (IV SOLUTION) IMPLANT
KIT PROCEDURE FLUENT (KITS) ×1 IMPLANT
KIT TURNOVER CYSTO (KITS) ×1 IMPLANT
PACK VAGINAL MINOR WOMEN LF (CUSTOM PROCEDURE TRAY) ×1 IMPLANT
PAD OB MATERNITY 4.3X12.25 (PERSONAL CARE ITEMS) ×1 IMPLANT
PAD PREP 24X48 CUFFED NSTRL (MISCELLANEOUS) ×1 IMPLANT
SEAL ROD LENS SCOPE MYOSURE (ABLATOR) ×1 IMPLANT
SLEEVE SCD COMPRESS KNEE MED (STOCKING) ×1 IMPLANT
SOL PREP POV-IOD 4OZ 10% (MISCELLANEOUS) IMPLANT
SPIKE FLUID TRANSFER (MISCELLANEOUS) IMPLANT
SYR 3ML 18GX1 1/2 (SYRINGE) IMPLANT
TOWEL OR 17X24 6PK STRL BLUE (TOWEL DISPOSABLE) ×1 IMPLANT

## 2023-02-13 NOTE — H&P (Signed)
Caroline Peterson is an 47 y.o. female. AUB and menorrhagia for surgical evaluation  Pertinent Gynecological History: Menses: flow is moderate Bleeding: intermenstrual bleeding Contraception: none DES exposure: denies Blood transfusions: none Sexually transmitted diseases: no past history Previous GYN Procedures: DNC  Last mammogram: normal Date: 2024 Last pap: normal Date: 2024 OB History: G2, P1   Menstrual History: Menarche age: 68 Patient's last menstrual period was 01/25/2023 (exact date).    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 Jhonnie Garner;  s/p yag laser peripheral iridotomy left eye 2018;  bilateral eye 2019   Arthritis    neck   Complication of anesthesia    per pt had issues post op in PACU with excessive sleepiness and being sedated following sinus surgery , nurse had to to jaw thrust to maintain airway.  anesthesia record in care everywhere date of surgery 08-14-2022 @ [facility]  (in epic pt spoke with one of our anesthesiologist, Dr A. Hodierne MDA, note dated 12-28-2022)   Dry eye syndrome of both eyes    Family history of adverse reaction to anesthesia    father--- hard to wake   GERD (gastroesophageal reflux disease)    01-25-2023 per pt does not take any meds   History of hemolysis, elevated liver enzymes, and low platelet (HELLP) syndrome    History of MRSA infection    boils on skin (from shaving)   Hypertension    IDA (iron deficiency anemia)    Intracranial atherosclerosis    followed by pcp;  incidental finding on CT/ MRI   Menorrhagia    Multiple thyroid nodules    incidental finding on head CT 2023/  ultrasound 05-11-2022 in [facility],  bilateral nodules , no bx , did not meet criteria   Nonrheumatic mitral (valve) insufficiency    Palpitations    cardiologist--- dr [place]   Wears glasses     Past Surgical History:  Procedure Laterality Date   CESAREAN  SECTION  11/06/2009   per pt w/ general anesthesia   COLONOSCOPY WITH ESOPHAGOGASTRODUODENOSCOPY (EGD)  06/19/2022   dr beavers   DILATION AND CURETTAGE OF UTERUS  09/06/2008   @ [facility];   for POC   NASAL/SINUS ENDOSCOPY  08/14/2022   @ [facility] by dr d. Hollace Hayward;  bilateral resection subucous turbinates,  septoplasty,  right maxillary antrostomy w/ removal tissue   TONSILLECTOMY AND ADENOIDECTOMY  1986   WISDOM TOOTH EXTRACTION  1994   per pt w/ anesthesia    Family History  Problem Relation Age of Onset   Hypertension Mother    Hyperlipidemia Mother    Diabetes Father    Hyperlipidemia Father    Hypertension Father    Kidney disease Father    Hypertension Brother    Cancer Maternal Grandmother    Cancer Maternal Grandfather    Cancer Paternal Grandmother    Heart disease Paternal Grandmother    Heart disease Paternal Grandfather    Hypertension Paternal Grandfather    Colon cancer Neg Hx    Esophageal cancer Neg Hx    Liver cancer Neg Hx    Pancreatic cancer Neg Hx    Rectal cancer Neg Hx    Stomach cancer Neg Hx     Social History:  reports that she has never smoked. She has never used smokeless tobacco. She reports current alcohol use. She reports that she does not use drugs.  Allergies:  Allergies  Allergen Reactions   Aspirin Nausea And Vomiting    "can take coated aspirin"   Augmentin [Amoxicillin-Pot Clavulanate] Diarrhea   Erythromycin Nausea And Vomiting   Levaquin [Levofloxacin] Other (See Comments)    Trigger thumb; arthralgias   Nitrofurantoin Nausea And Vomiting   Statins Other (See Comments)    Transient muscle weakness/ myalgias   Azithromycin Nausea And Vomiting, Palpitations and Other (See Comments)    "made my heart speed up"    Medications Prior to Admission  Medication Sig Dispense Refill Last Dose   azelastine (ASTELIN) 0.1 % nasal spray Place 2 sprays into both nostrils 2 (two) times daily. (Patient taking differently: Place 2  sprays into both nostrils 2 (two) times daily as needed for allergies or rhinitis.) 30 mL 12    busPIRone (BUSPAR) 10 MG tablet Take 10mg  in the morning and 20mg  at night. (Patient taking differently: Take 10 mg by mouth 2 (two) times daily.) 90 tablet 11    calcium citrate (CALCITRATE - DOSED IN MG ELEMENTAL CALCIUM) 950 (200 Ca) MG tablet Take 2 tablets by mouth daily with lunch.      Coenzyme Q10-Omega 3 Fatty Acd (COQMAX OMEGA) 50 MG CAPS Take 2 capsules by mouth daily with lunch.      COLLAGEN PO Take by mouth daily.      ferrous gluconate (FERGON) 324 MG tablet Take 324 mg by mouth every other day.      Glycerin-Hypromellose-PEG 400 (DRY EYE RELIEF DROPS OP) Place 1 drop into both eyes 2 (two) times daily.      hydrocortisone (ANUSOL-HC) 2.5 % rectal cream APPLY RECTALLY TO THE AFFECTED AREA THREE TIMES DAILY (Patient taking differently: Place 1 Application rectally 3 (three) times daily as needed for hemorrhoids.) 30 g 1    LORazepam (ATIVAN) 0.5 MG tablet TAKE 1/2 TABLET(0.25 MG) BY MOUTH DAILY AS NEEDED FOR ANXIETY (Patient taking differently: Take 0.5 mg by mouth as needed for anxiety.) 30 tablet 5    metoprolol succinate (TOPROL-XL) 50 MG 24 hr tablet TAKE 1 TABLET BY MOUTH TWICE DAILY WITH OR IMMEDIATELY FOLLOWING A MEAL (Patient taking differently: Take 50 mg by mouth 2 (two) times daily. TAKE 1 TABLET BY MOUTH TWICE DAILY WITH OR IMMEDIATELY FOLLOWING A MEAL) 180 tablet 2    Multiple Vitamin (MULTIVITAMIN) tablet Take 1 tablet by mouth daily with lunch.      traZODone (DESYREL) 50 MG tablet TAKE 1/2 TO 1 TABLET(25 TO 50 MG) BY MOUTH AT BEDTIME AS NEEDED FOR SLEEP (Patient taking differently: Take 50 mg by mouth at bedtime.) 30 tablet 3    valACYclovir (VALTREX) 500 MG tablet Take 1 tablet (500 mg total) by mouth daily. (Patient taking differently: Take 500 mg by mouth at bedtime.) 90 tablet 3     Review of Systems  Constitutional: Negative.   All other systems reviewed and are  negative.   Height 5\' 4"  (1.626 m), weight 65.3 kg, last menstrual period 01/25/2023. Physical Exam Constitutional:      Appearance: Normal appearance. She is normal weight.  HENT:     Head: Normocephalic and atraumatic.  Cardiovascular:     Rate and Rhythm: Normal rate and regular rhythm.  Pulmonary:     Breath sounds: Normal breath sounds.  Abdominal:     General: Bowel sounds are normal.     Palpations: Abdomen is soft.  Genitourinary:    General: Normal vulva.  Musculoskeletal:        General: Normal range of motion.  Cervical back: Normal range of motion and neck supple.  Skin:    General: Skin is warm.  Neurological:     General: No focal deficit present.     Mental Status: She is alert and oriented to person, place, and time.  Psychiatric:        Mood and Affect: Mood normal.        Behavior: Behavior normal.     No results found for this or any previous visit (from the past 24 hour(s)).  No results found.  Assessment/Plan: AUB - menorrhagia Diag HS, poss myosure, novasure.  Surgical risks noted. Risks of anesthesia, bleeding, injury to surrounding organs with need for repair discussed. Failure risks of EAB noted. Consent witnessed and signed  Lenoard Aden 02/13/2023, 6:34 AM

## 2023-02-13 NOTE — Discharge Instructions (Addendum)
  DISCHARGE INSTRUCTIONS: HYSTEROSCOPY / ENDOMETRIAL ABLATION The following instructions have been prepared to help you care for yourself upon your return home.  May take stool softner while taking narcotic pain medication to prevent constipation.  Drink plenty of water.  Personal hygiene:  Use sanitary pads for vaginal drainage, not tampons.  Shower the day after your procedure.  NO tub baths, pools or Jacuzzis for 2-3 weeks.  Wipe front to back after using the bathroom.  Activity and limitations:  Do NOT drive or operate any equipment for 24 hours. The effects of anesthesia are still present and drowsiness may result.  Do NOT rest in bed all day.  Walking is encouraged.  Walk up and down stairs slowly.  You may resume your normal activity in one to two days or as indicated by your physician. Sexual activity: NO intercourse for at least 2 weeks after the procedure, or as indicated by your Doctor.  Diet: Eat a light meal as desired this evening. You may resume your usual diet tomorrow.  Return to Work: You may resume your work activities in one to two days or as indicated by Therapist, sports.  What to expect after your surgery: Expect to have vaginal bleeding/discharge for 2-3 days and spotting for up to 10 days. It is not unusual to have soreness for up to 1-2 weeks. You may have a slight burning sensation when you urinate for the first day. Mild cramps may continue for a couple of days. You may have a regular period in 2-6 weeks.  Call your doctor for any of the following:  Excessive vaginal bleeding or clotting, saturating and changing one pad every hour.  Inability to urinate 6 hours after discharge from hospital.  Pain not relieved by pain medication.  Fever of 100.4 F or greater.  Unusual vaginal discharge or odor.     Post Anesthesia Home Care Instructions  Activity: Get plenty of rest for the remainder of the day. A responsible individual must stay with you for 24  hours following the procedure.  For the next 24 hours, DO NOT: -Drive a car -Advertising copywriter -Drink alcoholic beverages -Take any medication unless instructed by your physician -Make any legal decisions or sign important papers.  Meals: Start with liquid foods such as gelatin or soup. Progress to regular foods as tolerated. Avoid greasy, spicy, heavy foods. If nausea and/or vomiting occur, drink only clear liquids until the nausea and/or vomiting subsides. Call your physician if vomiting continues.  Special Instructions/Symptoms: Your throat may feel dry or sore from the anesthesia or the breathing tube placed in your throat during surgery. If this causes discomfort, gargle with warm salt water. The discomfort should disappear within 24 hours.  No acetaminophen/Tylenol until after 12:45 pm today if needed. No ibuprofen, Advil, Aleve, Motrin, ketorolac, meloxicam, naproxen, or other NSAIDS until after 2:45 pm today if needed.

## 2023-02-13 NOTE — Anesthesia Postprocedure Evaluation (Signed)
Anesthesia Post Note  Patient: Caroline Peterson  Procedure(s) Performed: DILATATION & CURETTAGE/HYSTEROSCOPY WITH NOVASURE ABLATION; MYOSURE RESECTION (Vagina )     Patient location during evaluation: PACU Anesthesia Type: General Level of consciousness: awake and alert, oriented and patient cooperative Pain management: pain level controlled Vital Signs Assessment: post-procedure vital signs reviewed and stable Respiratory status: spontaneous breathing, nonlabored ventilation and respiratory function stable Cardiovascular status: blood pressure returned to baseline and stable Postop Assessment: no apparent nausea or vomiting Anesthetic complications: no   No notable events documented.  Last Vitals:  Vitals:   02/13/23 0930 02/13/23 1000  BP: 138/81 (!) 144/72  Pulse: (!) 58 (!) 58  Resp: 17 14  Temp:  (!) 36.4 C  SpO2: 99% 100%    Last Pain:  Vitals:   02/13/23 1000  TempSrc:   PainSc: 2                  Lannie Fields

## 2023-02-13 NOTE — Op Note (Signed)
02/13/2023  8:54 AM  PATIENT:  Caroline Peterson Forrest Goucher  47 y.o. female  PRE-OPERATIVE DIAGNOSIS:  Menorrhagia , AUB-P  POST-OPERATIVE DIAGNOSIS:  Same plus endocervical polyp  PROCEDURE:  Procedure(s): DILATATION & CURETTAGE HYSTEROSCOPY WITH NOVASURE ABLATION MYOSURE RESECTION OF FUNDAL ENDOMETRIAL POLYP ENDOCERVICAL POLYPECTOMY  SURGEON:  Surgeon(s): Olivia Mackie, MD  ASSISTANTS: none   ANESTHESIA:   local and general  ESTIMATED BLOOD LOSS: MINIMAL, FLUID DEFICIT 10CC   DRAINS: none   LOCAL MEDICATIONS USED:  MARCAINE    and Amount: 20 ml  SPECIMEN:  Source of Specimen:  POLYPS AND EMC  DISPOSITION OF SPECIMEN:  PATHOLOGY  COUNTS:  YES  DICTATION #: [id number]  PLAN OF CARE: DC HOME  PATIENT DISPOSITION:  PACU - hemodynamically stable.

## 2023-02-13 NOTE — Anesthesia Procedure Notes (Signed)
Procedure Name: LMA Insertion Date/Time: 02/13/2023 8:31 AM  Performed by: Cay Kath D, CRNAPre-anesthesia Checklist: Patient identified, Emergency Drugs available, Suction available and Patient being monitored Patient Re-evaluated:Patient Re-evaluated prior to induction Oxygen Delivery Method: Circle system utilized Preoxygenation: Pre-oxygenation with 100% oxygen Induction Type: IV induction Ventilation: Mask ventilation without difficulty LMA: LMA inserted LMA Size: 4.0 Tube type: Oral Number of attempts: 1 Placement Confirmation: positive ETCO2 and breath sounds checked- equal and bilateral Tube secured with: Tape Dental Injury: Teeth and Oropharynx as per pre-operative assessment

## 2023-02-13 NOTE — Transfer of Care (Signed)
Immediate Anesthesia Transfer of Care Note  Patient: Caroline Peterson  Procedure(s) Performed: DILATATION & CURETTAGE/HYSTEROSCOPY WITH NOVASURE ABLATION; MYOSURE RESECTION (Vagina )  Patient Location: PACU  Anesthesia Type:General  Level of Consciousness: awake, alert , and oriented  Airway & Oxygen Therapy: Patient Spontanous Breathing and Patient connected to nasal cannula oxygen  Post-op Assessment: Report given to RN and Post -op Vital signs reviewed and stable  Post vital signs: Reviewed and stable  Last Vitals:  Vitals Value Taken Time  BP 146/78 02/13/23 0905  Temp    Pulse 67 02/13/23 0906  Resp 12 02/13/23 0906  SpO2 100 % 02/13/23 0906  Vitals shown include unvalidated device data.  Last Pain:  Vitals:   02/13/23 0703  TempSrc: Oral  PainSc: 0-No pain      Patients Stated Pain Goal: 3 (02/13/23 0703)  Complications: No notable events documented.

## 2023-02-13 NOTE — Op Note (Signed)
NAMEJELANI, HLAVATY MEDICAL RECORD NO: [id number] ACCOUNT NO: [id number] DATE OF BIRTH: March 17, 1976 FACILITY: [facility] LOCATION: [facility] PHYSICIAN: Lenoard Aden, MD  Operative Report   DATE OF PROCEDURE: 02/13/2023  PREOPERATIVE DIAGNOSES:  Menometrorrhagia with ____ lesion.  POSTOPERATIVE DIAGNOSES:  Endocervical polyp, endometrial polyp.  SURGEON:  Lenoard Aden, MD  ASSISTANT:  None.  ANESTHESIA:  Local, general.  ESTIMATED BLOOD LOSS:  Less than 50 mL.  FLUID DEFICIT: 10 mL.  COMPLICATIONS:  None.  DRAINS:  None.  COUNTS: Correct.  SPECIMEN:  Endometrial curettings, endometrial polyp, endocervical polyp to pathology.  BRIEF OPERATIVE NOTE:  After being apprised of the risks of anesthesia, infection, bleeding, injury to surrounding organs, possible need for repair, delayed versus immediate complications to include bowel and bladder injury with possible need for repair,  the patient was brought to the operating room where she was administered a general anesthetic without complication.  Prepped and draped in usual sterile fashion. Catheterized until the bladder was empty.  At this time, feet are placed in the Yellofin  stirrups without difficulty.  Exam under anesthesia revealed a midposition uterus, no adnexal masses.  Upon visualization of the cervix, endocervical polyp was seen.  This was removed using a ring forceps without difficulty and sent to pathology.  Dilute  Pitressin solution then placed at 3 and 9 o'clock, 18 mL in total standard fashion.  Dilute Marcaine solution used to place paracervical block, 20 mL total in standard fashion.  Cervix easily dilated up to a 21 Pratt dilator.  Hysteroscope placed.   Visualization reveals an endometrial polyp in the right fundal area and otherwise a thickened posterior wall.  The MyoSure LITE device was then entered in the standard fashion and resection of the fundal polyp was done completely and the posterior wall   is resected as well.  At this time, the cavity appears normal.  D and C performed.  Endometrial curettings collected.  Hysteroscope removed and NovaSure device was entered seated to a length of 6 and a width of 4.7, and initiated for 62 seconds at 155  watts.  At this time, device was removed, inspected and found to be intact.  Please note that the CO2 test was negative.  The hysteroscope was replaced and visualization reveals a well ablated endometrial cavity.  No evidence of perforation.  Procedure  was then terminated.  Instruments were removed.  The patient tolerated the procedure well, was awakened and transferred to recovery in good condition.   NIK D: 02/13/2023 9:01:06 am T: 02/13/2023 9:47:00 am  JOB: [id number]/ [id number]

## 2023-02-15 ENCOUNTER — Encounter (HOSPITAL_BASED_OUTPATIENT_CLINIC_OR_DEPARTMENT_OTHER): Payer: Self-pay | Admitting: Obstetrics and Gynecology

## 2023-02-15 LAB — SURGICAL PATHOLOGY

## 2023-02-27 DIAGNOSIS — H04123 Dry eye syndrome of bilateral lacrimal glands: Secondary | ICD-10-CM | POA: Diagnosis not present

## 2023-02-27 DIAGNOSIS — H40033 Anatomical narrow angle, bilateral: Secondary | ICD-10-CM | POA: Diagnosis not present

## 2023-02-28 ENCOUNTER — Other Ambulatory Visit: Payer: Self-pay | Admitting: Cardiology

## 2023-03-02 ENCOUNTER — Other Ambulatory Visit: Payer: Self-pay | Admitting: Family Medicine

## 2023-03-06 ENCOUNTER — Encounter: Payer: Self-pay | Admitting: Family Medicine

## 2023-03-18 ENCOUNTER — Encounter: Payer: Self-pay | Admitting: Family Medicine

## 2023-03-18 ENCOUNTER — Ambulatory Visit (INDEPENDENT_AMBULATORY_CARE_PROVIDER_SITE_OTHER): Payer: BC Managed Care – PPO | Admitting: Family Medicine

## 2023-03-18 VITALS — BP 105/72 | HR 77 | Temp 97.3°F | Ht 63.0 in | Wt 141.2 lb

## 2023-03-18 DIAGNOSIS — F410 Panic disorder [episodic paroxysmal anxiety] without agoraphobia: Secondary | ICD-10-CM

## 2023-03-18 DIAGNOSIS — Z0001 Encounter for general adult medical examination with abnormal findings: Secondary | ICD-10-CM | POA: Diagnosis not present

## 2023-03-18 DIAGNOSIS — Z131 Encounter for screening for diabetes mellitus: Secondary | ICD-10-CM | POA: Diagnosis not present

## 2023-03-18 DIAGNOSIS — Z1159 Encounter for screening for other viral diseases: Secondary | ICD-10-CM

## 2023-03-18 DIAGNOSIS — Z114 Encounter for screening for human immunodeficiency virus [HIV]: Secondary | ICD-10-CM

## 2023-03-18 DIAGNOSIS — W5689XA Other contact with other nonvenomous marine animals, initial encounter: Secondary | ICD-10-CM

## 2023-03-18 DIAGNOSIS — R232 Flushing: Secondary | ICD-10-CM

## 2023-03-18 DIAGNOSIS — Z0000 Encounter for general adult medical examination without abnormal findings: Secondary | ICD-10-CM

## 2023-03-18 DIAGNOSIS — Z789 Other specified health status: Secondary | ICD-10-CM

## 2023-03-18 DIAGNOSIS — E785 Hyperlipidemia, unspecified: Secondary | ICD-10-CM | POA: Diagnosis not present

## 2023-03-18 DIAGNOSIS — I159 Secondary hypertension, unspecified: Secondary | ICD-10-CM

## 2023-03-18 LAB — CBC
HCT: 39.7 % (ref 36.0–46.0)
Hemoglobin: 13.1 g/dL (ref 12.0–15.0)
MCHC: 32.9 g/dL (ref 30.0–36.0)
MCV: 87.4 fl (ref 78.0–100.0)
Platelets: 224 10*3/uL (ref 150.0–400.0)
RBC: 4.54 Mil/uL (ref 3.87–5.11)
RDW: 14.5 % (ref 11.5–15.5)
WBC: 6.9 10*3/uL (ref 4.0–10.5)

## 2023-03-18 LAB — COMPREHENSIVE METABOLIC PANEL
ALT: 17 U/L (ref 0–35)
AST: 16 U/L (ref 0–37)
Albumin: 4 g/dL (ref 3.5–5.2)
Alkaline Phosphatase: 89 U/L (ref 39–117)
BUN: 11 mg/dL (ref 6–23)
CO2: 25 mEq/L (ref 19–32)
Calcium: 8.6 mg/dL (ref 8.4–10.5)
Chloride: 107 mEq/L (ref 96–112)
Creatinine, Ser: 0.67 mg/dL (ref 0.40–1.20)
GFR: 103.58 mL/min (ref >60.00)
Glucose, Bld: 93 mg/dL (ref 70–99)
Potassium: 3.7 mEq/L (ref 3.5–5.1)
Sodium: 141 mEq/L (ref 135–145)
Total Bilirubin: 0.4 mg/dL (ref 0.2–1.2)
Total Protein: 7 g/dL (ref 6.0–8.3)

## 2023-03-18 LAB — LIPID PANEL
Cholesterol: 204 mg/dL — ABNORMAL HIGH (ref 0–200)
HDL: 48.3 mg/dL (ref >39.00)
LDL Cholesterol: 118 mg/dL — ABNORMAL HIGH (ref 0–99)
NonHDL: 155.3
Total CHOL/HDL Ratio: 4
Triglycerides: 187 mg/dL — ABNORMAL HIGH (ref 0.0–149.0)
VLDL: 37.4 mg/dL (ref 0.0–40.0)

## 2023-03-18 LAB — HEMOGLOBIN A1C: Hgb A1c MFr Bld: 5.3 % (ref 4.6–6.5)

## 2023-03-18 LAB — TSH: TSH: 1.24 u[IU]/mL (ref 0.35–5.50)

## 2023-03-18 MED ORDER — MELOXICAM 15 MG PO TABS
15.0000 mg | ORAL_TABLET | Freq: Every day | ORAL | 0 refills | Status: DC
Start: 2023-03-18 — End: 2023-09-24

## 2023-03-18 NOTE — Assessment & Plan Note (Signed)
Check lipids 

## 2023-03-18 NOTE — Patient Instructions (Signed)
It was very nice to see you today!  We will check blood work today.  Please try putting ice on your knee a few times a day.  Also take the meloxicam.  Let me know if not improving in the next 1 to 2 weeks.  Your stingray sting site looks like it is healing normally.  Let us know if this does not continue to improve.  Please continue to work on your diet and exercise.  We will check blood work today.  Please make sure that you are getting plenty of fluids and stay well-hydrated.  We can continue your current dose of BuSpar for now.  Your facial flushing is probably due to hormonal changes related to perimenopause.  Please let us know if this worsens.  Return in about 1 year (around 03/17/2024) for Annual Physical.   Take care, Dr [name]  PLEASE NOTE:  If you had any lab tests, please let us know if you have not heard back within a few days. You may see your results on mychart before we have a chance to review them but we will give you a call once they are reviewed by [place].   If we ordered any referrals today, please let us know if you have not heard from their office within the next week.   If you had any urgent prescriptions sent in today, please check with the pharmacy within an hour of our visit to make sure the prescription was transmitted appropriately.   Please try these tips to maintain a healthy lifestyle:  Eat at least 3 REAL meals and 1-2 snacks per day.  Aim for no more than 5 hours between eating.  If you eat breakfast, please do so within one hour of getting up.   Each meal should contain half fruits/vegetables, one quarter protein, and one quarter carbs (no bigger than a computer mouse)  Cut down on sweet beverages. This includes juice, soda, and sweet tea.   Drink at least 1 glass of water with each meal and aim for at least 8 glasses per day  Exercise at least 150 minutes every week.    Preventive Care 29-45 Years Old, Female Preventive care refers to lifestyle  choices and visits with your health care provider that can promote health and wellness. Preventive care visits are also called wellness exams. What can I expect for my preventive care visit? Counseling Your health care provider may ask you questions about your: Medical history, including: Past medical problems. Family medical history. Pregnancy history. Current health, including: Menstrual cycle. Method of birth control. Emotional well-being. Home life and relationship well-being. Sexual activity and sexual health. Lifestyle, including: Alcohol, nicotine or tobacco, and drug use. Access to firearms. Diet, exercise, and sleep habits. Work and work Astronomer. Sunscreen use. Safety issues such as seatbelt and bike helmet use. Physical exam Your health care provider will check your: Height and weight. These may be used to calculate your BMI (body mass index). BMI is a measurement that tells if you are at a healthy weight. Waist circumference. This measures the distance around your waistline. This measurement also tells if you are at a healthy weight and may help predict your risk of certain diseases, such as type 2 diabetes and high blood pressure. Heart rate and blood pressure. Body temperature. Skin for abnormal spots. What immunizations do I need?  Vaccines are usually given at various ages, according to a schedule. Your health care provider will recommend vaccines for you based on your  age, medical history, and lifestyle or other factors, such as travel or where you work. What tests do I need? Screening Your health care provider may recommend screening tests for certain conditions. This may include: Lipid and cholesterol levels. Diabetes screening. This is done by checking your blood sugar (glucose) after you have not eaten for a while (fasting). Pelvic exam and Pap test. Hepatitis B test. Hepatitis C test. HIV (human immunodeficiency virus) test. STI (sexually transmitted  infection) testing, if you are at risk. Lung cancer screening. Colorectal cancer screening. Mammogram. Talk with your health care provider about when you should start having regular mammograms. This may depend on whether you have a family history of breast cancer. BRCA-related cancer screening. This may be done if you have a family history of breast, ovarian, tubal, or peritoneal cancers. Bone density scan. This is done to screen for osteoporosis. Talk with your health care provider about your test results, treatment options, and if necessary, the need for more tests. Follow these instructions at home: Eating and drinking  Eat a diet that includes fresh fruits and vegetables, whole grains, lean protein, and low-fat dairy products. Take vitamin and mineral supplements as recommended by your health care provider. Do not drink alcohol if: Your health care provider tells you not to drink. You are pregnant, may be pregnant, or are planning to become pregnant. If you drink alcohol: Limit how much you have to 0-1 drink a day. Know how much alcohol is in your drink. In the [place] one drink equals one 12 oz bottle of beer (355 mL), one 5 oz glass of wine (148 mL), or one 1 oz glass of hard liquor (44 mL). Lifestyle Brush your teeth every morning and night with fluoride toothpaste. Floss one time each day. Exercise for at least 30 minutes 5 or more days each week. Do not use any products that contain nicotine or tobacco. These products include cigarettes, chewing tobacco, and vaping devices, such as e-cigarettes. If you need help quitting, ask your health care provider. Do not use drugs. If you are sexually active, practice safe sex. Use a condom or other form of protection to prevent STIs. If you do not wish to become pregnant, use a form of birth control. If you plan to become pregnant, see your health care provider for a prepregnancy visit. Take aspirin only as told by your health care provider.  Make sure that you understand how much to take and what form to take. Work with your health care provider to find out whether it is safe and beneficial for you to take aspirin daily. Find healthy ways to manage stress, such as: Meditation, yoga, or listening to music. Journaling. Talking to a trusted person. Spending time with friends and family. Minimize exposure to UV radiation to reduce your risk of skin cancer. Safety Always wear your seat belt while driving or riding in a vehicle. Do not drive: If you have been drinking alcohol. Do not ride with someone who has been drinking. When you are tired or distracted. While texting. If you have been using any mind-altering substances or drugs. Wear a helmet and other protective equipment during sports activities. If you have firearms in your house, make sure you follow all gun safety procedures. Seek help if you have been physically or sexually abused. What's next? Visit your health care provider once a year for an annual wellness visit. Ask your health care provider how often you should have your eyes and teeth checked. Stay up  to date on all vaccines. This information is not intended to replace advice given to you by your health care provider. Make sure you discuss any questions you have with your health care provider. Document Revised: 01/25/2021 Document Reviewed: 01/25/2021 Elsevier Patient Education  2024 [employer].

## 2023-03-18 NOTE — Assessment & Plan Note (Signed)
Needs to avoid statins going forward due to prior reaction.  She is following with cardiology for lipid management.

## 2023-03-18 NOTE — Assessment & Plan Note (Signed)
Blood pressure at goal on metoprolol succinate 50 mg daily. 

## 2023-03-18 NOTE — Assessment & Plan Note (Signed)
Stable on BuSpar 10 mg twice daily.  We did discuss that her symptoms are probably vasomotor in relation to her perimenopausal state.  Overall her anxiety is stable.  She does have some anxiety around work and home issues however this is currently manageable.  Has Ativan to use as needed however has not used this for a while.  Will continue with current regimen for now.  She can follow-up with [place] in a few weeks.  We can titrate the dose of BuSpar as needed.

## 2023-03-18 NOTE — Progress Notes (Signed)
Chief Complaint:  Caroline Peterson is a 47 y.o. female who presents today for her annual comprehensive physical exam.    Assessment/Plan:  New/Acute Problems: Stingray Sting No red flags.  She has completed her treatment that was prescribed by Emergency Department in [place].  Appears like it is healing normally today.  Anticipate this will not cause her any other issues going forward.  We discussed reasons to return to care or seek emergent care  Right Knee Pain No red flags.  Overall reassuring exam today.  May have contusion or slight bursitis to the area.  Given no trauma we did discuss x-ray however she deferred for now.  Recommended putting ice on the area a few times daily.  Will also start meloxicam.  She will let us know if not improving in the next 1 to 2 weeks and would consider imaging versus referral at that time.  Flushing No red flags.  Discussed with patient her medications are likely not the cause for this.  We did discuss healthy lifestyle measures including making sure she is staying well-hydrated.  Also discussed importance of healthy diet and regular exercise.  Symptoms are likely vasomotor symptoms related to perimenopause.  She can discuss further with her gynecologist.  Hand pain Likely due to resolving phlebitis related to IV site.  Symptoms are improving.  Will continue with watchful waiting.  She will let us know if this does not continue to improve.  Chronic Problems Addressed Today: Dyslipidemia Check lipids.  Hypertension Blood pressure at goal on metoprolol succinate 50 mg daily.  Statin intolerance Needs to avoid statins going forward due to prior reaction.  She is following with cardiology for lipid management.  Panic attacks Stable on BuSpar 10 mg twice daily.  We did discuss that her symptoms are probably vasomotor in relation to her perimenopausal state.  Overall her anxiety is stable.  She does have some anxiety around work and home  issues however this is currently manageable.  Has Ativan to use as needed however has not used this for a while.  Will continue with current regimen for now.  She can follow-up with [place] in a few weeks.  We can titrate the dose of BuSpar as needed.  Preventative Healthcare: Check labs.   Patient Counseling(The following topics were reviewed and/or handout was given):  -Nutrition: Stressed importance of moderation in sodium/caffeine intake, saturated fat and cholesterol, caloric balance, sufficient intake of fresh fruits, vegetables, and fiber.  -Stressed the importance of regular exercise.   -Substance Abuse: Discussed cessation/primary prevention of tobacco, alcohol, or other drug use; driving or other dangerous activities under the influence; availability of treatment for abuse.   -Injury prevention: Discussed safety belts, safety helmets, smoke detector, smoking near bedding or upholstery.   -Sexuality: Discussed sexually transmitted diseases, partner selection, use of condoms, avoidance of unintended pregnancy and contraceptive alternatives.   -Dental health: Discussed importance of regular tooth brushing, flossing, and dental visits.  -Health maintenance and immunizations reviewed. Please refer to Health maintenance section.  Return to care in 1 year for next preventative visit.     Subjective:  HPI:  See Assessment / plan for status of chronic conditions.   She was stung by a sting ray while in [place] a couple of weeks ago. Went to a local Ed and was treated with hydrocodone, prednisone, benedryl, Zofran, and doxycycline. She has finished her course of antibiotics. Symptoms are improving.   She did also fall about 6 weeks ago. Landed  on her right knee. Still some pain to the area. Worse with pressure on the area.   She also has intermittent issues with flushing and lightheadedness.  This occurs randomly.  She is concerned this may be a side effect of the BuSpar.  She is currently  taking 10 mg twice daily.  She did see her gynecologist recently and had hormone testing which was reportedly normal.  She has also had some pain on her left hand since her surgery about a month ago.  Had pain at site of IV placement.  This has improved over the last couple of weeks.  Sometimes painful on palpation.  Lifestyle Diet: None specific.  Exercise: None specific.      03/18/2023    8:23 AM  Depression screen PHQ 2/9  Decreased Interest 0  Down, Depressed, Hopeless 0  PHQ - 2 Score 0    Health Maintenance Due  Topic Date Due   HIV Screening  Never done   Hepatitis C Screening  Never done    ROS: Per HPI, otherwise a complete review of systems was negative.   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 Jhonnie Garner;  s/p yag laser peripheral iridotomy left eye 2018;  bilateral eye 2019   Arthritis    neck   Complication of anesthesia    per pt had issues post op in PACU with excessive sleepiness and being sedated following sinus surgery , nurse had to to jaw thrust to maintain airway.  anesthesia record in care everywhere date of surgery 08-14-2022 @ [facility]  (in epic pt spoke with one of our anesthesiologist, Dr A. Hodierne MDA, note dated 12-28-2022)   Dry eye syndrome of both eyes    Family history of adverse reaction to anesthesia    father--- hard to wake   GERD (gastroesophageal reflux disease)    01-25-2023 per pt does not take any meds   History of hemolysis, elevated liver enzymes, and low platelet (HELLP) syndrome    History of MRSA infection    boils on skin (from shaving)   Hypertension    IDA (iron deficiency anemia)    Intracranial atherosclerosis    followed by pcp;  incidental finding on CT/ MRI   Menorrhagia    Multiple thyroid nodules    incidental finding on head CT 2023/  ultrasound 05-11-2022 in [facility],   bilateral nodules , no bx , did not meet criteria   Nonrheumatic mitral (valve) insufficiency    Palpitations    cardiologist--- dr [place] Lions   Wears glasses    Patient Active Problem List   Diagnosis Date Noted   Statin intolerance 11/14/2022   Dysmenorrhea 05/31/2022   Intracranial atherosclerosis 05/09/2022   Iron deficiency 05/09/2022   Adjustment disorder 03/14/2022   Dyslipidemia 02/28/2021   Other fatigue 10/05/2020   Hearing loss 09/19/2020   Lipoma 06/10/2020   Allergic rhinitis 11/09/2019   Insomnia 11/09/2019   Cold sore 11/09/2019   Osteoarthritis 11/09/2019   Eye abnormality 11/09/2019   Panic attacks 04/09/2019   Palpitation 02/17/2013   Hypertension 11/22/2010   Past Surgical History:  Procedure Laterality Date   CESAREAN SECTION  11/06/2009   per pt w/ general anesthesia   COLONOSCOPY WITH ESOPHAGOGASTRODUODENOSCOPY (EGD)  06/19/2022   dr beavers   DILATION AND CURETTAGE OF UTERUS  09/06/2008   @ [facility];  for POC   DILITATION & CURRETTAGE/HYSTROSCOPY WITH NOVASURE ABLATION N/A 02/13/2023   Procedure: DILATATION & CURETTAGE/HYSTEROSCOPY WITH NOVASURE ABLATION; MYOSURE RESECTION;  Surgeon: Olivia Mackie, MD;  Location: [facility];  Service: Gynecology;  Laterality: N/A;   NASAL/SINUS ENDOSCOPY  08/14/2022   @ [facility] by dr d. Hollace Hayward;  bilateral resection subucous turbinates,  septoplasty,  right maxillary antrostomy w/ removal tissue   TONSILLECTOMY AND ADENOIDECTOMY  1986   WISDOM TOOTH EXTRACTION  1994   per pt w/ anesthesia    Family History  Problem Relation Age of Onset   Hypertension Mother    Hyperlipidemia Mother    Diabetes Father    Hyperlipidemia Father    Hypertension Father    Kidney disease Father    Hypertension Brother    Cancer Maternal Grandmother    Cancer Maternal Grandfather    Cancer Paternal Grandmother    Heart disease Paternal Grandmother    Heart disease Paternal Grandfather    Hypertension  Paternal Grandfather    Colon cancer Neg Hx    Esophageal cancer Neg Hx    Liver cancer Neg Hx    Pancreatic cancer Neg Hx    Rectal cancer Neg Hx    Stomach cancer Neg Hx     Medications- reviewed and updated Current Outpatient Medications  Medication Sig Dispense Refill   azelastine (ASTELIN) 0.1 % nasal spray Place 2 sprays into both nostrils 2 (two) times daily. (Patient taking differently: Place 2 sprays into both nostrils 2 (two) times daily as needed for allergies or rhinitis.) 30 mL 12   busPIRone (BUSPAR) 10 MG tablet TAKE 1 TABLET BY MOUTH EVERY MORNING AND 2 BY MOUTH EVERY NIGHT AT BEDTIME 90 tablet 2   calcium citrate (CALCITRATE - DOSED IN MG ELEMENTAL CALCIUM) 950 (200 Ca) MG tablet Take 2 tablets by mouth daily with lunch.     Coenzyme Q10-Omega 3 Fatty Acd (COQMAX OMEGA) 50 MG CAPS Take 2 capsules by mouth daily with lunch.     COLLAGEN PO Take by mouth daily.     ferrous gluconate (FERGON) 324 MG tablet Take 324 mg by mouth every other day.     Glycerin-Hypromellose-PEG 400 (DRY EYE RELIEF DROPS OP) Place 1 drop into both eyes 2 (two) times daily.     hydrocortisone (ANUSOL-HC) 2.5 % rectal cream APPLY RECTALLY TO THE AFFECTED AREA THREE TIMES DAILY (Patient taking differently: Place 1 Application rectally 3 (three) times daily as needed for hemorrhoids.) 30 g 1   LORazepam (ATIVAN) 0.5 MG tablet TAKE 1/2 TABLET(0.25 MG) BY MOUTH DAILY AS NEEDED FOR ANXIETY (Patient taking differently: Take 0.5 mg by mouth as needed for anxiety.) 30 tablet 5   meloxicam (MOBIC) 15 MG tablet Take 1 tablet (15 mg total) by mouth daily. 30 tablet 0   metoprolol succinate (TOPROL-XL) 50 MG 24 hr tablet TAKE 1 TABLET BY MOUTH TWICE DAILY WITH OR IMMEDIATELY FOLLOWING A MEAL 180 tablet 2   Multiple Vitamin (MULTIVITAMIN) tablet Take 1 tablet by mouth daily with lunch.     traZODone (DESYREL) 50 MG tablet TAKE 1/2 TO 1 TABLET(25 TO 50 MG) BY MOUTH AT BEDTIME AS NEEDED FOR SLEEP 30 tablet 3    valACYclovir (VALTREX) 500 MG tablet Take 1 tablet (500 mg total) by mouth daily. (Patient taking differently: Take 500 mg by mouth at bedtime.) 90 tablet 3   No current facility-administered medications for this visit.    Allergies-reviewed and updated Allergies  Allergen Reactions   Aspirin  Nausea And Vomiting    "can take coated aspirin"   Augmentin [Amoxicillin-Pot Clavulanate] Diarrhea   Erythromycin Nausea And Vomiting   Levaquin [Levofloxacin] Other (See Comments)    Trigger thumb; arthralgias   Nitrofurantoin Nausea And Vomiting   Statins Other (See Comments)    Transient muscle weakness/ myalgias   Azithromycin Nausea And Vomiting, Palpitations and Other (See Comments)    "made my heart speed up"    Social History   Socioeconomic History   Marital status: Divorced    Spouse name: Valiyah Felipe   Number of children: 1   Years of education: Not on file   Highest education level: Master's degree (e.g., MA, MS, MEng, MEd, MSW, MBA)  Occupational History    Employer: [employer]  Tobacco Use   Smoking status: Never   Smokeless tobacco: Never  Vaping Use   Vaping status: Never Used  Substance and Sexual Activity   Alcohol use: Yes    Comment: occasional   Drug use: Never   Sexual activity: Yes    Birth control/protection: None  Other Topics Concern   Not on file  Social History Narrative   Lives with husband and daughter.    Social Determinants of Health   Financial Resource Strain: Low Risk  (11/10/2022)   Overall Financial Resource Strain (CARDIA)    Difficulty of Paying Living Expenses: Not hard at all  Food Insecurity: No Food Insecurity (11/10/2022)   Hunger Vital Sign    Worried About Running Out of Food in the Last Year: Never true    Ran Out of Food in the Last Year: Never true  Transportation Needs: No Transportation Needs (11/10/2022)   PRAPARE - Administrator, Civil Service (Medical): No    Lack of Transportation  (Non-Medical): No  Physical Activity: Unknown (11/10/2022)   Exercise Vital Sign    Days of Exercise per Week: 0 days    Minutes of Exercise per Session: Not on file  Stress: No Stress Concern Present (11/10/2022)   [employer] of Occupational Health - Occupational Stress Questionnaire    Feeling of Stress : Not at all  Social Connections: Moderately Integrated (11/10/2022)   Social Connection and Isolation Panel [NHANES]    Frequency of Communication with Friends and Family: More than three times a week    Frequency of Social Gatherings with Friends and Family: More than three times a week    Attends Religious Services: More than 4 times per year    Active Member of [employer] or Organizations: Yes    Attends Engineer, structural: More than 4 times per year    Marital Status: Separated        Objective:  Physical Exam: BP 105/72   Pulse 77   Temp (!) 97.3 F (36.3 C) (Temporal)   Ht 5\' 3"  (1.6 m)   Wt 141 lb 3.2 oz (64 kg)   LMP 03/12/2023   SpO2 98%   BMI 25.01 kg/m   Body mass index is 25.01 kg/m. Wt Readings from Last 3 Encounters:  03/18/23 141 lb 3.2 oz (64 kg)  02/13/23 143 lb (64.9 kg)  11/16/22 143 lb 3.2 oz (65 kg)   Gen: NAD, resting comfortably HEENT: TMs normal bilaterally. OP clear. No thyromegaly noted.  CV: RRR with no murmurs appreciated Pulm: NWOB, CTAB with no crackles, wheezes, or rhonchi GI: Normal bowel sounds present. Soft, Nontender, Nondistended. MSK: no edema, cyanosis, or clubbing noted - Knee: Tenderness palpation along right  lateral joint line.  Stable to varus and valgus stress.  Anterior and posterior drawer signs negative.  Neurovascular intact distally Skin: warm, dry.  Approximately 8 mm puncture wound on left lateral ankle noted.  No surrounding erythema.  No pain.  No drainage. Neuro: CN2-12 grossly intact. Strength 5/5 in upper and lower extremities. Reflexes symmetric and intact bilaterally.  Psych: Normal affect and thought  content     Kelaiah Escalona M. [name], MD 03/18/2023 8:55 AM

## 2023-03-20 ENCOUNTER — Other Ambulatory Visit: Payer: Self-pay | Admitting: Family Medicine

## 2023-03-20 NOTE — Progress Notes (Signed)
Cholesterol mildly elevated but better than last year.  The rest of her labs are all stable.  Do not need to make any changes to treatment plan.  She should continue to work on diet and exercise and we can recheck everything in a year.

## 2023-03-21 ENCOUNTER — Encounter: Payer: Self-pay | Admitting: Family Medicine

## 2023-03-21 NOTE — Telephone Encounter (Signed)
Please advise 

## 2023-03-25 NOTE — Telephone Encounter (Signed)
Calcium is good for her bone health but it is ok for to hold off on this for now.  Ok for her to stop iron and we can recheck in 6- 12 months.  Katina Degree. [name], MD 03/25/2023 7:13 AM

## 2023-05-16 ENCOUNTER — Telehealth: Payer: Self-pay | Admitting: Neurology

## 2023-05-16 ENCOUNTER — Other Ambulatory Visit: Payer: Self-pay

## 2023-05-16 DIAGNOSIS — R531 Weakness: Secondary | ICD-10-CM

## 2023-05-16 NOTE — Telephone Encounter (Signed)
Patient unable to come for labs today, she will be coming on 10/7 and 10/11 for appoirnmtent with Dr. Terrace Arabia. She is aware that she is being worked in and may have longer wait time.

## 2023-05-16 NOTE — Telephone Encounter (Signed)
Returned call to patient, no answer. Left message to return call to office.

## 2023-05-16 NOTE — Telephone Encounter (Signed)
Pt called wanting to know if she can speak to the RN regarding muscle weakness she is having in her eye that is now drooping.

## 2023-05-16 NOTE — Telephone Encounter (Signed)
Patient called back and reports continued weakness and has concerns with left drooping eye. It started back in July and then got better and now is worse. She reports it is worse when driving. She went to the eye Dr. 2 weeks ago and they asked her to follow up with neurology for suspected MG. Advised I would send to Dr. Terrace Arabia and  I have requested her visit notes from ophthalmology.

## 2023-05-16 NOTE — Telephone Encounter (Signed)
Previous lab screening for myasthenia gravis test was negative,  I have reentered the order, she may come in for the lab without appointment today, then put her on my schedule at the end of next week, by then the lab result will come back  Orders Placed This Encounter  Procedures   AChR All with Reflex to [facility]   CK

## 2023-05-24 ENCOUNTER — Ambulatory Visit: Payer: BC Managed Care – PPO | Admitting: Neurology

## 2023-05-24 VITALS — BP 126/82 | HR 64 | Ht 64.0 in | Wt 141.0 lb

## 2023-05-24 DIAGNOSIS — G7000 Myasthenia gravis without (acute) exacerbation: Secondary | ICD-10-CM | POA: Diagnosis not present

## 2023-05-24 LAB — CK: Total CK: 78 U/L (ref 32–182)

## 2023-05-24 LAB — ACHR ALL WITH REFLEX TO MUSK
AChR Binding Ab, Serum: 6.71 nmol/L — ABNORMAL HIGH (ref 0.00–0.24)
AChR-modulating Ab: 76 % — ABNORMAL HIGH (ref 0–45)
Acetylchol Block Ab: 64 % — ABNORMAL HIGH (ref 0–25)

## 2023-05-24 MED ORDER — MYCOPHENOLATE MOFETIL 500 MG PO TABS: 1000.0000 mg | ORAL_TABLET | Freq: Two times a day (BID) | ORAL | 11 refills | Status: DC

## 2023-05-24 MED ORDER — PREDNISONE 10 MG PO TABS
40.0000 mg | ORAL_TABLET | Freq: Every day | ORAL | 11 refills | Status: DC
Start: 2023-05-24 — End: 2024-03-18

## 2023-05-24 MED ORDER — MYCOPHENOLATE MOFETIL 500 MG PO TABS
500.0000 mg | ORAL_TABLET | Freq: Two times a day (BID) | ORAL | 11 refills | Status: DC
Start: 2023-05-24 — End: 2023-11-25

## 2023-05-24 MED ORDER — PYRIDOSTIGMINE BROMIDE 60 MG PO TABS: 60.0000 mg | ORAL_TABLET | Freq: Three times a day (TID) | ORAL | 11 refills | Status: DC

## 2023-05-24 NOTE — Progress Notes (Signed)
Chief Complaint  Patient presents with   Follow-up    Rm 14 alone Pt is well, here to discuss eye drooping and lab work. She reports her eye started drooping in July. It does not do it consistently, it is worse when she is driving.       ASSESSMENT AND PLAN  Caroline Peterson is a 47 y.o. female    Seropositive generalized myasthenia gravis  Positive acetylcholine receptor antibodies,  Evidence of fatigable left ptosis, mild bulbar, neck flexion, mild to moderate limb muscle weakness,  CT chest to rule out thymus pathology  Starting prednisone 40 mg, 10 mg decrement every 2 weeks,  She complains of side effect with prednisone treatment, we will go ahead start CellCept 500 mg twice daily as steroid sparing agent  Mestinon 60 mg 3 times a day as needed  Has follow-up scheduled for November 6   DIAGNOSTIC DATA (LABS, IMAGING, TESTING) - I reviewed patient records, labs, notes, testing and imaging myself where available.   MEDICAL HISTORY:  Caroline Peterson, is a 47 year old female seen in request by her primary care doctor [name] for evaluation of weakness, initial evaluation was on October 04, 2020  I reviewed and summarized the referring note. PMHX. Anxiety Iron Deficiency anemia HTN HLD, could not tolerate statin,   Chronic insomnia.  She works as a Facilities manager, was taken to the emergency room by her mother on September 17, 2022, that morning she was ready to work, having backpack on her right shoulder, holding her cell phone, lunch bag, coffee cup by her left hand as she usually does, then she felt left arm weakness to the point of dropping her coffee, she has to squatting down to put off her load, then fell backwards, landed on the hard floor,  She has been complains of generalized weakness since her statin treatment in September 2023, initially was given Lipitor up to 40 mg, by December 2023, she has developed such weakness  to the point of difficulty raising arm overhead to shampoo her hair, but denies significant muscle achy pain, Lipitor were stopped, her symptoms gradually improved  Then since August 28, 2022, she was given Zocor 40 mg, few weeks into that, she noticed recurrent bilateral upper extremity weakness, difficulty raising overhead but not to the previous degree  In addition, she complains of chronic neck pain, radiating pain to her right shoulder, right arm seems to be weaker than the left  Personally reviewed MRI of cervical spine September 17, 2022, degenerative spondylosis C5-6, with endplate osteophyte, towards the right, narrowing of the ventral subarachnoid space, but no compression of the cord, foraminal narrowing on the right, because of the encroachment that osteophyte, bulging disc material,  MRI of the brain, no significant abnormality  Laboratory evaluation in February 2024 showed normal magnesium CPK, CBC showed anemia 11.8, due to heavy menstrual bleeding, normal TSH, BMP showed low calcium 7.9  She has stopped the Zocor 40 mg since September 17, 2022, noticed some improvement of her weakness since  UPDATE May 24 2023: Over the past few weeks she noticed intermittent ptosis, sometimes to the point of blocking left vision, denies double vision, feeling fatigue, denies chewing or swallowing difficulty  But on today's examination, she was noted to have fatigable left ptosis, mild bulbar, mild to moderate limb muscle weakness,  Acetylcholine receptor antibody was positive, binding 6.71, blocking antibody 64%, modulating antibody 76%, Confirm the diagnosis seropositive generalized myasthenia gravis  Rest of  the laboratory evaluation showed normal or negative HIV, CPK, hepatitis panel, TSH, CMP, CBC, A1c, mild elevation of LDL 118, triglyceride 187  She is tearful at today's visit, just went through divorce, started a new job, under a lot of stress, suffered a stingray injury in summer, was  treated with steroid, complains of side effect of bloating, flush with steroid  PHYSICAL EXAM:   Vitals:   05/24/23 0826  BP: 126/82  Pulse: 64  Weight: 141 lb (64 kg)  Height: 5\' 4"  (1.626 m)   Not recorded     Body mass index is 24.2 kg/m.  PHYSICAL EXAMNIATION:  Gen: NAD, conversant, well nourised, well groomed                     Cardiovascular: Regular rate rhythm, no peripheral edema, warm, nontender. Eyes: Conjunctivae clear without exudates or hemorrhage Neck: Supple, no carotid bruits. Pulmonary: Clear to auscultation bilaterally   NEUROLOGICAL EXAM:  MENTAL STATUS: Speech/cognition: Awake, alert, oriented to history taking and casual conversation CRANIAL NERVES: CN II: Visual fields are full to confrontation. Pupils are round equal and briskly reactive to light. CN III, IV, VI: extraocular movement are full, cover and uncover demonstrate bilateral exophoria.  Fatigable left ptosis, to the upper edge of left pupil CN V: Facial sensation is intact to light touch CN VII: Mild eye closure, cheek puff weakness CN VIII: Hearing is normal to causal conversation. CN IX, X: Phonation is normal. CN XI: Head turning and shoulder shrug are intact  MOTOR: Mild neck flexion, mild to moderate bilateral bilateral shoulder abduction, elbow flexion, extension weakness, bilateral hip flexion weakness,  REFLEXES: Reflexes are 1  and symmetric at the biceps, triceps, knees, and ankles. Plantar responses are flexor.  SENSORY: Intact to light touch, pinprick and vibratory sensation are intact in fingers and toes.  COORDINATION: There is no trunk or limb dysmetria noted.  GAIT/STANCE: Able to get up from sit down, squatting down position without assistant, steady gait  REVIEW OF SYSTEMS:  Full 14 system review of systems performed and notable only for as above All other review of systems were negative.   ALLERGIES: Allergies  Allergen Reactions   Aspirin Nausea And  Vomiting    "can take coated aspirin"   Augmentin [Amoxicillin-Pot Clavulanate] Diarrhea   Erythromycin Nausea And Vomiting   Levaquin [Levofloxacin] Other (See Comments)    Trigger thumb; arthralgias   Nitrofurantoin Nausea And Vomiting   Statins Other (See Comments)    Transient muscle weakness/ myalgias   Azithromycin Nausea And Vomiting, Palpitations and Other (See Comments)    "made my heart speed up"    HOME MEDICATIONS: Current Outpatient Medications  Medication Sig Dispense Refill   azelastine (ASTELIN) 0.1 % nasal spray Place 2 sprays into both nostrils 2 (two) times daily. (Patient taking differently: Place 2 sprays into both nostrils 2 (two) times daily as needed for allergies or rhinitis.) 30 mL 12   busPIRone (BUSPAR) 10 MG tablet TAKE 1 TABLET BY MOUTH EVERY MORNING AND 2 BY MOUTH EVERY NIGHT AT BEDTIME 90 tablet 2   calcium citrate (CALCITRATE - DOSED IN MG ELEMENTAL CALCIUM) 950 (200 Ca) MG tablet Take 2 tablets by mouth daily with lunch.     Coenzyme Q10-Omega 3 Fatty Acd (COQMAX OMEGA) 50 MG CAPS Take 2 capsules by mouth daily with lunch.     COLLAGEN PO Take by mouth daily.     Glycerin-Hypromellose-PEG 400 (DRY EYE RELIEF DROPS OP) Place 1 drop  into both eyes 2 (two) times daily.     hydrocortisone (ANUSOL-HC) 2.5 % rectal cream APPLY RECTALLY TO THE AFFECTED AREA THREE TIMES DAILY (Patient taking differently: Place 1 Application rectally 3 (three) times daily as needed for hemorrhoids.) 30 g 1   LORazepam (ATIVAN) 0.5 MG tablet TAKE 1/2 TABLET(0.25 MG) BY MOUTH DAILY AS NEEDED FOR ANXIETY (Patient taking differently: Take 0.5 mg by mouth as needed for anxiety.) 30 tablet 5   meloxicam (MOBIC) 15 MG tablet Take 1 tablet (15 mg total) by mouth daily. 30 tablet 0   metoprolol succinate (TOPROL-XL) 50 MG 24 hr tablet TAKE 1 TABLET BY MOUTH TWICE DAILY WITH OR IMMEDIATELY FOLLOWING A MEAL 180 tablet 2   Multiple Vitamin (MULTIVITAMIN) tablet Take 1 tablet by mouth daily with  lunch.     traZODone (DESYREL) 50 MG tablet TAKE 1/2 TO 1 TABLET(25 TO 50 MG) BY MOUTH AT BEDTIME AS NEEDED FOR SLEEP 30 tablet 3   valACYclovir (VALTREX) 500 MG tablet TAKE 1 TABLET(500 MG) BY MOUTH DAILY 90 tablet 3   No current facility-administered medications for this visit.    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 Jhonnie Garner;  s/p yag laser peripheral iridotomy left eye 2018;  bilateral eye 2019   Arthritis    neck   Complication of anesthesia    per pt had issues post op in PACU with excessive sleepiness and being sedated following sinus surgery , nurse had to to jaw thrust to maintain airway.  anesthesia record in care everywhere date of surgery 08-14-2022 @ [facility]  (in epic pt spoke with one of our anesthesiologist, Dr A. Hodierne MDA, note dated 12-28-2022)   Dry eye syndrome of both eyes    Family history of adverse reaction to anesthesia    father--- hard to wake   GERD (gastroesophageal reflux disease)    01-25-2023 per pt does not take any meds   History of hemolysis, elevated liver enzymes, and low platelet (HELLP) syndrome    History of MRSA infection    boils on skin (from shaving)   Hypertension    IDA (iron deficiency anemia)    Intracranial atherosclerosis    followed by pcp;  incidental finding on CT/ MRI   Menorrhagia    Multiple thyroid nodules    incidental finding on head CT 2023/  ultrasound 05-11-2022 in [facility],  bilateral nodules , no bx , did not meet criteria   Nonrheumatic mitral (valve) insufficiency    Palpitations    cardiologist--- dr [place] Lions   Wears glasses     PAST SURGICAL HISTORY: Past Surgical History:  Procedure Laterality Date   CESAREAN SECTION  11/06/2009   per pt w/ general anesthesia   COLONOSCOPY WITH ESOPHAGOGASTRODUODENOSCOPY (EGD)  06/19/2022   dr beavers   DILATION AND CURETTAGE OF UTERUS  09/06/2008   @ [facility];   for  POC   DILITATION & CURRETTAGE/HYSTROSCOPY WITH NOVASURE ABLATION N/A 02/13/2023   Procedure: DILATATION & CURETTAGE/HYSTEROSCOPY WITH NOVASURE ABLATION; MYOSURE RESECTION;  Surgeon: Olivia Mackie, MD;  Location: [facility];  Service: Gynecology;  Laterality: N/A;   NASAL/SINUS ENDOSCOPY  08/14/2022   @ [facility] by dr d. Hollace Hayward;  bilateral resection subucous turbinates,  septoplasty,  right maxillary antrostomy w/ removal tissue   TONSILLECTOMY AND ADENOIDECTOMY  1986   WISDOM TOOTH EXTRACTION  1994   per pt w/ anesthesia  FAMILY HISTORY: Family History  Problem Relation Age of Onset   Hypertension Mother    Hyperlipidemia Mother    Diabetes Father    Hyperlipidemia Father    Hypertension Father    Kidney disease Father    Hypertension Brother    Cancer Maternal Grandmother    Cancer Maternal Grandfather    Cancer Paternal Grandmother    Heart disease Paternal Grandmother    Heart disease Paternal Grandfather    Hypertension Paternal Grandfather    Colon cancer Neg Hx    Esophageal cancer Neg Hx    Liver cancer Neg Hx    Pancreatic cancer Neg Hx    Rectal cancer Neg Hx    Stomach cancer Neg Hx     SOCIAL HISTORY: Social History   Socioeconomic History   Marital status: Divorced    Spouse name: Quintavia Rogstad   Number of children: 1   Years of education: Not on file   Highest education level: Master's degree (e.g., MA, MS, MEng, MEd, MSW, MBA)  Occupational History    Employer: [employer]  Tobacco Use   Smoking status: Never   Smokeless tobacco: Never  Vaping Use   Vaping status: Never Used  Substance and Sexual Activity   Alcohol use: Yes    Comment: occasional   Drug use: Never   Sexual activity: Yes    Birth control/protection: None  Other Topics Concern   Not on file  Social History Narrative   Lives with husband and daughter.    Social Determinants of Health   Financial Resource Strain: Low Risk  (11/10/2022)    Overall Financial Resource Strain (CARDIA)    Difficulty of Paying Living Expenses: Not hard at all  Food Insecurity: Low Risk  (03/29/2023)   Received from [facility]   Hunger Vital Sign    Worried About Running Out of Food in the Last Year: Never true    Ran Out of Food in the Last Year: Never true  Transportation Needs: Not on file (03/29/2023)  Physical Activity: Unknown (11/10/2022)   Exercise Vital Sign    Days of Exercise per Week: 0 days    Minutes of Exercise per Session: Not on file  Stress: No Stress Concern Present (11/10/2022)   [employer] of Occupational Health - Occupational Stress Questionnaire    Feeling of Stress : Not at all  Social Connections: Moderately Integrated (11/10/2022)   Social Connection and Isolation Panel [NHANES]    Frequency of Communication with Friends and Family: More than three times a week    Frequency of Social Gatherings with Friends and Family: More than three times a week    Attends Religious Services: More than 4 times per year    Active Member of [employer] or Organizations: Yes    Attends Banker Meetings: More than 4 times per year    Marital Status: Separated  Intimate Partner Violence: Not on file      Levert Feinstein, M.D. Ph.D.  [facility] [address] [place], [place] [address] Ph: [phone] Fax: [phone]  CC:  Ardith Dark, MD [address] [place],  [place] [address]  Ardith Dark, MD   Total time spent reviewing the chart, obtaining history, examined patient, ordering tests, documentation, consultations and family, care coordination was  50 minutes

## 2023-05-24 NOTE — Patient Instructions (Addendum)
[email or web address]  Meds ordered this encounter  Medications  3 pyridostigmine (MESTINON) 60 MG tablet as needed    Sig: Take 1 tablet (60 mg total) by mouth 3 (three) times daily.    Dispense:  90 tablet    Refill:  11  1 predniSONE (DELTASONE) 10 MG tablet    Sig: Take 4 tablets (40 mg total) by mouth daily with breakfast.    Dispense:  120 tablet    Refill:  11  2 mycophenolate (CELLCEPT) 500 MG tablet---after meal    Sig: Take 1tablets (500mg  total) by mouth 2 (two) times daily.    Dispense:  60 tablet    Refill:  11     Prednisone 10mg   4 tabs x2 weeks 3 tabs x2 weeks

## 2023-05-25 ENCOUNTER — Encounter: Payer: Self-pay | Admitting: Neurology

## 2023-05-28 ENCOUNTER — Telehealth: Payer: Self-pay | Admitting: Neurology

## 2023-05-28 NOTE — Telephone Encounter (Signed)
[employer] Numbers: [id number] exp. 05/28/23-06/26/23, [employer] federal basic plan NPR sent to [facility] [phone]

## 2023-05-30 ENCOUNTER — Telehealth: Payer: Self-pay | Admitting: Neurology

## 2023-05-30 NOTE — Telephone Encounter (Signed)
Verbal discussion with Dr. Terrace Arabia and she advised to decrease to 1/2 tablet Mestinon 3x daily and if no improvement then stop and can discuss at upcoming appointment in November. Call to patient at work as requested, no answer and left detailed messaged and also advised I would send a mychart message.

## 2023-05-30 NOTE — Telephone Encounter (Signed)
Pt said having a side effect of diarrhea from  pyridostigmine (MESTINON) 60 MG tablet. The medication has lactose in it. I am lactose intolerant, can medication be converted to a liquid that does not have lactose in it? Please call me back on my work phone: [phone] due to cellphone does not work good at my job.

## 2023-06-02 ENCOUNTER — Telehealth: Payer: Self-pay | Admitting: Neurology

## 2023-06-02 NOTE — Telephone Encounter (Signed)
Patient called after-hours call center regarding a question about her CellCept.  Patient reports that her daughter was exposed to someone with strep throat.  Daughter seems to have a sore throat, she will have her checked out tomorrow.  Patient has no symptoms and was wondering if she should continue with her CellCept.  She is also on prednisone.  She was advised to use a mask wherever possible.  She was advised to continue with her current medication regimen unless Dr. Terrace Arabia wants to change the course.  She was advised to get medical attention if she does develop any symptoms.  Patient demonstrated understanding and agreement. Dr. Terrace Arabia, please review and make additional recommendations as necessary.

## 2023-06-03 NOTE — Telephone Encounter (Signed)
Agree above, she has follow up pending on Nov 6th

## 2023-06-06 ENCOUNTER — Ambulatory Visit
Admission: RE | Admit: 2023-06-06 | Discharge: 2023-06-06 | Disposition: A | Discharge status: Home / Self Care | Payer: BC Managed Care – PPO | Source: Ambulatory Visit | Attending: Neurology | Admitting: Neurology

## 2023-06-06 DIAGNOSIS — G7000 Myasthenia gravis without (acute) exacerbation: Secondary | ICD-10-CM

## 2023-06-19 ENCOUNTER — Ambulatory Visit (INDEPENDENT_AMBULATORY_CARE_PROVIDER_SITE_OTHER): Payer: BC Managed Care – PPO | Admitting: Neurology

## 2023-06-19 ENCOUNTER — Encounter: Payer: Self-pay | Admitting: Neurology

## 2023-06-19 VITALS — BP 129/81 | HR 58 | Ht 64.0 in | Wt 142.0 lb

## 2023-06-19 DIAGNOSIS — G7000 Myasthenia gravis without (acute) exacerbation: Secondary | ICD-10-CM

## 2023-06-19 NOTE — Patient Instructions (Addendum)
[email or web address]  Drugs to avoid or use with caution in MG* Many different drugs have been associated with worsening myasthenia gravis (MG). However, these drug associations do not necessarily mean that a patient with MG should not be prescribed these medications. In many instances, reports of worsening MG are very rare.  In some instances, there may only be a "chance" association (i.e. not causal).  In addition, some of these drugs may be necessary for a patient's treatment and should not be deemed "off limits." It is advisable that patients and physicians recognize and discuss the possibility that a particular drug might worsen the patient's MG. They should also consider, when appropriate, the pros and cons of an alternate treatment, if available.  It is important that the patient notify his or her physicians if MG symptoms worsen after starting any new medication. Only the more common prescription drugs with the strongest evidence suggesting an association with worsening MG are provided in the list below.  Telithromycin: Antibiotic for community acquired pneumonia. The [place] FDA has designated a "black box" warning for this drug in MG. Should not be used in MG. Fluoroquinolones (e.g., ciprofloxacin, moxifloxacin and levofloxacin): Commonly prescribed broadspectrum antibiotics that are associated with worsening MG. The [place] FDA has designated a "black box" warning for these agents in MG. Use cautiously, if at all. Botulinum toxin: Avoid. D-penicillamine: Used for Wilson disease and rarely for rheumatoid arthritis. Strongly associated with causing MG. Avoid. Quinine: Occasionally used for leg cramps. Use prohibited except in malaria in [place]. Magnesium: Potentially dangerous if given intravenously, i.e. for eclampsia during late pregnancy or for hypomagnesemia. Use only if absolutely necessary and observe for worsening. Macrolide antibiotics  (e.g., erythromycin, azithromycin, clarithromycin): Commonly prescribed antibiotics for gram-positive bacterial infections. May worsen MG. Use cautiously, if at all. Aminoglycoside antibiotics (e.g., gentamycin, neomycin, tobramycin): Used for gram-negative bacterial infections. May worsen MG. Use cautiously if no alternative treatment available. Corticosteroids: A standard treatment for MG, but may cause transient worsening within the first two weeks. Monitor carefully for this possibility. Procainamide: Used for irregular heart rhythm. May worsen MG. Use with caution. Desferrioxamine: Chelating agent used for hemochromatosis. May worsen MG. Beta-blockers: Commonly prescribed for hypertension, heart disease and migraine but potentially dangerous in MG. May worsen MG. Use cautiously. Statins (e.g., atorvastatin, pravastatin, rosuvastatin, simvastatin): Used to reduce serum cholesterol. May worsen or precipitate MG. Use cautiously if indicated and at lowest dose needed. Iodinated radiologic contrast agents: Older reports document increased MG weakness, but modern contrast agents appear safer. Use cautiously and observe for worsening. **Chloroquine (Aralen): Used for malaria and amoeba infections. May worsen or precipitate MG. Use with caution.  **Hydroxychloroquine (Plaquenil): Used for malaria, rheumatoid arthritis, and lupus. May worsen or precipitate MG. Use with caution.

## 2023-06-19 NOTE — Progress Notes (Signed)
Chief Complaint  Patient presents with   Myasthenia Gravis    Rm13, alone, Mg:pt stated that she is doing well.       ASSESSMENT AND PLAN  Caroline Peterson is a 47 y.o. female    Seropositive generalized myasthenia gravis  Confirmed by positive acetylcholine receptor antibodies,  CT chest to rule out thymus pathology  Continue prednisone tapering,  CellCept 500 mg twice a day,  Mestinon 60 mg half tablets 2-3 times a day, could not tolerate 60 mg due to GI side effect,  Lab evaluation     DIAGNOSTIC DATA (LABS, IMAGING, TESTING) - I reviewed patient records, labs, notes, testing and imaging myself where available.   MEDICAL HISTORY:  Caroline Peterson, is a 47 year old female seen in request by her primary care doctor [name] for evaluation of weakness, initial evaluation was on October 04, 2020  I reviewed and summarized the referring note. PMHX. Anxiety Iron Deficiency anemia HTN HLD, could not tolerate statin,   Chronic insomnia.  She works as a Facilities manager, was taken to the emergency room by her mother on September 17, 2022, that morning she was ready to work, having backpack on her right shoulder, holding her cell phone, lunch bag, coffee cup by her left hand as she usually does, then she felt left arm weakness to the point of dropping her coffee, she has to squatting down to put off her load, then fell backwards, landed on the hard floor,  She has been complains of generalized weakness since her statin treatment in September 2023, initially was given Lipitor up to 40 mg, by December 2023, she has developed such weakness to the point of difficulty raising arm overhead to shampoo her hair, but denies significant muscle achy pain, Lipitor were stopped, her symptoms gradually improved  Then since August 28, 2022, she was given Zocor 40 mg, few weeks into that, she noticed recurrent bilateral upper extremity weakness,  difficulty raising overhead but not to the previous degree  In addition, she complains of chronic neck pain, radiating pain to her right shoulder, right arm seems to be weaker than the left  Personally reviewed MRI of cervical spine September 17, 2022, degenerative spondylosis C5-6, with endplate osteophyte, towards the right, narrowing of the ventral subarachnoid space, but no compression of the cord, foraminal narrowing on the right, because of the encroachment that osteophyte, bulging disc material,  MRI of the brain, no significant abnormality  Laboratory evaluation in February 2024 showed normal magnesium CPK, CBC showed anemia 11.8, due to heavy menstrual bleeding, normal TSH, BMP showed low calcium 7.9  She has stopped the Zocor 40 mg since September 17, 2022, noticed some improvement of her weakness since  UPDATE May 24 2023: Over the past few weeks she noticed intermittent ptosis, sometimes to the point of blocking left vision, denies double vision, feeling fatigue, denies chewing or swallowing difficulty  But on today's examination, she was noted to have fatigable left ptosis, mild bulbar, mild to moderate limb muscle weakness,  Acetylcholine receptor antibody was positive, binding 6.71, blocking antibody 64%, modulating antibody 76%, Confirm the diagnosis seropositive generalized myasthenia gravis  Rest of the laboratory evaluation showed normal or negative HIV, CPK, hepatitis panel, TSH, CMP, CBC, A1c, mild elevation of LDL 118, triglyceride 187  She is tearful at today's visit, just went through divorce, started a new job, under a lot of stress, suffered a stingray injury in summer, was treated with steroid, complains of  side effect of bloating, flush with steroid  UPDATE Nov 6th 2024: She has GI side effect with Mestinon 60 mg now taking half tablets twice a day, does help her ptosis and muscle strength, tolerating prednisone, now on 30 mg daily, will continue to taper down to 20  mg for 2 weeks, 50 mg for 2 weeks then stay on 10 mg daily, CellCept 500 mg twice a day  Today's examination showed slight eye closure cheek puff weakness, mild neck flexion, proximal upper extremity weakness  PHYSICAL EXAM:   Vitals:   06/19/23 1615  BP: 129/81  Pulse: (!) 58  Weight: 142 lb (64.4 kg)  Height: 5\' 4"  (1.626 m)   Not recorded     Body mass index is 24.37 kg/m.  PHYSICAL EXAMNIATION:  Gen: NAD, conversant, well nourised, well groomed                     Cardiovascular: Regular rate rhythm, no peripheral edema, warm, nontender. Eyes: Conjunctivae clear without exudates or hemorrhage Neck: Supple, no carotid bruits. Pulmonary: Clear to auscultation bilaterally   NEUROLOGICAL EXAM:  MENTAL STATUS: Speech/cognition: Awake, alert, oriented to history taking and casual conversation CRANIAL NERVES: CN II: Visual fields are full to confrontation. Pupils are round equal and briskly reactive to light. CN III, IV, VI: extraocular movement are full, cover and uncover demonstrate bilateral exophoria.  Fatigable left ptosis, to the upper edge of left pupil CN V: Facial sensation is intact to light touch CN VII: Mild eye closure, cheek puff weakness CN VIII: Hearing is normal to causal conversation. CN IX, X: Phonation is normal. CN XI: Head turning and shoulder shrug are intact  MOTOR: Mild neck flexion, mild to moderate bilateral bilateral shoulder abduction, elbow flexion, extension weakness, bilateral hip flexion weakness,  REFLEXES: Reflexes are 1  and symmetric at the biceps, triceps, knees, and ankles. Plantar responses are flexor.  SENSORY: Intact to light touch, pinprick and vibratory sensation are intact in fingers and toes.  COORDINATION: There is no trunk or limb dysmetria noted.  GAIT/STANCE: Able to get up from sit down, squatting down position without assistant, steady gait  REVIEW OF SYSTEMS:  Full 14 system review of systems performed and  notable only for as above All other review of systems were negative.   ALLERGIES: Allergies  Allergen Reactions   Aspirin Nausea And Vomiting    "can take coated aspirin"   Augmentin [Amoxicillin-Pot Clavulanate] Diarrhea   Erythromycin Nausea And Vomiting   Levaquin [Levofloxacin] Other (See Comments)    Trigger thumb; arthralgias   Nitrofurantoin Nausea And Vomiting   Statins Other (See Comments)    Transient muscle weakness/ myalgias   Azithromycin Nausea And Vomiting, Palpitations and Other (See Comments)    "made my heart speed up"    HOME MEDICATIONS: Current Outpatient Medications  Medication Sig Dispense Refill   azelastine (ASTELIN) 0.1 % nasal spray Place 2 sprays into both nostrils 2 (two) times daily. (Patient taking differently: Place 2 sprays into both nostrils 2 (two) times daily as needed for allergies or rhinitis.) 30 mL 12   busPIRone (BUSPAR) 10 MG tablet TAKE 1 TABLET BY MOUTH EVERY MORNING AND 2 BY MOUTH EVERY NIGHT AT BEDTIME 90 tablet 2   calcium citrate (CALCITRATE - DOSED IN MG ELEMENTAL CALCIUM) 950 (200 Ca) MG tablet Take 2 tablets by mouth daily with lunch.     Coenzyme Q10-Omega 3 Fatty Acd (COQMAX OMEGA) 50 MG CAPS Take 2 capsules by mouth  daily with lunch.     COLLAGEN PO Take by mouth daily.     Glycerin-Hypromellose-PEG 400 (DRY EYE RELIEF DROPS OP) Place 1 drop into both eyes 2 (two) times daily.     hydrocortisone (ANUSOL-HC) 2.5 % rectal cream APPLY RECTALLY TO THE AFFECTED AREA THREE TIMES DAILY (Patient taking differently: Place 1 Application rectally 3 (three) times daily as needed for hemorrhoids.) 30 g 1   LORazepam (ATIVAN) 0.5 MG tablet TAKE 1/2 TABLET(0.25 MG) BY MOUTH DAILY AS NEEDED FOR ANXIETY (Patient taking differently: Take 0.5 mg by mouth as needed for anxiety.) 30 tablet 5   meloxicam (MOBIC) 15 MG tablet Take 1 tablet (15 mg total) by mouth daily. 30 tablet 0   metoprolol succinate (TOPROL-XL) 50 MG 24 hr tablet TAKE 1 TABLET BY  MOUTH TWICE DAILY WITH OR IMMEDIATELY FOLLOWING A MEAL 180 tablet 2   Multiple Vitamin (MULTIVITAMIN) tablet Take 1 tablet by mouth daily with lunch.     mycophenolate (CELLCEPT) 500 MG tablet Take 1 tablet (500 mg total) by mouth 2 (two) times daily. 60 tablet 11   predniSONE (DELTASONE) 10 MG tablet Take 4 tablets (40 mg total) by mouth daily with breakfast. 120 tablet 11   pyridostigmine (MESTINON) 60 MG tablet Take 1 tablet (60 mg total) by mouth 3 (three) times daily. 90 tablet 11   traZODone (DESYREL) 50 MG tablet TAKE 1/2 TO 1 TABLET(25 TO 50 MG) BY MOUTH AT BEDTIME AS NEEDED FOR SLEEP 30 tablet 3   valACYclovir (VALTREX) 500 MG tablet TAKE 1 TABLET(500 MG) BY MOUTH DAILY 90 tablet 3   No current facility-administered medications for this visit.    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 Jhonnie Garner;  s/p yag laser peripheral iridotomy left eye 2018;  bilateral eye 2019   Arthritis    neck   Complication of anesthesia    per pt had issues post op in PACU with excessive sleepiness and being sedated following sinus surgery , nurse had to to jaw thrust to maintain airway.  anesthesia record in care everywhere date of surgery 08-14-2022 @ [facility]  (in epic pt spoke with one of our anesthesiologist, Dr A. Hodierne MDA, note dated 12-28-2022)   Dry eye syndrome of both eyes    Family history of adverse reaction to anesthesia    father--- hard to wake   GERD (gastroesophageal reflux disease)    01-25-2023 per pt does not take any meds   History of hemolysis, elevated liver enzymes, and low platelet (HELLP) syndrome    History of MRSA infection    boils on skin (from shaving)   Hypertension    IDA (iron deficiency anemia)    Intracranial atherosclerosis    followed by pcp;  incidental finding on CT/ MRI   Menorrhagia    Multiple thyroid nodules    incidental finding on head CT 2023/   ultrasound 05-11-2022 in [place],  bilateral nodules , no bx , did not meet criteria   Nonrheumatic mitral (valve) insufficiency    Palpitations    cardiologist--- dr Ramona Lions   Wears glasses     PAST SURGICAL HISTORY: Past Surgical History:  Procedure Laterality Date   CESAREAN SECTION  11/06/2009   per pt w/ general anesthesia   COLONOSCOPY WITH ESOPHAGOGASTRODUODENOSCOPY (EGD)  06/19/2022   dr beavers   DILATION AND CURETTAGE OF UTERUS  09/06/2008   @  [facility];   for POC   DILITATION & CURRETTAGE/HYSTROSCOPY WITH NOVASURE ABLATION N/A 02/13/2023   Procedure: DILATATION & CURETTAGE/HYSTEROSCOPY WITH NOVASURE ABLATION; MYOSURE RESECTION;  Surgeon: Olivia Mackie, MD;  Location: [facility];  Service: Gynecology;  Laterality: N/A;   NASAL/SINUS ENDOSCOPY  08/14/2022   @ [facility] by dr d. Hollace Hayward;  bilateral resection subucous turbinates,  septoplasty,  right maxillary antrostomy w/ removal tissue   TONSILLECTOMY AND ADENOIDECTOMY  1986   WISDOM TOOTH EXTRACTION  1994   per pt w/ anesthesia    FAMILY HISTORY: Family History  Problem Relation Age of Onset   Hypertension Mother    Hyperlipidemia Mother    Diabetes Father    Hyperlipidemia Father    Hypertension Father    Kidney disease Father    Hypertension Brother    Cancer Maternal Grandmother    Cancer Maternal Grandfather    Cancer Paternal Grandmother    Heart disease Paternal Grandmother    Heart disease Paternal Grandfather    Hypertension Paternal Grandfather    Colon cancer Neg Hx    Esophageal cancer Neg Hx    Liver cancer Neg Hx    Pancreatic cancer Neg Hx    Rectal cancer Neg Hx    Stomach cancer Neg Hx     SOCIAL HISTORY: Social History   Socioeconomic History   Marital status: Divorced    Spouse name: Malena Timpone   Number of children: 1   Years of education: Not on file   Highest education level: Master's degree (e.g., MA, MS, MEng, MEd, MSW, MBA)  Occupational History     Employer:  [employer]  Tobacco Use   Smoking status: Never   Smokeless tobacco: Never  Vaping Use   Vaping status: Never Used  Substance and Sexual Activity   Alcohol use: Yes    Comment: occasional   Drug use: Never   Sexual activity: Yes    Birth control/protection: None  Other Topics Concern   Not on file  Social History Narrative   Lives with husband and daughter.    Social Determinants of Health   Financial Resource Strain: Low Risk  (11/10/2022)   Overall Financial Resource Strain (CARDIA)    Difficulty of Paying Living Expenses: Not hard at all  Food Insecurity: Low Risk  (03/29/2023)   Received from [facility]   Hunger Vital Sign    Worried About Running Out of Food in the Last Year: Never true    Ran Out of Food in the Last Year: Never true  Transportation Needs: Not on file (03/29/2023)  Physical Activity: Unknown (11/10/2022)   Exercise Vital Sign    Days of Exercise per Week: 0 days    Minutes of Exercise per Session: Not on file  Stress: No Stress Concern Present (11/10/2022)   [employer] of Occupational Health - Occupational Stress Questionnaire    Feeling of Stress : Not at all  Social Connections: Moderately Integrated (11/10/2022)   Social Connection and Isolation Panel [NHANES]    Frequency of Communication with Friends and Family: More than three times a week    Frequency of Social Gatherings with Friends and Family: More than three times a week    Attends Religious Services: More than 4 times per year    Active Member of [employer] or Organizations: Yes    Attends Banker Meetings: More than 4 times per year    Marital Status: Separated  Intimate Partner Violence: Not on file  Levert Feinstein, M.D. Ph.D.  [facility] [address] [place], [place] [address] Ph: [phone] Fax: [phone]  CC:  Ardith Dark, MD [address] [place],  [place] [address]  Ardith Dark, MD   Total time  spent reviewing the chart, obtaining history, examined patient, ordering tests, documentation, consultations and family, care coordination was  50 minutes

## 2023-06-20 LAB — COMPREHENSIVE METABOLIC PANEL
ALT: 19 [IU]/L (ref 0–32)
AST: 15 [IU]/L (ref 0–40)
Albumin: 4.3 g/dL (ref 3.9–4.9)
Alkaline Phosphatase: 70 [IU]/L (ref 44–121)
BUN/Creatinine Ratio: 33 — ABNORMAL HIGH (ref 9–23)
BUN: 19 mg/dL (ref 6–24)
Bilirubin Total: 0.6 mg/dL (ref 0.0–1.2)
CO2: 23 mmol/L (ref 20–29)
Calcium: 9.1 mg/dL (ref 8.7–10.2)
Chloride: 103 mmol/L (ref 96–106)
Creatinine, Ser: 0.58 mg/dL (ref 0.57–1.00)
Globulin, Total: 2.4 g/dL (ref 1.5–4.5)
Glucose: 123 mg/dL — ABNORMAL HIGH (ref 70–99)
Potassium: 4.4 mmol/L (ref 3.5–5.2)
Sodium: 139 mmol/L (ref 134–144)
Total Protein: 6.7 g/dL (ref 6.0–8.5)
eGFR: 112 mL/min/{1.73_m2} (ref >59)

## 2023-06-20 LAB — CBC WITH DIFFERENTIAL/PLATELET
Basophils Absolute: 0 10*3/uL (ref 0.0–0.2)
Basos: 0 %
EOS (ABSOLUTE): 0 10*3/uL (ref 0.0–0.4)
Eos: 0 %
Hematocrit: 41.2 % (ref 34.0–46.6)
Hemoglobin: 13.7 g/dL (ref 11.1–15.9)
Immature Grans (Abs): 0.1 10*3/uL (ref 0.0–0.1)
Immature Granulocytes: 1 %
Lymphocytes Absolute: 0.6 10*3/uL — ABNORMAL LOW (ref 0.7–3.1)
Lymphs: 6 %
MCH: 31 pg (ref 26.6–33.0)
MCHC: 33.3 g/dL (ref 31.5–35.7)
MCV: 93 fL (ref 79–97)
Monocytes Absolute: 0.1 10*3/uL (ref 0.1–0.9)
Monocytes: 1 %
Neutrophils Absolute: 8.6 10*3/uL — ABNORMAL HIGH (ref 1.4–7.0)
Neutrophils: 92 %
Platelets: 248 10*3/uL (ref 150–450)
RBC: 4.42 x10E6/uL (ref 3.77–5.28)
RDW: 14.7 % (ref 11.7–15.4)
WBC: 9.3 10*3/uL (ref 3.4–10.8)

## 2023-06-25 ENCOUNTER — Ambulatory Visit: Payer: BC Managed Care – PPO | Admitting: Family Medicine

## 2023-07-01 ENCOUNTER — Telehealth: Payer: Self-pay | Admitting: Neurology

## 2023-07-01 DIAGNOSIS — G7000 Myasthenia gravis without (acute) exacerbation: Secondary | ICD-10-CM

## 2023-07-01 DIAGNOSIS — D4989 Neoplasm of unspecified behavior of other specified sites: Secondary | ICD-10-CM | POA: Insufficient documentation

## 2023-07-01 NOTE — Telephone Encounter (Signed)
[facility] called and LVM stating that they have a Call Report on the pt. She says this is not on the CT chest. Please call back even if report has been received so that they can document that the report/results have been given.  Please advise.

## 2023-07-01 NOTE — Telephone Encounter (Signed)
I called patient about CT chest findings, mediastinal mass suspicious for thymoma  Her myasthenia gravis symptoms is overall under good control based on last evaluation on June 19, 2023  I have offered her the option of  1.,  Follow-up clinically, repeat CT scan in 6 to 12 months to establish stability  2.,  Refer her to cardiothoracic surgeon for evaluation now, she worry about the possibility of malignant thymoma, prefer to start cardiothoracic referral now  3.,  I will also refer her to [facility] academic neuromuscular specialist for second opinion  1. 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 In the setting of myasthenia gravis, suspicious for thymoma. 2. No pulmonary nodule/mass or adenopathy.  Orders Placed This Encounter  Procedures   Ambulatory referral to Neurology   Ambulatory referral to Cardiothoracic Surgery

## 2023-07-02 ENCOUNTER — Telehealth: Payer: Self-pay | Admitting: Neurology

## 2023-07-02 ENCOUNTER — Encounter: Payer: Self-pay | Admitting: Neurology

## 2023-07-02 ENCOUNTER — Other Ambulatory Visit: Payer: Self-pay | Admitting: Family Medicine

## 2023-07-02 NOTE — Telephone Encounter (Signed)
Referral for Neurology faxed to [facility]. Phone: [phone] Fax: [phone]

## 2023-07-02 NOTE — Telephone Encounter (Signed)
Called number for [facility] Radiology reading room and they (diane) stated they just needed to confirm that the patients results are visible in the chart. She states provider needs to be made aware so they can be reviewed as soon as possible. I advised I would forward to the provider

## 2023-07-02 NOTE — Telephone Encounter (Signed)
Referral for Cardiothoracic Surgery faxed to [facility]. Phone: [phone] Fax: [phone]

## 2023-07-03 ENCOUNTER — Encounter: Payer: Self-pay | Admitting: Internal Medicine

## 2023-07-03 ENCOUNTER — Telehealth (INDEPENDENT_AMBULATORY_CARE_PROVIDER_SITE_OTHER): Payer: Federal, State, Local not specified - PPO | Admitting: Internal Medicine

## 2023-07-03 DIAGNOSIS — B999 Unspecified infectious disease: Secondary | ICD-10-CM

## 2023-07-03 DIAGNOSIS — J069 Acute upper respiratory infection, unspecified: Secondary | ICD-10-CM

## 2023-07-03 DIAGNOSIS — G7000 Myasthenia gravis without (acute) exacerbation: Secondary | ICD-10-CM | POA: Diagnosis not present

## 2023-07-03 DIAGNOSIS — J029 Acute pharyngitis, unspecified: Secondary | ICD-10-CM

## 2023-07-03 DIAGNOSIS — D849 Immunodeficiency, unspecified: Secondary | ICD-10-CM | POA: Diagnosis not present

## 2023-07-03 MED ORDER — AMOXICILLIN 500 MG PO CAPS: 500.0000 mg | ORAL_CAPSULE | Freq: Two times a day (BID) | ORAL | 0 refills | Status: DC

## 2023-07-03 NOTE — Patient Instructions (Signed)
After Visit Summary  Today's Visit We evaluated you for upper respiratory symptoms including nasal congestion, drainage, and sore throat. Given your myasthenia gravis and current medications that affect your immune system, we are taking a proactive approach to prevent complications.  Your Treatment Plan:  1. Medications:    - Take Amoxicillin 875mg  twice daily for 10 days    - Continue ALL your myasthenia gravis medications (CellCept, prednisone, pyridostigmine) without stopping    - Use saline nasal spray as needed for congestion  2. Testing and Monitoring:    - Complete a COVID-19 test     - Monitor for any worsening of symptoms, especially:      * Increased difficulty breathing      * New or worsening cough      * Fever      * Worsening muscle weakness  3. Important Instructions:    - Do NOT stop your CellCept or other myasthenia medications during this infection    - Use the saline spray after blowing your nose to help clear congestion    - You may take 1-10 days off work based on your symptoms    - If positive for COVID-19, follow quarantine guidelines  4. Follow-up Care:    - You will receive a call within 1-2 weeks to schedule an appointment with an Infectious Disease specialist    - This specialist will help manage future infections while you're on immunosuppression    - Call our office if you haven't received the scheduling call within 2 weeks  When to Seek Immediate Care: - If you develop difficulty breathing - If you notice increased muscle weakness - If your symptoms significantly worsen - If you develop high fever  Your next steps: 1. Start the antibiotic as prescribed 2. Get your COVID-19 test 3. Wait for the call about your Infectious Disease appointment 4. Use the work excuse as needed 5. Contact our office if symptoms worsen or you haven't heard about the specialist appointment within 2 weeks

## 2023-07-03 NOTE — Progress Notes (Signed)
[facility]: [phone]   Virtual Medical Office Visit - Video Telemedicine   Patient:  Caroline Peterson (1976/03/01)  MRN:   [id number]      Date:   07/03/2023  Today's Healthcare Provider: Lula Olszewski, MD   Assessment & Plan  Assessment & Plan   Main reason for visit: Nasal Congestion, Post nasal drip, Sneezing (Wants to know what medication she can take with myasthenia gravis for symptoms.), and Cough (Producing clear mucus.)   Assessment & Plan Upper respiratory tract infection, unspecified type Upper Respiratory Infection (ICD-10: J06.9) Assessment: Patient presents with acute upper respiratory symptoms in setting of immunosuppression for myasthenia gravis. Given history of infections progressing to lower respiratory involvement and current immunocompromised status, early intervention warranted. Plan:  Amoxicillin 875mg  twice daily for 10 days COVID-19 testing recommended for quarantine decision-making but she would not take Paxlovid again Saline nasal spray for symptomatic relief, avoiding full sinus rinse systems Work excuse provided for 1-10 days based on symptom resolution Return for in-person evaluation if develops cough or worsening symptoms Sore throat  Myasthenia gravis (HCC) Myasthenia Gravis (ICD-10: G70.00) Assessment: Recently diagnosed, reported as mild by neurology. Currently stable on CellCept, prednisone, and pyridostigmine. Immunosuppression complicates management of intercurrent infections. Plan:  Continue current immunosuppressive regimen without interruption Referral to Infectious Disease specialist for ongoing management of infections in setting of immunosuppression Education provided regarding risks of discontinuing immunosuppression Monitor for any exacerbation of myasthenia symptoms during infection Immunocompromised patient (HCC)  Recurrent infections   Treatment plan discussed and reviewed in detail.  Explained medication safety and potential side effects.  Answered all patient questions and confirmed understanding and comfort with the plan. Encouraged patient to contact our office if they have any questions or concerns.  Agreed on patient coming for a sooner office visit if symptoms worsen, persist, or new symptoms develop. Discussed precautions in case of needing to visit the Emergency Department.   No follow-ups on file. Future Appointments  Date Time Provider Department Center  07/10/2023  8:20 AM Ardith Dark, MD [facility]  07/22/2023  1:00 PM Odette Fraction, MD [facility]  07/22/2023  3:15 PM Loreli Slot, MD [facility]  10/01/2023  3:00 PM Levert Feinstein, MD [facility] None  03/18/2024  8:20 AM Ardith Dark, MD [facility]   Patient Care Team: Ardith Dark, MD as PCP - General (Family Medicine) Rollene Rotunda, MD as PCP - Cardiology (Cardiology) Care, [facility] Rollene Rotunda, MD as Consulting Physician (Cardiology) Salvatore Marvel, MD as Consulting Physician (Orthopedic Surgery) Olivia Mackie, MD as Consulting Physician (Obstetrics and Gynecology) Daisy Lazar, MD as Consulting Physician (Ophthalmology)        Subjective:   Chief Complaint / Reason for Visit:  Nasal Congestion, Post nasal drip, Sneezing (Wants to know what medication she can take with myasthenia gravis for symptoms.), and Cough (Producing clear mucus.)   47 y.o. 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   Discussed the use of AI scribe software for clinical note transcription with the patient, who gave verbal consent to proceed.  Patient presents with a  2-day history of upper respiratory symptoms in the context of recently diagnosed myasthenia gravis on immunosuppressive therapy. Symptoms began with postnasal drainage causing nocturnal throat discomfort, progressing to sneezing, coughing, and rhinorrhea. Currently experiencing nasal congestion as the predominant symptom. Denies dyspnea at present.  History of Present Illness   The patient, recently diagnosed with myasthenia gravis and currently on immunosuppressive therapy (CellCept, Prednisone, and an unspecified muscle-related medication), presented with symptoms of upper respiratory infection. The onset was two days prior, beginning with postnasal drainage leading to throat discomfort. The following day, the patient experienced sneezing, coughing, and a runny nose.  The patient has a history of upper respiratory infections, typically experiencing two episodes per year. They also reported a history of pneumonia at age 81. When ill, the patient's symptoms tend to gravitate towards the chest, making it difficult to eliminate coughs. In the past, treatments have included albuterol, Kenalog shots, and codeine, depending on the severity and persistence of the cough.  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 Despite this, they reported no significant stress. They expressed concern about the potential for their current symptoms to worsen, given their compromised immunity due to the CellCept.  The patient also has a history of nasal surgery and has previously used saline sinus rinses for post-operative care. They expressed willingness to use a misting spray for current nasal congestion, but were hesitant about more intensive rinsing methods.  The patient's myasthenia gravis  is reportedly mild according to their neurologist. They expressed curiosity about whether their immunosuppressive therapy should be paused during illness, as a friend with rheumatoid arthritis does so during infections. They also mentioned a previous adverse reaction to Paxlovid, which caused vomiting.  The patient has previously tolerated Amoxicillin well and reported no significant issues with it. They have a known history of diarrhea with this antibiotic, but did not express concern about this side effect.     Patient has notable history of respiratory tract infections requiring intervention, typically experiencing two episodes annually. Prior episodes have often progressed to lower respiratory involvement with prolonged coughing episodes requiring various interventions including albuterol, Kenalog injections, and occasionally codeine-based medications   Reviewed chart data: has Hypertension; Palpitation; Panic attacks; Allergic rhinitis; Insomnia; Cold sore; Osteoarthritis; Eye abnormality; Lipoma; Hearing loss; Other fatigue; Dyslipidemia; Adjustment disorder; Intracranial atherosclerosis; Iron deficiency; Dysmenorrhea; Statin intolerance; Myasthenia gravis (HCC); and Thymoma on their problem list. CoQmax Omega, Collagen-Vitamin C-Biotin, Glycerin-Hypromellose-PEG 400, LORazepam, amoxicillin, azelastine, busPIRone, calcium citrate, hydrocortisone, meloxicam, metoprolol succinate, multivitamin, mycophenolate, predniSONE, pyridostigmine, traZODone, and valACYclovir       Objective:  Physical Exam         Limited by video. No coughing noted  General Appearance:  Well Developed, Well Nourished, No Acute Distress by Limited Video Assessment Pulmonary:  No Respiratory Distress Apparent. Normal Work of Breathing.   Neurological:  Awake, Alert. No Obvious Focal Neurological Deficits or Cognitive Impairments.  Sensorium Seems Unclouded. Psychiatric:  Appropriate Mood, Pleasant Demeanor, Calm,  Articulate, Good Mood  Results Reviewed:         ------------------------------------------------------ Attestation:  Today's Healthcare Provider Lula Olszewski, MD was located at office at [facility] at [facility] [address], [place] [address]. The patient was located in car, at work. Today's Telemedicine visit was conducted via Video after consent for telemedicine was obtained:  Video connection was never lost All video encounter participant identities and locations confirmed visually and verbally.  Signed: Lula Olszewski, MD 07/03/2023 7:57  PM

## 2023-07-03 NOTE — Assessment & Plan Note (Signed)
Myasthenia Gravis (ICD-10: G70.00) Assessment: Recently diagnosed, reported as mild by neurology. Currently stable on CellCept, prednisone, and pyridostigmine. Immunosuppression complicates management of intercurrent infections. Plan:  Continue current immunosuppressive regimen without interruption Referral to Infectious Disease specialist for ongoing management of infections in setting of immunosuppression Education provided regarding risks of discontinuing immunosuppression Monitor for any exacerbation of myasthenia symptoms during infection

## 2023-07-10 ENCOUNTER — Ambulatory Visit (INDEPENDENT_AMBULATORY_CARE_PROVIDER_SITE_OTHER): Payer: Federal, State, Local not specified - PPO | Admitting: Family Medicine

## 2023-07-10 ENCOUNTER — Encounter: Payer: Self-pay | Admitting: Family Medicine

## 2023-07-10 VITALS — BP 136/82 | HR 75 | Temp 97.7°F | Ht 64.0 in | Wt 143.2 lb

## 2023-07-10 DIAGNOSIS — I159 Secondary hypertension, unspecified: Secondary | ICD-10-CM

## 2023-07-10 DIAGNOSIS — G7000 Myasthenia gravis without (acute) exacerbation: Secondary | ICD-10-CM | POA: Diagnosis not present

## 2023-07-10 DIAGNOSIS — D4989 Neoplasm of unspecified behavior of other specified sites: Secondary | ICD-10-CM

## 2023-07-10 DIAGNOSIS — J019 Acute sinusitis, unspecified: Secondary | ICD-10-CM | POA: Diagnosis not present

## 2023-07-10 MED ORDER — AMOXICILLIN 875 MG PO TABS: 875.0000 mg | ORAL_TABLET | Freq: Two times a day (BID) | ORAL | 0 refills | Status: DC

## 2023-07-10 MED ORDER — BENZONATATE 200 MG PO CAPS: 200.0000 mg | ORAL_CAPSULE | Freq: Two times a day (BID) | ORAL | 0 refills | Status: DC | PRN

## 2023-07-10 NOTE — Patient Instructions (Addendum)
It was very nice to see you today!  I am glad you are feeling better.  You do not need to see infectious disease.  I can send in Tessalon.  Please also take the amoxicillin if you are not feeling better.  I will refer you to see cardiothoracic surgeon at [facility] to discuss removing your thymus.  Return if symptoms worsen or fail to improve.   Take care, Dr [name]  PLEASE NOTE:  If you had any lab tests, please let us know if you have not heard back within a few days. You may see your results on mychart before we have a chance to review them but we will give you a call once they are reviewed by [place].   If we ordered any referrals today, please let us know if you have not heard from their office within the next week.   If you had any urgent prescriptions sent in today, please check with the pharmacy within an hour of our visit to make sure the prescription was transmitted appropriately.   Please try these tips to maintain a healthy lifestyle:  Eat at least 3 REAL meals and 1-2 snacks per day.  Aim for no more than 5 hours between eating.  If you eat breakfast, please do so within one hour of getting up.   Each meal should contain half fruits/vegetables, one quarter protein, and one quarter carbs (no bigger than a computer mouse)  Cut down on sweet beverages. This includes juice, soda, and sweet tea.   Drink at least 1 glass of water with each meal and aim for at least 8 glasses per day  Exercise at least 150 minutes every week.

## 2023-07-10 NOTE — Assessment & Plan Note (Signed)
Blood pressure at goal metoprolol succinate 50 mg daily.

## 2023-07-10 NOTE — Progress Notes (Signed)
   Caroline Peterson is a 47 y.o. female who presents today for an office visit.  Assessment/Plan:  New/Acute Problems: Sinusitis  Improving with amoxicillin.  No red flag signs or symptoms.  She is currently on CellCept and prednisone which could prolong her course however anticipate she will recover from this fully.  Will send in Tessalon as needed for cough.  She was referred to infectious disease however do not think this is necessary at this point.  She will cancel her appointment.  We will send in a pocket prescription for extended course of amoxicillin if symptoms do not improve over the next several days.  We encouraged hydration.  She will let us know if symptoms do not continue to improve as above.  She will follow-up as needed.  Chronic Problems Addressed Today: Myasthenia gravis (HCC) Had lengthy discussion with patient regarding her recent diagnosis.  Symptoms were mild and have improved significantly since starting on her regimen of pyridostigmine, prednisone, and CellCept.  She was also found to have a thymoma.  See below problem.  She will continue her current regimen per neurology and follow-up with them again in a few months.  Thymoma Found on recent CT chest.  Discussed with patient this is likely contributing to her myasthenia.  She is interested in surgical consultation for removal.  Will refer her to [facility] cardiothoracic surgeon per patient request.  Hypertension Blood pressure at goal metoprolol succinate 50 mg daily.     Subjective:  HPI:  Patient here today for follow up. She was last seen here about 3 months ago. Since our last visit she noticed some drooping of her eyelid. She went to ophthalmology and was subsequently referred to neurology. She had work up with them that was consistent with myasthenia gravis. She was started on a regimen with cellcept, prednisone, and pyridostigmine.  Neurology ordered a chest CT scan with contrast a couple weeks ago  which did show heterogeneous anterior mediastinal mass consistent with thymoma.  They have referred her to cardiothoracic surgery.  Overall her myasthenia symptoms have improved.  No longer having eyelid drooping.  No significant side effects with current medication regimen.  About a week or so ago she did start developing sinus congestion and runny nose.  Had a virtual visit here.  Was seen by different provider and was started on amoxicillin.  Her symptoms do seem to be improving.        Objective:  Physical Exam: BP 136/82   Pulse 75   Temp 97.7 F (36.5 C)   Ht 5\' 4"  (1.626 m)   Wt 143 lb 3.2 oz (65 kg)   LMP  (LMP Unknown)   SpO2 97%   BMI 24.58 kg/m   Gen: No acute distress, resting comfortably HEENT: TMs clear. CV: Regular rate and rhythm with no murmurs appreciated Pulm: Normal work of breathing, clear to auscultation bilaterally with no crackles, wheezes, or rhonchi Neuro: Grossly normal, moves all extremities Psych: Normal affect and thought content  Time Spent: 40 minutes of total time was spent on the date of the encounter performing the following actions: chart review prior to seeing the patient including recent visit with specialists and other providers, obtaining history, performing a medically necessary exam, counseling on the treatment plan, placing orders, and documenting in our EHR.        Katina Degree. [name], MD 07/10/2023 9:09 AM

## 2023-07-10 NOTE — Assessment & Plan Note (Signed)
Found on recent CT chest.  Discussed with patient this is likely contributing to her myasthenia.  She is interested in surgical consultation for removal.  Will refer her to [facility] cardiothoracic surgeon per patient request.

## 2023-07-10 NOTE — Assessment & Plan Note (Signed)
Had lengthy discussion with patient regarding her recent diagnosis.  Symptoms were mild and have improved significantly since starting on her regimen of pyridostigmine, prednisone, and CellCept.  She was also found to have a thymoma.  See below problem.  She will continue her current regimen per neurology and follow-up with them again in a few months.

## 2023-07-15 NOTE — Telephone Encounter (Signed)
Please advise 

## 2023-07-15 NOTE — Telephone Encounter (Signed)
Yes she can get the flu shot.  Caroline Peterson. [name], MD 07/15/2023 1:05 PM

## 2023-07-19 ENCOUNTER — Encounter: Payer: Self-pay | Admitting: Family Medicine

## 2023-07-19 NOTE — Telephone Encounter (Signed)
See note

## 2023-07-19 NOTE — Telephone Encounter (Signed)
I will forward to lisa. Please place new referral if needed.  Caroline Peterson. [name], MD 07/19/2023 3:29 PM

## 2023-07-22 ENCOUNTER — Institutional Professional Consult (permissible substitution) (INDEPENDENT_AMBULATORY_CARE_PROVIDER_SITE_OTHER): Payer: BC Managed Care – PPO | Admitting: Thoracic Surgery (Cardiothoracic Vascular Surgery)

## 2023-07-22 ENCOUNTER — Ambulatory Visit: Payer: Federal, State, Local not specified - PPO | Admitting: Infectious Diseases

## 2023-07-22 ENCOUNTER — Encounter: Payer: Self-pay | Admitting: Thoracic Surgery (Cardiothoracic Vascular Surgery)

## 2023-07-22 VITALS — BP 119/81 | HR 83 | Resp 18 | Ht 64.0 in | Wt 144.0 lb

## 2023-07-22 DIAGNOSIS — J9859 Other diseases of mediastinum, not elsewhere classified: Secondary | ICD-10-CM | POA: Diagnosis not present

## 2023-07-22 NOTE — Progress Notes (Signed)
PCP is Ardith Dark, MD Referring Provider is Levert Feinstein, MD  Chief Complaint  Patient presents with   Mediastinal Mass    CT CHEST 10/24    HPI: Caroline Peterson is sent for consultation regarding a thymoma.  Caroline Peterson is a 47 year old woman with myasthenia gravis, hypertension, hyperlipidemia, thyroid nodules, mitral insufficiency, HELLP syndrome, arthritis in the neck, reflux, and a recently discovered anterior mediastinal mass.  She first developed symptoms of weakness in October 2023.  Initially she noted she would get fatigued and weak trying to wash her hair.  She had just started on statin a couple of months prior.  That was discontinued.  She had nasal surgery in January.  She had some difficulty waking up after that.  Had not been diagnosed with myasthenia at that time.  Was started back on a different statin and had worsening weakness again.  That was again discontinued.  Developed a ptosis of the left eye and was seen by ophthalmology.  Was then diagnosed with seropositive myasthenia gravis.  Acetylcholine receptor binding antibody was positive at 6.71, blocking antibody 64% modulating antibody 76%.  Seen by Dr. Terrace Arabia of neurology.  Started on prednisone and Mestinon.  Had significant improvement in her symptoms.  CT of the chest showed an anterior mediastinal mass.  Zubrod Score: At the time of surgery this patient's most appropriate activity status/level should be described as: [x]     0    Normal activity, no symptoms []     1    Restricted in physical strenuous activity but ambulatory, able to do out light work []     2    Ambulatory and capable of self care, unable to do work activities, up and about >50 % of waking hours                              []     3    Only limited self care, in bed greater than 50% of waking hours []     4    Completely disabled, no self care, confined to bed or chair []     5    Moribund  Past Medical History:  Diagnosis Date   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 Jhonnie Garner;  s/p yag laser peripheral iridotomy left eye 2018;  bilateral eye 2019   Arthritis    neck   Complication of anesthesia    per pt had issues post op in PACU with excessive sleepiness and being sedated following sinus surgery , nurse had to to jaw thrust to maintain airway.  anesthesia record in care everywhere date of surgery 08-14-2022 @ [facility]  (in epic pt spoke with one of our anesthesiologist, Dr A. Hodierne MDA, note dated 12-28-2022)   Dry eye syndrome of both eyes    Family history of adverse reaction to anesthesia    father--- hard to wake   GERD (gastroesophageal reflux disease)    01-25-2023 per pt does not take any meds   History of hemolysis, elevated liver enzymes, and low platelet (HELLP) syndrome    History of MRSA infection    boils on skin (from shaving)   Hypertension    IDA (iron deficiency anemia)    Intracranial atherosclerosis    followed by pcp;  incidental finding on CT/ MRI   Menorrhagia    Multiple thyroid nodules    incidental finding on head  CT 2023/  ultrasound 05-11-2022 in [place],  bilateral nodules , no bx , did not meet criteria   Nonrheumatic mitral (valve) insufficiency    Palpitations    cardiologist--- dr [place] [facility]   Wears glasses     Past Surgical History:  Procedure Laterality Date   CESAREAN SECTION  11/06/2009   per pt w/ general anesthesia   COLONOSCOPY WITH ESOPHAGOGASTRODUODENOSCOPY (EGD)  06/19/2022   dr beavers   DILATION AND CURETTAGE OF UTERUS  09/06/2008   @ [facility];   for POC   DILITATION & CURRETTAGE/HYSTROSCOPY WITH NOVASURE ABLATION N/A 02/13/2023   Procedure: DILATATION & CURETTAGE/HYSTEROSCOPY WITH NOVASURE ABLATION; MYOSURE RESECTION;  Surgeon: Olivia Mackie, MD;  Location: [facility] ;  Service: Gynecology;  Laterality: N/A;   NASAL/SINUS ENDOSCOPY  08/14/2022   @ [facility] by dr d. Hollace Hayward;  bilateral resection subucous turbinates,   septoplasty,  right maxillary antrostomy w/ removal tissue   TONSILLECTOMY AND ADENOIDECTOMY  1986   WISDOM TOOTH EXTRACTION  1994   per pt w/ anesthesia    Family History  Problem Relation Age of Onset   Hypertension Mother    Hyperlipidemia Mother    Diabetes Father    Hyperlipidemia Father    Hypertension Father    Kidney disease Father    Hypertension Brother    Cancer Maternal Grandmother    Cancer Maternal Grandfather    Cancer Paternal Grandmother    Heart disease Paternal Grandmother    Heart disease Paternal Grandfather    Hypertension Paternal Grandfather    Colon cancer Neg Hx    Esophageal cancer Neg Hx    Liver cancer Neg Hx    Pancreatic cancer Neg Hx    Rectal cancer Neg Hx    Stomach cancer Neg Hx     Social History Social History   Tobacco Use   Smoking status: Never   Smokeless tobacco: Never  Vaping Use   Vaping status: Never Used  Substance Use Topics   Alcohol use: Yes    Comment: occasional   Drug use: Never    Current Outpatient Medications  Medication Sig Dispense Refill   azelastine (ASTELIN) 0.1 % nasal spray Place 2 sprays into both nostrils 2 (two) times daily. (Patient taking differently: Place 2 sprays into both nostrils 2 (two) times daily as needed for allergies or rhinitis.) 30 mL 12   busPIRone (BUSPAR) 10 MG tablet TAKE 1 TABLET BY MOUTH EVERY MORNING AND 2 BY MOUTH EVERY NIGHT AT BEDTIME 90 tablet 2   calcium citrate (CALCITRATE - DOSED IN MG ELEMENTAL CALCIUM) 950 (200 Ca) MG tablet Take 2 tablets by mouth daily with lunch.     Coenzyme Q10-Omega 3 Fatty Acd (COQMAX OMEGA) 50 MG CAPS Take 2 capsules by mouth daily with lunch.     COLLAGEN PO Take by mouth daily.     Glycerin-Hypromellose-PEG 400 (DRY EYE RELIEF DROPS OP) Place 1 drop into both eyes 2 (two) times daily.     hydrocortisone (ANUSOL-HC) 2.5 % rectal cream APPLY RECTALLY TO THE AFFECTED AREA THREE TIMES DAILY (Patient taking differently: Place 1 Application rectally 3  (three) times daily as needed for hemorrhoids.) 30 g 1   metoprolol succinate (TOPROL-XL) 50 MG 24 hr tablet TAKE 1 TABLET BY MOUTH TWICE DAILY WITH OR IMMEDIATELY FOLLOWING A MEAL 180 tablet 2   Multiple Vitamin (MULTIVITAMIN) tablet Take 1 tablet by mouth daily with lunch.     mycophenolate (CELLCEPT) 500 MG tablet Take 1 tablet (500 mg total)  by mouth 2 (two) times daily. 60 tablet 11   predniSONE (DELTASONE) 10 MG tablet Take 4 tablets (40 mg total) by mouth daily with breakfast. 120 tablet 11   pyridostigmine (MESTINON) 60 MG tablet Take 1 tablet (60 mg total) by mouth 3 (three) times daily. 90 tablet 11   traZODone (DESYREL) 50 MG tablet TAKE 1/2 TO 1 TABLET(25 TO 50 MG) BY MOUTH AT BEDTIME AS NEEDED FOR SLEEP 30 tablet 3   valACYclovir (VALTREX) 500 MG tablet TAKE 1 TABLET(500 MG) BY MOUTH DAILY 90 tablet 3   benzonatate (TESSALON) 200 MG capsule Take 1 capsule (200 mg total) by mouth 2 (two) times daily as needed for cough. (Patient not taking: Reported on 07/22/2023) 20 capsule 0   LORazepam (ATIVAN) 0.5 MG tablet TAKE 1/2 TABLET(0.25 MG) BY MOUTH DAILY AS NEEDED FOR ANXIETY (Patient not taking: Reported on 07/22/2023) 30 tablet 5   meloxicam (MOBIC) 15 MG tablet Take 1 tablet (15 mg total) by mouth daily. (Patient not taking: Reported on 07/22/2023) 30 tablet 0   No current facility-administered medications for this visit.    Allergies  Allergen Reactions   Aspirin Nausea And Vomiting    "can take coated aspirin"   Augmentin [Amoxicillin-Pot Clavulanate] Diarrhea   Erythromycin Nausea And Vomiting   Levaquin [Levofloxacin] Other (See Comments)    Trigger thumb; arthralgias   Nitrofurantoin Nausea And Vomiting   Statins Other (See Comments)    Transient muscle weakness/ myalgias   Azithromycin Nausea And Vomiting, Palpitations and Other (See Comments)    "made my heart speed up"    Review of Systems  Constitutional:  Negative for activity change, appetite change, fatigue and  unexpected weight change.  HENT:  Positive for hearing loss. Negative for trouble swallowing and voice change.   Respiratory:  Negative for shortness of breath and wheezing.   Cardiovascular:  Positive for palpitations. Negative for chest pain.  Gastrointestinal:  Positive for abdominal pain (Reflux).  Genitourinary:  Positive for frequency.  Neurological:  Positive for weakness (See HPI, none currently). Negative for seizures and syncope.  Hematological:  Negative for adenopathy. Does not bruise/bleed easily.  All other systems reviewed and are negative.   BP 119/81 (BP Location: Left Arm, Patient Position: Sitting)   Pulse 83   Resp 18   Ht 5\' 4"  (1.626 m)   Wt 144 lb (65.3 kg)   SpO2 98% Comment: ra  BMI 24.72 kg/m  Physical Exam Vitals reviewed.  Constitutional:      General: She is not in acute distress.    Appearance: Normal appearance.  HENT:     Head: Normocephalic and atraumatic.  Eyes:     General: No scleral icterus.    Extraocular Movements: Extraocular movements intact.  Neck:     Comments: Thyromegaly Cardiovascular:     Rate and Rhythm: Normal rate and regular rhythm.     Heart sounds: Normal heart sounds. No murmur heard. Pulmonary:     Effort: Pulmonary effort is normal. No respiratory distress.     Breath sounds: Normal breath sounds. No wheezing.  Abdominal:     Palpations: Abdomen is soft.  Musculoskeletal:     Cervical back: Neck supple. No rigidity.  Lymphadenopathy:     Cervical: No cervical adenopathy.  Skin:    General: Skin is warm and dry.  Neurological:     General: No focal deficit present.     Mental Status: She is alert and oriented to person, place, and time.  Cranial Nerves: No cranial nerve deficit.     Motor: No weakness.      Diagnostic Tests: CT CHEST WITHOUT CONTRAST   TECHNIQUE: Multidetector CT imaging of the chest was performed following the standard protocol without IV contrast.   RADIATION DOSE REDUCTION: This  exam was performed according to the departmental dose-optimization program which includes automated exposure control, adjustment of the mA and/or kV according to patient size and/or use of iterative reconstruction technique.   COMPARISON:  Radiograph 10/27/2022.  CT 09/11/2008   FINDINGS: Cardiovascular: The heart is upper normal in size. No pericardial effusion. Mild atherosclerosis of the thoracic aorta, no aortic aneurysm.   Mediastinum/Nodes: Heterogeneous anterior mediastinal mass measures 2.8 x 2.0 x 2.5 cm, series 2, image 77. This is soft tissue density and scattered calcifications. Margins are well-circumscribed. No definite enlarged mediastinal lymph nodes. Hilar assessment is limited on this unenhanced exam. Heterogeneous and mildly enlarged thyroid gland. This has been evaluated on previous imaging. (ref: J Am Coll Radiol. 2015 Feb;12(2): 143-50).   Lungs/Pleura: No acute airspace disease. No suspicious pulmonary nodule. No pleural effusion or thickening. Trachea and central airways are clear.   Upper Abdomen: 13 mm water density lesion in the right dome of the liver series 2, image 103, typically cyst. Subcentimeter low-density lesions in the left hepatic lobe series 2, images 123 and 151 no acute upper abdominal findings., too small to characterize.   Musculoskeletal: Mild thoracic scoliosis and spondylosis. There are no acute or suspicious osseous abnormalities. Breast tissue is diffusely lobulated bilaterally.   IMPRESSION: 1. 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 In the setting of myasthenia gravis, suspicious for thymoma. 2. No pulmonary nodule/mass or adenopathy.   Aortic Atherosclerosis (ICD10-I70.0).   These results will be called to the ordering clinician or representative by the Radiologist Assistant, and communication documented in the PACS or [employer].     Electronically Signed   By: Narda Rutherford M.D.    On: 06/30/2023 16:06   Heterogeneous anterior mediastinal mass measuring about 3 x 2 x 2.5 cm consistent with thymoma.  Impression: Caroline Peterson is a 47 year old woman with myasthenia gravis, hypertension, hyperlipidemia, thyroid nodules, mitral insufficiency, HELLP syndrome, arthritis in the neck, reflux, and a recently discovered anterior mediastinal mass.  Anterior mediastinal mass-almost certainly a thymoma given her myasthenia.  Germ cell tumors and lymphoma are also in the differential but are exceedingly unlikely.  She question as to whether this is benign or malignant.  Cannot determine that with any certainty based on the scans.  Does not appear to be invading any surrounding structures.  Risk of recurrence highly dependent on cell type.  I advised her that surgical resection is indicated for the thymoma independent of her myasthenia.  It often will decrease the amount of medication needed to control symptoms and sometimes can induce a complete remission.  Less commonly it can exacerbate symptoms in the short-term.  I discussed the postoperative procedure with Caroline Peterson and her mother.  The plan would be to do a robotic assisted right VATS for thymectomy.  I informed her of the general nature of the procedure including the incisions to be used, the use of the surgical robot, the use of drains to postoperatively, the expected hospital stay, and the overall recovery.  I informed her of the indications, risks, benefits, and alternatives.  She understands the risks include, but not limited to death, MI, DVT, PE, bleeding, possible need for transfusion, infection, cardiac arrhythmia,  injury to recurrent or phrenic nerves, as well as possibility of other unstable complications.  She wishes to think over her options before making a decision.  She will call if she wants to schedule surgery.  Plan: Patient will call to schedule robotic right VATS for thymectomy.  Caroline Slot,  MD [facility] [phone]

## 2023-07-24 ENCOUNTER — Other Ambulatory Visit: Payer: Self-pay | Admitting: *Deleted

## 2023-07-24 DIAGNOSIS — D4989 Neoplasm of unspecified behavior of other specified sites: Secondary | ICD-10-CM

## 2023-07-24 NOTE — Telephone Encounter (Signed)
Referral placed with Pt requested surgeon information

## 2023-08-25 ENCOUNTER — Other Ambulatory Visit: Payer: Self-pay | Admitting: Family Medicine

## 2023-08-29 ENCOUNTER — Other Ambulatory Visit: Payer: Self-pay | Admitting: Cardiology

## 2023-09-10 DIAGNOSIS — Z01818 Encounter for other preprocedural examination: Secondary | ICD-10-CM | POA: Diagnosis not present

## 2023-09-10 DIAGNOSIS — E042 Nontoxic multinodular goiter: Secondary | ICD-10-CM | POA: Diagnosis not present

## 2023-09-10 DIAGNOSIS — I10 Essential (primary) hypertension: Secondary | ICD-10-CM | POA: Diagnosis not present

## 2023-09-10 DIAGNOSIS — G7000 Myasthenia gravis without (acute) exacerbation: Secondary | ICD-10-CM | POA: Diagnosis not present

## 2023-09-10 DIAGNOSIS — J9859 Other diseases of mediastinum, not elsewhere classified: Secondary | ICD-10-CM | POA: Diagnosis not present

## 2023-09-17 ENCOUNTER — Telehealth: Payer: Self-pay | Admitting: Neurology

## 2023-09-17 NOTE — Telephone Encounter (Signed)
Pt called to reschedule appointment due to having surgery.

## 2023-09-18 ENCOUNTER — Encounter: Payer: Self-pay | Admitting: Neurology

## 2023-09-19 ENCOUNTER — Telehealth: Payer: Self-pay

## 2023-09-19 NOTE — Telephone Encounter (Signed)
 Call to patient, scheduled to come in prior to surgery per Dr. Gracie Lav, discuss mediations

## 2023-09-24 ENCOUNTER — Ambulatory Visit (INDEPENDENT_AMBULATORY_CARE_PROVIDER_SITE_OTHER): Payer: 59 | Admitting: Neurology

## 2023-09-24 ENCOUNTER — Other Ambulatory Visit: Payer: Self-pay | Admitting: Family Medicine

## 2023-09-24 ENCOUNTER — Encounter: Payer: Self-pay | Admitting: Neurology

## 2023-09-24 VITALS — BP 117/82 | HR 73 | Ht 63.0 in | Wt 153.0 lb

## 2023-09-24 DIAGNOSIS — D4989 Neoplasm of unspecified behavior of other specified sites: Secondary | ICD-10-CM

## 2023-09-24 DIAGNOSIS — G7000 Myasthenia gravis without (acute) exacerbation: Secondary | ICD-10-CM

## 2023-09-24 NOTE — Progress Notes (Signed)
Chief Complaint  Patient presents with   Follow-up    Rm15, alone, discuss medications/ upcoming surgery tomorrow      ASSESSMENT AND PLAN  Oaklynn Stierwalt is a 48 y.o. 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, pending robotic assistance thymectomy on September 25, 2023 by [facility] thoracic surgeon Dr.Klapper on September 24, 2023,  Her myasthenia gravis is under excellent control, taking continue prednisone 10 mg 2025 since August 05, 2023 and CellCept 500 mg twice a day,  Only use Mestinon 60 mg half tablets 2-3 times a day, could not tolerate 60 mg due to GI side effect,  Examination September 24, 2023 prior to surgery showed excellent muscle strength, only slight bilateral shoulder abduction weakness could due to deconditioning, bulbar and limb muscle trends were otherwise normal, no double vision or ptosis noted  Will follow-up in 2-3 months, may continue prednisone tapering     DIAGNOSTIC DATA (LABS, IMAGING, TESTING) - I reviewed patient records, labs, notes, testing and imaging myself where available.   MEDICAL HISTORY:  Zahniya Zellars, is a 48 year old female seen in request by her primary care doctor [name] for evaluation of weakness, initial evaluation was on October 04, 2020  I reviewed and summarized the referring note. PMHX. Anxiety Iron Deficiency anemia HTN HLD, could not tolerate statin,   Chronic insomnia.  She works as a Facilities manager, was taken to the emergency room by her mother on September 17, 2022, that morning she was ready to work, having backpack on her right shoulder, holding her cell phone, lunch bag, coffee cup by her left hand as she usually does, then she felt left arm weakness to the point of dropping her coffee, she has to  squatting down to put off her load, then fell backwards, landed on the hard floor,  She has been complains of generalized weakness since her statin treatment in September 2023, initially was given Lipitor up to 40 mg, by December 2023, she has developed such weakness to the point of difficulty raising arm overhead to shampoo her hair, but denies significant muscle achy pain, Lipitor were stopped, her symptoms gradually improved  Then since August 28, 2022, she was given Zocor 40 mg, few weeks into that, she noticed recurrent bilateral upper extremity weakness, difficulty raising overhead but not to the previous degree  In addition, she complains of chronic neck pain, radiating pain to her right shoulder, right arm seems to be weaker than the left  Personally reviewed MRI of cervical spine September 17, 2022, degenerative spondylosis C5-6, with endplate osteophyte, towards the right, narrowing of the ventral subarachnoid space, but no compression of the cord, foraminal narrowing on the right, because of the encroachment that osteophyte, bulging disc material,  MRI of the brain, no significant abnormality  Laboratory evaluation in February 2024 showed normal magnesium CPK, CBC showed anemia 11.8, due to heavy menstrual bleeding, normal TSH, BMP showed low calcium 7.9  She has stopped the Zocor 40 mg since September 17, 2022, noticed some improvement of her weakness since  UPDATE May 24 2023: Over the past few weeks she noticed intermittent ptosis, sometimes to the point of blocking left vision, denies double vision, feeling fatigue, denies chewing or swallowing difficulty  But on today's examination, she was noted to have fatigable left ptosis, mild bulbar, mild to  moderate limb muscle weakness,  Acetylcholine receptor antibody was positive, binding 6.71, blocking antibody 64%, modulating antibody 76%, Confirm the diagnosis seropositive generalized myasthenia gravis  Rest of the laboratory  evaluation showed normal or negative HIV, CPK, hepatitis panel, TSH, CMP, CBC, A1c, mild elevation of LDL 118, triglyceride 187  She is tearful at today's visit, just went through divorce, started a new job, under a lot of stress, suffered a stingray injury in summer, was treated with steroid, complains of side effect of bloating, flush with steroid  UPDATE Nov 6th 2024: She has GI side effect with Mestinon 60 mg now taking half tablets twice a day, does help her ptosis and muscle strength, tolerating prednisone, now on 30 mg daily, will continue to taper down to 20 mg for 2 weeks, 50 mg for 2 weeks then stay on 10 mg daily, CellCept 500 mg twice a day  Today's examination showed slight eye closure cheek puff weakness, mild neck flexion, proximal upper extremity weakness  UPDATE Sep 24 2023: She is overall doing very well taking CellCept 500 twice daily, prednisone 10 mg since August 05, 2023, 10 pounds weight gain over past 6 months, mild moon face, no significant extraocular muscle, bulbar or limb muscle weakness noted, no limitation in her daily activity, only use Mestinon occasionally  CT of the chest showed  1. 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 In the setting of myasthenia gravis, suspicious for thymoma. 2. No pulmonary nodule/mass or adenopathy.  Pending robotic assistance thymectomy on September 25, 2023 by [facility] surgeon Dr.Klapper on September 24, 2023,  Was to continue follow-up by [facility] and eye care and [facility] ophthalmologist for mild increased IOP,   PHYSICAL EXAM:   Vitals:   09/24/23 1139  BP: 117/82  Pulse: 73  Weight: 153 lb (69.4 kg)  Height: 5\' 3"  (1.6 m)      Body mass index is 27.1 kg/m.  PHYSICAL EXAMNIATION:  Gen: NAD, conversant, well nourised, well groomed                     Cardiovascular: Regular rate rhythm, no peripheral edema, warm, nontender. Eyes: Conjunctivae clear without exudates or hemorrhage Neck:  Supple, no carotid bruits. Pulmonary: Clear to auscultation bilaterally   NEUROLOGICAL EXAM:  MENTAL STATUS: Speech/cognition: Awake, alert, oriented to history taking and casual conversation CRANIAL NERVES: CN II: Visual fields are full to confrontation. Pupils are round equal and briskly reactive to light. CN III, IV, VI: extraocular movement are full, cover and uncover showed steady extraocular movement, no significant ptosis CN V: Facial sensation is intact to light touch CN VII: Mild eye closure, cheek puff weakness CN VIII: Hearing is normal to causal conversation. CN IX, X: Phonation is normal. CN XI: Head turning and shoulder shrug are intact  MOTOR: Only mild bilateral shoulder abduction weakness, rest muscle group was normal  REFLEXES: Reflexes are 1  and symmetric at the biceps, triceps, knees, and ankles. Plantar responses are flexor.  SENSORY: Intact to light touch, pinprick and vibratory sensation are intact in fingers and toes.  COORDINATION: There is no trunk or limb dysmetria noted.  GAIT/STANCE: Able to get up from sit down, squatting down position without assistant, steady gait  REVIEW OF SYSTEMS:  Full 14 system review of systems performed and notable only for as above All other review of systems were negative.   ALLERGIES: Allergies  Allergen Reactions   Aspirin Nausea And Vomiting    "  can take coated aspirin"   Augmentin [Amoxicillin-Pot Clavulanate] Diarrhea   Erythromycin Nausea And Vomiting   Levaquin [Levofloxacin] Other (See Comments)    Trigger thumb; arthralgias   Nitrofurantoin Nausea And Vomiting   Statins Other (See Comments)    Transient muscle weakness/ myalgias  Muscle weakness   Azithromycin Nausea And Vomiting, Palpitations and Other (See Comments)    "made my heart speed up"    HOME MEDICATIONS: Current Outpatient Medications  Medication Sig Dispense Refill   azelastine (ASTELIN) 0.1 % nasal spray Place 2 sprays into both  nostrils 2 (two) times daily. (Patient taking differently: Place 2 sprays into both nostrils 2 (two) times daily as needed for allergies or rhinitis.) 30 mL 12   benzonatate (TESSALON) 200 MG capsule Take 1 capsule (200 mg total) by mouth 2 (two) times daily as needed for cough. 20 capsule 0   busPIRone (BUSPAR) 10 MG tablet TAKE 1 TABLET BY MOUTH EVERY MORNING AND 2 BY MOUTH EVERY NIGHT AT BEDTIME 90 tablet 2   calcium citrate (CALCITRATE - DOSED IN MG ELEMENTAL CALCIUM) 950 (200 Ca) MG tablet Take 2 tablets by mouth daily with lunch.     Coenzyme Q10-Omega 3 Fatty Acd (COQMAX OMEGA) 50 MG CAPS Take 2 capsules by mouth daily with lunch.     COLLAGEN PO Take by mouth daily.     Glycerin-Hypromellose-PEG 400 (DRY EYE RELIEF DROPS OP) Place 1 drop into both eyes 2 (two) times daily.     hydrocortisone (ANUSOL-HC) 2.5 % rectal cream APPLY RECTALLY TO THE AFFECTED AREA THREE TIMES DAILY (Patient taking differently: Place 1 Application rectally 3 (three) times daily as needed for hemorrhoids.) 30 g 1   LORazepam (ATIVAN) 0.5 MG tablet TAKE 1/2 TABLET(0.25 MG) BY MOUTH DAILY AS NEEDED FOR ANXIETY 30 tablet 5   metoprolol succinate (TOPROL-XL) 50 MG 24 hr tablet TAKE 1 TABLET BY MOUTH TWICE DAILY WITH OR IMMEDIATELY FOLLOWING A MEAL 180 tablet 0   Multiple Vitamin (MULTIVITAMIN) tablet Take 1 tablet by mouth daily with lunch.     mycophenolate (CELLCEPT) 500 MG tablet Take 1 tablet (500 mg total) by mouth 2 (two) times daily. 60 tablet 11   predniSONE (DELTASONE) 10 MG tablet Take 4 tablets (40 mg total) by mouth daily with breakfast. 120 tablet 11   pyridostigmine (MESTINON) 60 MG tablet Take 1 tablet (60 mg total) by mouth 3 (three) times daily. 90 tablet 11   traZODone (DESYREL) 50 MG tablet TAKE 1/2 TO 1 TABLET(25 TO 50 MG) BY MOUTH AT BEDTIME AS NEEDED FOR SLEEP 30 tablet 3   valACYclovir (VALTREX) 500 MG tablet TAKE 1 TABLET(500 MG) BY MOUTH DAILY 90 tablet 3   No current facility-administered  medications for this visit.    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 Jhonnie Garner;  s/p yag laser peripheral iridotomy left eye 2018;  bilateral eye 2019   Arthritis    neck   Complication of anesthesia    per pt had issues post op in PACU with excessive sleepiness and being sedated following sinus surgery , nurse had to to jaw thrust to maintain airway.  anesthesia record in care everywhere date of surgery 08-14-2022 @ [facility]  (in epic pt spoke with one of our anesthesiologist, Dr A. Hodierne MDA, note dated 12-28-2022)   Dry eye syndrome of both eyes    Family history of adverse reaction  to anesthesia    father--- hard to wake   GERD (gastroesophageal reflux disease)    01-25-2023 per pt does not take any meds   History of hemolysis, elevated liver enzymes, and low platelet (HELLP) syndrome    History of MRSA infection    boils on skin (from shaving)   Hypertension    IDA (iron deficiency anemia)    Intracranial atherosclerosis    followed by pcp;  incidental finding on CT/ MRI   Menorrhagia    Multiple thyroid nodules    incidental finding on head CT 2023/  ultrasound 05-11-2022 in [place],  bilateral nodules , no bx , did not meet criteria   Nonrheumatic mitral (valve) insufficiency    Palpitations    cardiologist--- dr Tarkio Lions   Wears glasses     PAST SURGICAL HISTORY: Past Surgical History:  Procedure Laterality Date   CESAREAN SECTION  11/06/2009   per pt w/ general anesthesia   COLONOSCOPY WITH ESOPHAGOGASTRODUODENOSCOPY (EGD)  06/19/2022   dr beavers   DILATION AND CURETTAGE OF UTERUS  09/06/2008   @ [facility];   for POC   DILITATION & CURRETTAGE/HYSTROSCOPY WITH NOVASURE ABLATION N/A 02/13/2023   Procedure: DILATATION & CURETTAGE/HYSTEROSCOPY WITH NOVASURE ABLATION; MYOSURE RESECTION;  Surgeon: Olivia Mackie, MD;  Location: [facility];  Service:  Gynecology;  Laterality: N/A;   NASAL/SINUS ENDOSCOPY  08/14/2022   @ [facility] by dr d. Hollace Hayward;  bilateral resection subucous turbinates,  septoplasty,  right maxillary antrostomy w/ removal tissue   TONSILLECTOMY AND ADENOIDECTOMY  1986   WISDOM TOOTH EXTRACTION  1994   per pt w/ anesthesia    FAMILY HISTORY: Family History  Problem Relation Age of Onset   Hypertension Mother    Hyperlipidemia Mother    Diabetes Father    Hyperlipidemia Father    Hypertension Father    Kidney disease Father    Hypertension Brother    Cancer Maternal Grandmother    Cancer Maternal Grandfather    Cancer Paternal Grandmother    Heart disease Paternal Grandmother    Heart disease Paternal Grandfather    Hypertension Paternal Grandfather    Colon cancer Neg Hx    Esophageal cancer Neg Hx    Liver cancer Neg Hx    Pancreatic cancer Neg Hx    Rectal cancer Neg Hx    Stomach cancer Neg Hx     SOCIAL HISTORY: Social History   Socioeconomic History   Marital status: Divorced    Spouse name: Cia Garretson   Number of children: 1   Years of education: Not on file   Highest education level: Master's degree (e.g., MA, MS, MEng, MEd, MSW, MBA)  Occupational History    Employer: [employer]  Tobacco Use   Smoking status: Never   Smokeless tobacco: Never  Vaping Use   Vaping status: Never Used  Substance and Sexual Activity   Alcohol use: Yes    Comment: occasional   Drug use: Never   Sexual activity: Yes    Birth control/protection: None  Other Topics Concern   Not on file  Social History Narrative   Lives with husband and daughter.    Social Drivers of Corporate investment banker Strain: Low Risk  (07/10/2023)   Overall Financial Resource Strain (CARDIA)    Difficulty of Paying Living Expenses: Not hard at all  Food Insecurity: No Food Insecurity (07/10/2023)   Hunger Vital Sign    Worried About Running Out of Food in the Last  Year: Never true    Ran Out of  Food in the Last Year: Never true  Transportation Needs: No Transportation Needs (07/10/2023)   PRAPARE - Administrator, Civil Service (Medical): No    Lack of Transportation (Non-Medical): No  Physical Activity: Unknown (07/10/2023)   Exercise Vital Sign    Days of Exercise per Week: 0 days    Minutes of Exercise per Session: Not on file  Stress: No Stress Concern Present (07/10/2023)   [employer] of Occupational Health - Occupational Stress Questionnaire    Feeling of Stress : Not at all  Social Connections: Moderately Integrated (07/10/2023)   Social Connection and Isolation Panel [NHANES]    Frequency of Communication with Friends and Family: More than three times a week    Frequency of Social Gatherings with Friends and Family: More than three times a week    Attends Religious Services: More than 4 times per year    Active Member of [employer] or Organizations: Yes    Attends Engineer, structural: More than 4 times per year    Marital Status: Divorced  Intimate Partner Violence: Not on file      Levert Feinstein, M.D. Ph.D.  [facility] [address], [address] [place], [place] [address] Ph: [phone] Fax: [phone]  CC:  Ardith Dark, MD [address] [place],  [place] [address]  Ardith Dark, MD

## 2023-09-25 HISTORY — PX: TOTAL THYMECTOMY: SHX2546

## 2023-09-27 ENCOUNTER — Telehealth: Payer: Self-pay | Admitting: Cardiology

## 2023-09-27 NOTE — Telephone Encounter (Signed)
Pt is currently still in the hospital after having surgery, being discharged today but was told to call cardiology due to bp running low 73/56, 100/57 and wants to know if she should come in for f/u, requesting cb

## 2023-09-27 NOTE — Telephone Encounter (Signed)
Attempted to call patient, no answer, left message requesting callback.

## 2023-09-30 NOTE — Telephone Encounter (Signed)
 Patient identification verified by 2 forms. Marilynn Rail, RN    Called and spoke to patient  Patient states:   -since discharge, BP has increased   -has resumed BP medications   -did noticed HR increased to 116, no with activity   -HR has decreased since resuming BP medications   -does not have a BP log  Advised patient:   -keep BP log   -follow up at 4/3 OV with Dr. Antoine Poche  Reviewed ED warning signs/precautions  Patient verbalized understanding, no questions at this time

## 2023-10-01 ENCOUNTER — Ambulatory Visit: Payer: 59 | Admitting: Family Medicine

## 2023-10-01 ENCOUNTER — Ambulatory Visit: Payer: BC Managed Care – PPO | Admitting: Neurology

## 2023-10-01 ENCOUNTER — Encounter: Payer: Self-pay | Admitting: Family Medicine

## 2023-10-01 VITALS — BP 130/84 | HR 69 | Temp 97.3°F | Ht 63.0 in | Wt 153.2 lb

## 2023-10-01 DIAGNOSIS — R040 Epistaxis: Secondary | ICD-10-CM

## 2023-10-01 DIAGNOSIS — G7000 Myasthenia gravis without (acute) exacerbation: Secondary | ICD-10-CM | POA: Diagnosis not present

## 2023-10-01 DIAGNOSIS — R21 Rash and other nonspecific skin eruption: Secondary | ICD-10-CM

## 2023-10-01 DIAGNOSIS — I159 Secondary hypertension, unspecified: Secondary | ICD-10-CM

## 2023-10-01 DIAGNOSIS — D4989 Neoplasm of unspecified behavior of other specified sites: Secondary | ICD-10-CM

## 2023-10-01 MED ORDER — TRIAMCINOLONE ACETONIDE 0.5 % EX OINT: 1.0000 | TOPICAL_OINTMENT | Freq: Two times a day (BID) | CUTANEOUS | 0 refills | Status: DC

## 2023-10-01 NOTE — Progress Notes (Signed)
   Caroline Peterson is a 48 y.o. female who presents today for an office visit.  Assessment/Plan:  New/Acute Problems: Rash No red flags.  Likely delayed hypersensitivity reaction or contact dermatitis related to her recent hospitalization a week ago.  She has not had any other new exposures.  Zoster outbreak is also a consideration though has no obvious vesicles on exam today.  We will start topical triamcinolone.  She will let us know if not improving in the next couple of days or if she develops any vesicles and we can start high-dose Valtrex.  We discussed reasons to return to care.  Epistaxis No red flags.  Small amount of dried blood in right anterior nostril today.  Recommended intranasal saline.  She will let us know if this continues to be an issue and we can consider an office cauterization versus referral to ENT.  Chronic Problems Addressed Today: Hypertension She does have some lows after her surgery though now she is back at goal.  She has done well with restarting her metoprolol succinate 50 mg daily.  Will continue this for now.  Her lows after surgery will likely due to side effect of her anesthesia.  She will continue monitor at home and let us know if she has any lows or persistent elevations.  Thymoma Doing well status post thymectomy.  Continue management per cardiothoracic surgery.  Myasthenia gravis (HCC) Overall symptoms are stable.  She is on pyridostigmine prednisone, and CellCept.  Will place referral for her to establish with [facility] neurology.     Subjective:  HPI:  Patient here for follow-up.  Last saw her a few months ago.  Since her last visit she underwent thymectomy at [facility] 6 days ago. She stayed in the hospital for 2 days afterwards. She was noted to have low blood pressure after her surgery and they discontinued her metoprolol succinate. Since being home she has had more palpitations and restarted her metoprolol. She has felt fine since being  back on this.  Blood pressures at home have been at goal.  She did develop a rash on her chest yesterday. This initially started as a small red area that is increasing in size.  A lot of itching.  No treatments tried.  No obvious exposures aside from being in the hospital last week as above.  She has also had a few nosebleeds the last week or so as well.  Located on the right side of her nose.  Mild and usually improve after a few minutes.         Objective:  Physical Exam: BP 130/84   Pulse 69   Temp (!) 97.3 F (36.3 C) (Temporal)   Ht 5\' 3"  (1.6 m)   Wt 153 lb 3.2 oz (69.5 kg)   SpO2 97%   BMI 27.14 kg/m   Gen: No acute distress, resting comfortably HEENT: Dried blood noted at anterior septum in right nostril. CV: Regular rate and rhythm with no murmurs appreciated Pulm: Normal work of breathing, clear to auscultation bilaterally with no crackles, wheezes, or rhonchi Skin: Erythematous rash approximately 2 to 3 cm in diameter on the mid chest.  See below picture. Neuro: Grossly normal, moves all extremities Psych: Normal affect and thought content        Faryal Marxen M. [name], MD 10/01/2023 11:24 AM

## 2023-10-01 NOTE — Patient Instructions (Addendum)
 It was very nice to see you today!  I think you have a delayed allergic reaction to something that you were exposed to during your surgery or hospitalization.  Use the triamcinolone.  It is possible that this could be shingles.  Please let us know if your symptoms are not improving that the rash and we will start on a higher dose of Valtrex.  Please use nasal saline for your nosebleeds.  That is okay for you to stay on your metoprolol.  Return if symptoms worsen or fail to improve.   Take care, Dr [name]  PLEASE NOTE:  If you had any lab tests, please let us know if you have not heard back within a few days. You may see your results on mychart before we have a chance to review them but we will give you a call once they are reviewed by [place].   If we ordered any referrals today, please let us know if you have not heard from their office within the next week.   If you had any urgent prescriptions sent in today, please check with the pharmacy within an hour of our visit to make sure the prescription was transmitted appropriately.   Please try these tips to maintain a healthy lifestyle:  Eat at least 3 REAL meals and 1-2 snacks per day.  Aim for no more than 5 hours between eating.  If you eat breakfast, please do so within one hour of getting up.   Each meal should contain half fruits/vegetables, one quarter protein, and one quarter carbs (no bigger than a computer mouse)  Cut down on sweet beverages. This includes juice, soda, and sweet tea.   Drink at least 1 glass of water with each meal and aim for at least 8 glasses per day  Exercise at least 150 minutes every week.

## 2023-10-01 NOTE — Assessment & Plan Note (Signed)
 She does have some lows after her surgery though now she is back at goal.  She has done well with restarting her metoprolol succinate 50 mg daily.  Will continue this for now.  Her lows after surgery will likely due to side effect of her anesthesia.  She will continue monitor at home and let us know if she has any lows or persistent elevations.

## 2023-10-01 NOTE — Assessment & Plan Note (Signed)
 Doing well status post thymectomy.  Continue management per cardiothoracic surgery.

## 2023-10-01 NOTE — Assessment & Plan Note (Signed)
 Overall symptoms are stable.  She is on pyridostigmine prednisone, and CellCept.  Will place referral for her to establish with [facility] neurology.

## 2023-10-02 ENCOUNTER — Telehealth: Payer: Self-pay | Admitting: *Deleted

## 2023-10-02 NOTE — Telephone Encounter (Signed)
 Copied from CRM [id number]. Topic: Referral - Question >> Oct 01, 2023  2:21 PM Florestine Avers wrote: Reason for CRM: Joni Reining called from [facility] stating that about a referral. Joni Reining states that when she contacted the patient, the patient informed her that she is currently being followed by Dr. Terrace Arabia for neurology. Joni Reining is requesting a call back for clarity on the referral, please contact her at [phone].   Please advise,RMA

## 2023-10-04 NOTE — Telephone Encounter (Signed)
 She wishes to transfer care to [facility] neurology for management for her Myasthenia.

## 2023-11-11 NOTE — Progress Notes (Unsigned)
 Cardiology Office Note:   Date:  11/14/2023  ID:  Caroline Peterson, DOB [birth year], MRN [id number] PCP: Ardith Dark, MD  [facility] Providers Cardiologist:  Rollene Rotunda, MD {  History of Present Illness:   Caroline Peterson is a 48 y.o. female who presents for follow up of palpitations.   I saw her last in 2024.   She had started some statins because an MRI of her brain demonstrated some calcifications.  This was in October.  She was on Lipitor weakness.  She was to start Zocor but prior to that she had nasal surgery and she had a lot of trouble with anesthesia and lots of weakness.  This was immediately after surgery.  She did later start Zocor and in February had an episode of diffuse left-sided weakness.  She went to the emergency room.  She had a negative workup.  She saw neurology.  Zocor was stopped at that point in time.  The weakness was progressively worse.    She has subsequently been found to have myasthenia gravis and actually has had robotic resection of a thymoma at [facility].  She is currently on steroids and CellCept.  She is done okay from a cardiovascular standpoint although she occasionally gets some palpitations these are not particularly problematic.  She is not having any chest pressure, neck or arm discomfort.  She is not having any new shortness of breath, PND or orthopnea.  She has weight gain and edema.  This is related to the steroids.  ROS: As stated in the HPI and negative for all other systems.  Studies Reviewed:    EKG:   EKG Interpretation Date/Time:  Thursday November 14 2023 11:30:31 EDT Ventricular Rate:  68 PR Interval:  140 QRS Duration:  78 QT Interval:  410 QTC Calculation: 435 R Axis:   70  Text Interpretation: Normal sinus rhythm Normal ECG When compared with ECG of 17-Sep-2022 08:18, No significant change since last tracing Confirmed by Rollene Rotunda ([id number]) on 11/14/2023 11:55:58 AM    Risk  Assessment/Calculations:          Physical Exam:   VS:  BP 120/70 (BP Location: Left Arm, Patient Position: Sitting, Cuff Size: Normal)   Pulse 68   Ht 5\' 3"  (1.6 m)   Wt 155 lb (70.3 kg)   BMI 27.46 kg/m    Wt Readings from Last 3 Encounters:  11/14/23 155 lb (70.3 kg)  11/12/23 156 lb 9.6 oz (71 kg)  10/01/23 153 lb 3.2 oz (69.5 kg)     GEN: Well nourished, well developed in no acute distress NECK: No JVD; No carotid bruits CARDIAC: RRR, no murmurs, rubs, gallops RESPIRATORY:  Clear to auscultation without rales, wheezing or rhonchi  ABDOMEN: Soft, non-tender, non-distended EXTREMITIES:  No edema; No deformity   ASSESSMENT AND PLAN:   Palpitations - These are not particularly problematic.  We discussed perhaps taking short acting blocker in addition to what she is on but she does not want to try this.  She will let me know if it gets worse.   Mitral regurgitation - She had previous history of this but no murmur.  No further work up.    HTN - Her blood pressure is at target.  It was low for a while she said she was taken off metoprolol to have palpitations.  She is not tolerating the current dose. No change in therapy.   Dyslipidemia - She does have aortic atherosclerosis on her  CT at a young age.  There was no coronary calcium noted.  I think she does need lipid management if she tolerates medicines but cannot take a statin.  I will start with Zetia 10 mg daily.  Her LDL was only 118 in August of last year.  We will repeat a lipid profile in 3 months.  There is no contraindication reported to Zetia in patients with myasthenia gravis.  Follow up with me in 12 month.   Signed, Rollene Rotunda, MD

## 2023-11-12 ENCOUNTER — Encounter: Payer: Self-pay | Admitting: Family Medicine

## 2023-11-12 ENCOUNTER — Ambulatory Visit: Admitting: Family Medicine

## 2023-11-12 ENCOUNTER — Ambulatory Visit: Payer: Self-pay

## 2023-11-12 VITALS — BP 124/76 | HR 68 | Temp 98.0°F | Ht 63.0 in | Wt 156.6 lb

## 2023-11-12 DIAGNOSIS — H60501 Unspecified acute noninfective otitis externa, right ear: Secondary | ICD-10-CM

## 2023-11-12 MED ORDER — NEOMYCIN-POLYMYXIN-HC 3.5-10000-1 OT SOLN: 3.0000 [drp] | Freq: Three times a day (TID) | OTIC | 0 refills | Status: DC

## 2023-11-12 NOTE — Telephone Encounter (Signed)
 Noted.

## 2023-11-12 NOTE — Telephone Encounter (Signed)
  Chief Complaint: right ear pain/swelling Symptoms: right ear pain and swelling with drainage Frequency: started in February, worsened yesterday Pertinent Negatives: Patient denies fever, facial swelling, dizziness/unsteady, visual changes Disposition: [] ED /[] Urgent Care (no appt availability in office) / [x] Appointment(In office/virtual)/ []  [facility]/ [] Home Care/ [] Refused Recommended Disposition /[] [facility]/ []  Follow-up with PCP Additional Notes: Patient states she had surgery 2/12 at [facility] and has had earaches off and on since then (she states she thought it was nerve pain from the surgery). She states yesterday she noticed the swelling and has been up since 0200 this morning. She states on Saturday there was right sided tongue pain. Offered patient available appts with PCP tomorrow, refused and states she would prefer to be seen today. Patient agreeable to morning appt with Dr Veto Kemps at [facility].  Copied from CRM [id number]. Topic: Clinical - Red Word Triage >> Nov 12, 2023  8:18 AM Fonda Kinder J wrote: Red Word that prompted transfer to Nurse Triage: Severe ear pain/ear swollen shut: Right ear Reason for Disposition  Earache  (Exceptions: brief ear pain of < 60 minutes duration, earache occurring during air travel  Answer Assessment - Initial Assessment Questions 1. LOCATION: "Which ear is involved?"     Right.  2. ONSET: "When did the ear start hurting"      Yesterday noticed swelling and pain worsened since 0200 this morning.  3. SEVERITY: "How bad is the pain?"  (Scale 1-10; mild, moderate or severe)   - MILD (1-3): doesn't interfere with normal activities    - MODERATE (4-7): interferes with normal activities or awakens from sleep    - SEVERE (8-10): excruciating pain, unable to do any normal activities      4/10, denies taking any pain medication.  4. URI SYMPTOMS: "Do you have a runny nose or cough?"     Nasal congestion.  5. FEVER: "Do you  have a fever?" If Yes, ask: "What is your temperature, how was it measured, and when did it start?"     Denies.  6. CAUSE: "Have you been swimming recently?", "How often do you use Q-TIPS?", "Have you had any recent air travel or scuba diving?"     Denies.  7. OTHER SYMPTOMS: "Do you have any other symptoms?" (e.g., headache, stiff neck, dizziness, vomiting, runny nose, decreased hearing)     She states there is a itchy/tickling and wet feeling.  8. PREGNANCY: "Is there any chance you are pregnant?" "When was your last menstrual period?"     N/A.  Protocols used: Davina Poke

## 2023-11-12 NOTE — Progress Notes (Signed)
 [facility] [address] [place] [address] Dept: [phone] Dept Fax: [phone]  Office Visit  Subjective:    Patient ID: Caroline Peterson, female    DOB: [birth year], 48 y.o..   MRN: [id number]  Chief Complaint  Patient presents with   Ear Pain    C/o having RT ear swelling/pain x 1 day.    History of Present Illness:  Patient is in today with swelling and pain in the right external ear canal over the past day. She had some pain in this ear about 6 weeks ago, after a thymectomy secondary to a thymoma and myasthenia gravis. This resolved quickly with tramadol. She is not noting any hearing loss or ringing. She awakened with pain early this morning and was unable to get back to sleep.  Past Medical History: Patient Active Problem List   Diagnosis Date Noted   Thymoma 07/01/2023   Myasthenia gravis (HCC) 05/24/2023   Statin intolerance 11/14/2022   Dysmenorrhea 05/31/2022   Intracranial atherosclerosis 05/09/2022   Iron deficiency 05/09/2022   Adjustment disorder 03/14/2022   Dyslipidemia 02/28/2021   Other fatigue 10/05/2020   Hearing loss 09/19/2020   Lipoma 06/10/2020   Allergic rhinitis 11/09/2019   Insomnia 11/09/2019   Cold sore 11/09/2019   Osteoarthritis 11/09/2019   Eye abnormality 11/09/2019   Panic attacks 04/09/2019   Palpitation 02/17/2013   Hypertension 11/22/2010   Past Surgical History:  Procedure Laterality Date   CESAREAN SECTION  11/06/2009   per pt w/ general anesthesia   COLONOSCOPY WITH ESOPHAGOGASTRODUODENOSCOPY (EGD)  06/19/2022   dr beavers   DILATION AND CURETTAGE OF UTERUS  09/06/2008   @ [facility];   for POC   DILITATION & CURRETTAGE/HYSTROSCOPY WITH NOVASURE ABLATION N/A 02/13/2023   Procedure: DILATATION & CURETTAGE/HYSTEROSCOPY WITH NOVASURE ABLATION; MYOSURE RESECTION;  Surgeon: Olivia Mackie, MD;  Location: [facility];  Service: Gynecology;   Laterality: N/A;   NASAL/SINUS ENDOSCOPY  08/14/2022   @ [facility] by dr d. Hollace Hayward;  bilateral resection subucous turbinates,  septoplasty,  right maxillary antrostomy w/ removal tissue   TONSILLECTOMY AND ADENOIDECTOMY  1986   TOTAL THYMECTOMY  09/25/2023   WISDOM TOOTH EXTRACTION  1994   per pt w/ anesthesia   Family History  Problem Relation Age of Onset   Hypertension Mother    Hyperlipidemia Mother    Diabetes Father    Hyperlipidemia Father    Hypertension Father    Kidney disease Father    Hypertension Brother    Cancer Maternal Grandmother    Cancer Maternal Grandfather    Cancer Paternal Grandmother    Heart disease Paternal Grandmother    Heart disease Paternal Grandfather    Hypertension Paternal Grandfather    Colon cancer Neg Hx    Esophageal cancer Neg Hx    Liver cancer Neg Hx    Pancreatic cancer Neg Hx    Rectal cancer Neg Hx    Stomach cancer Neg Hx    Outpatient Medications Prior to Visit  Medication Sig Dispense Refill   azelastine (ASTELIN) 0.1 % nasal spray Place 2 sprays into both nostrils 2 (two) times daily. (Patient taking differently: Place 2 sprays into both nostrils 2 (two) times daily as needed for allergies or rhinitis.) 30 mL 12   busPIRone (BUSPAR) 10 MG tablet TAKE 1 TABLET BY MOUTH EVERY MORNING AND 2 BY MOUTH EVERY NIGHT AT BEDTIME 90 tablet 2   calcium citrate (CALCITRATE - DOSED IN MG  ELEMENTAL CALCIUM) 950 (200 Ca) MG tablet Take 2 tablets by mouth daily with lunch.     Coenzyme Q10-Omega 3 Fatty Acd (COQMAX OMEGA) 50 MG CAPS Take 2 capsules by mouth daily with lunch.     COLLAGEN PO Take by mouth daily.     Glycerin-Hypromellose-PEG 400 (DRY EYE RELIEF DROPS OP) Place 1 drop into both eyes 2 (two) times daily.     hydrocortisone (ANUSOL-HC) 2.5 % rectal cream APPLY RECTALLY TO THE AFFECTED AREA THREE TIMES DAILY (Patient taking differently: Place 1 Application rectally 3 (three) times daily as needed for hemorrhoids.) 30 g 1    metoprolol succinate (TOPROL-XL) 50 MG 24 hr tablet TAKE 1 TABLET BY MOUTH TWICE DAILY WITH OR IMMEDIATELY FOLLOWING A MEAL 180 tablet 0   Multiple Vitamin (MULTIVITAMIN) tablet Take 1 tablet by mouth daily with lunch.     mycophenolate (CELLCEPT) 500 MG tablet Take 1 tablet (500 mg total) by mouth 2 (two) times daily. 60 tablet 11   predniSONE (DELTASONE) 10 MG tablet Take 4 tablets (40 mg total) by mouth daily with breakfast. 120 tablet 11   pyridostigmine (MESTINON) 60 MG tablet Take 1 tablet (60 mg total) by mouth 3 (three) times daily. 90 tablet 11   traZODone (DESYREL) 50 MG tablet TAKE 1/2 TO 1 TABLET(25 TO 50 MG) BY MOUTH AT BEDTIME AS NEEDED FOR SLEEP 30 tablet 3   triamcinolone ointment (KENALOG) 0.5 % Apply 1 Application topically 2 (two) times daily. 30 g 0   valACYclovir (VALTREX) 500 MG tablet TAKE 1 TABLET(500 MG) BY MOUTH DAILY 90 tablet 3   No facility-administered medications prior to visit.   Allergies  Allergen Reactions   Aspirin Nausea And Vomiting    "can take coated aspirin"   Augmentin [Amoxicillin-Pot Clavulanate] Diarrhea   Erythromycin Nausea And Vomiting   Levaquin [Levofloxacin] Other (See Comments)    Trigger thumb; arthralgias   Nitrofurantoin Nausea And Vomiting   Statins Other (See Comments)    Transient muscle weakness/ myalgias  Muscle weakness   Azithromycin Nausea And Vomiting, Palpitations and Other (See Comments)    "made my heart speed up"     Objective:   Today's Vitals   11/12/23 1035  BP: 124/76  Pulse: 68  Temp: 98 F (36.7 C)  TempSrc: Temporal  SpO2: 99%  Weight: 156 lb 9.6 oz (71 kg)  Height: 5\' 3"  (1.6 m)   Body mass index is 27.74 kg/m.   General: Well developed, well nourished. No acute distress. HEENT: Normocephalic, non-traumatic. Right external ear normal. Mild pain with manipulation of the   pinna. Right EAC mildly swollen. TM normal . Psych: Alert and oriented. Normal mood and affect.  There are no preventive care  reminders to display for this patient.    Assessment & Plan:   Problem List Items Addressed This Visit   None Visit Diagnoses       Acute otitis externa of right ear, unspecified type    -  Primary   I will prescribe Cortisporin drops for use TID over the next 5 days.   Relevant Medications   neomycin-polymyxin-hydrocortisone (CORTISPORIN) OTIC solution       Return if symptoms worsen or fail to improve.   Loyola Mast, MD

## 2023-11-14 ENCOUNTER — Ambulatory Visit: Payer: Federal, State, Local not specified - PPO | Attending: Cardiology | Admitting: Cardiology

## 2023-11-14 ENCOUNTER — Encounter: Payer: Self-pay | Admitting: Cardiology

## 2023-11-14 VITALS — BP 120/70 | HR 68 | Ht 63.0 in | Wt 155.0 lb

## 2023-11-14 DIAGNOSIS — R002 Palpitations: Secondary | ICD-10-CM | POA: Diagnosis not present

## 2023-11-14 DIAGNOSIS — I10 Essential (primary) hypertension: Secondary | ICD-10-CM | POA: Diagnosis not present

## 2023-11-14 DIAGNOSIS — E785 Hyperlipidemia, unspecified: Secondary | ICD-10-CM

## 2023-11-14 DIAGNOSIS — I340 Nonrheumatic mitral (valve) insufficiency: Secondary | ICD-10-CM

## 2023-11-14 MED ORDER — EZETIMIBE 10 MG PO TABS: 10.0000 mg | ORAL_TABLET | Freq: Every day | ORAL | 3 refills | Status: AC

## 2023-11-14 NOTE — Patient Instructions (Signed)
 Medication Instructions:  Start Zetia 10 mg daily. New script sent to [facility] home [facility]. *If you need a refill on your cardiac medications before your next appointment, please call your pharmacy*  Lab Work: Fasting Lipid profile in 3 months. No appointment needed. Nothing to eat/drink the night before.  If you have labs (blood work) drawn today and your tests are completely normal, you will receive your results only by: MyChart Message (if you have MyChart) OR A paper copy in the mail If you have any lab test that is abnormal or we need to change your treatment, we will call you to review the results.  Follow-Up: At [facility], you and your health needs are our priority.  As part of our continuing mission to provide you with exceptional heart care, our providers are all part of one team.  This team includes your primary Cardiologist (physician) and Advanced Practice Providers or APPs (Physician Assistants and Nurse Practitioners) who all work together to provide you with the care you need, when you need it.  Your next appointment:   1 year(s)  Provider:   Rollene Rotunda, MD     We recommend signing up for the patient portal called "MyChart".  Sign up information is provided on this After Visit Summary.  MyChart is used to connect with patients for Virtual Visits (Telemedicine).  Patients are able to view lab/test results, encounter notes, upcoming appointments, etc.  Non-urgent messages can be sent to your provider as well.   To learn more about what you can do with MyChart, go to [email or web address].   Other Instructions       1st Floor: - Lobby - Registration  - Pharmacy  - Lab - Cafe  2nd Floor: - PV Lab - Diagnostic Testing (echo, CT, nuclear med)  3rd Floor: - Vacant  4th Floor: - TCTS (cardiothoracic surgery) - AFib Clinic - Structural Heart Clinic - Vascular Surgery  - Vascular Ultrasound  5th Floor: - HeartCare Cardiology (general and  EP) - Clinical Pharmacy for coumadin, hypertension, lipid, weight-loss medications, and med management appointments    Valet parking services will be available as well.

## 2023-11-25 ENCOUNTER — Telehealth: Payer: Self-pay | Admitting: Neurology

## 2023-11-25 MED ORDER — MYCOPHENOLATE MOFETIL 500 MG PO TABS: 500.0000 mg | ORAL_TABLET | Freq: Two times a day (BID) | ORAL | 11 refills | Status: AC

## 2023-11-25 NOTE — Telephone Encounter (Signed)
 Sent cellcept as requested to [employer] as requested. Pt voiced gratitude and understanding.

## 2023-11-25 NOTE — Telephone Encounter (Signed)
 Pt called stating that she is needing to [facility] [place] [place] due to [place]. Pt is on her last 3 and is needing this change as soon as possible.

## 2023-12-16 ENCOUNTER — Ambulatory Visit: Payer: BC Managed Care – PPO | Admitting: Neurology

## 2023-12-17 ENCOUNTER — Telehealth: Payer: Self-pay | Admitting: Neurology

## 2023-12-17 NOTE — Telephone Encounter (Signed)
 request to cancel appointment, pt states no longer needed

## 2023-12-31 ENCOUNTER — Ambulatory Visit: Payer: 59 | Admitting: Neurology

## 2024-03-18 ENCOUNTER — Ambulatory Visit (INDEPENDENT_AMBULATORY_CARE_PROVIDER_SITE_OTHER): Payer: BC Managed Care – PPO | Admitting: Family Medicine

## 2024-03-18 ENCOUNTER — Encounter: Payer: Self-pay | Admitting: Family Medicine

## 2024-03-18 VITALS — BP 100/66 | HR 62 | Temp 97.2°F | Ht 63.0 in | Wt 164.8 lb

## 2024-03-18 DIAGNOSIS — G7000 Myasthenia gravis without (acute) exacerbation: Secondary | ICD-10-CM

## 2024-03-18 DIAGNOSIS — Z0001 Encounter for general adult medical examination with abnormal findings: Secondary | ICD-10-CM | POA: Diagnosis not present

## 2024-03-18 DIAGNOSIS — G4700 Insomnia, unspecified: Secondary | ICD-10-CM

## 2024-03-18 DIAGNOSIS — E785 Hyperlipidemia, unspecified: Secondary | ICD-10-CM | POA: Diagnosis not present

## 2024-03-18 DIAGNOSIS — N946 Dysmenorrhea, unspecified: Secondary | ICD-10-CM

## 2024-03-18 DIAGNOSIS — Z131 Encounter for screening for diabetes mellitus: Secondary | ICD-10-CM | POA: Diagnosis not present

## 2024-03-18 DIAGNOSIS — F4329 Adjustment disorder with other symptoms: Secondary | ICD-10-CM

## 2024-03-18 DIAGNOSIS — B001 Herpesviral vesicular dermatitis: Secondary | ICD-10-CM

## 2024-03-18 DIAGNOSIS — I159 Secondary hypertension, unspecified: Secondary | ICD-10-CM

## 2024-03-18 DIAGNOSIS — J309 Allergic rhinitis, unspecified: Secondary | ICD-10-CM

## 2024-03-18 LAB — TSH: TSH: 1.46 u[IU]/mL (ref 0.35–5.50)

## 2024-03-18 LAB — COMPREHENSIVE METABOLIC PANEL WITH GFR
ALT: 20 U/L (ref 0–35)
AST: 18 U/L (ref 0–37)
Albumin: 3.9 g/dL (ref 3.5–5.2)
Alkaline Phosphatase: 60 U/L (ref 39–117)
BUN: 12 mg/dL (ref 6–23)
CO2: 29 meq/L (ref 19–32)
Calcium: 8.6 mg/dL (ref 8.4–10.5)
Chloride: 103 meq/L (ref 96–112)
Creatinine, Ser: 0.62 mg/dL (ref 0.40–1.20)
GFR: 104.79 mL/min (ref >60.00)
Glucose, Bld: 86 mg/dL (ref 70–99)
Potassium: 3.5 meq/L (ref 3.5–5.1)
Sodium: 139 meq/L (ref 135–145)
Total Bilirubin: 0.7 mg/dL (ref 0.2–1.2)
Total Protein: 6.6 g/dL (ref 6.0–8.3)

## 2024-03-18 LAB — LIPID PANEL
Cholesterol: 186 mg/dL (ref 0–200)
HDL: 57.6 mg/dL (ref >39.00)
LDL Cholesterol: 88 mg/dL (ref 0–99)
NonHDL: 128.88
Total CHOL/HDL Ratio: 3
Triglycerides: 203 mg/dL — ABNORMAL HIGH (ref 0.0–149.0)
VLDL: 40.6 mg/dL — ABNORMAL HIGH (ref 0.0–40.0)

## 2024-03-18 LAB — CBC
HCT: 41.1 % (ref 36.0–46.0)
Hemoglobin: 13.6 g/dL (ref 12.0–15.0)
MCHC: 33 g/dL (ref 30.0–36.0)
MCV: 96.7 fl (ref 78.0–100.0)
Platelets: 228 K/uL (ref 150.0–400.0)
RBC: 4.25 Mil/uL (ref 3.87–5.11)
RDW: 13.5 % (ref 11.5–15.5)
WBC: 6.3 K/uL (ref 4.0–10.5)

## 2024-03-18 LAB — HEMOGLOBIN A1C: Hgb A1c MFr Bld: 5.4 % (ref 4.6–6.5)

## 2024-03-18 MED ORDER — MONTELUKAST SODIUM 10 MG PO TABS: 10.0000 mg | ORAL_TABLET | Freq: Every day | ORAL | 3 refills | Status: DC

## 2024-03-18 MED ORDER — VALACYCLOVIR HCL 500 MG PO TABS: 500.0000 mg | ORAL_TABLET | Freq: Every day | ORAL | 3 refills | Status: AC

## 2024-03-18 NOTE — Assessment & Plan Note (Signed)
 Check lipids.  On Zetia  10 mg daily.  Tolerating well.

## 2024-03-18 NOTE — Assessment & Plan Note (Addendum)
 Overall symptoms are stable.  She is currently on prednisone  and CellCept  per [facility] neurology.  Discussed importance of avoiding antihistamines.

## 2024-03-18 NOTE — Assessment & Plan Note (Signed)
 Blood pressure is at goal today on metoprolol  succinate 50 mg daily.

## 2024-03-18 NOTE — Assessment & Plan Note (Signed)
 Stable on Valtrex  5 mg daily.  Will refill today.

## 2024-03-18 NOTE — Assessment & Plan Note (Signed)
Stable on trazodone 25 to 50 mg nightly.

## 2024-03-18 NOTE — Assessment & Plan Note (Signed)
 Stable on BuSpar  10 mg twice daily.

## 2024-03-18 NOTE — Progress Notes (Signed)
 Chief Complaint:  Caroline Peterson is a 48 y.o. female who presents today for her annual comprehensive physical exam.    Assessment/Plan:  New/Acute Problems: Scalp Wound  Patient had area excised and cauterized several weeks ago with dermatology.  Recurrent wound shows no red flags.  Slightly prolonged healing likely due to prednisone  use.  Anticipate that this will continue to heal over the next few weeks.  We discussed reasons to return to care.  Chronic Problems Addressed Today: Hypertension Blood pressure is at goal today on metoprolol  succinate 50 mg daily.  Myasthenia gravis (HCC) Overall symptoms are stable.  She is currently on prednisone  and CellCept  per [facility] neurology.  Discussed importance of avoiding antihistamines.  Dysmenorrhea Continue management per GYN.  They are considering repeat D&C versus starting progesterone.  Dyslipidemia Check lipids.  On Zetia  10 mg daily.  Tolerating well.  Cold sore Stable on Valtrex  5 mg daily.  Will refill today.  Allergic rhinitis Symptoms have flared up recently.  We need to avoid antihistamines due to her myasthenia.  Recommended over-the-counter Flonase.  Will also send a prescription in for Singulair .  She will let us  know if not improving and would consider referral to allergist or ENT.  Insomnia Stable on trazodone  25 to 50 mg nightly.  Adjustment disorder Stable on BuSpar  10 mg twice daily.  Preventative Healthcare: Check labs.  Follows with gynecology for women's health.  Discussed importance of yearly flu vaccine especially in light of her myasthenia.  Up-to-date on colon cancer screening.  Patient Counseling(The following topics were reviewed and/or handout was given):  -Nutrition: Stressed importance of moderation in sodium/caffeine intake, saturated fat and cholesterol, caloric balance, sufficient intake of fresh fruits, vegetables, and fiber.  -Stressed the importance of regular exercise.    -Substance Abuse: Discussed cessation/primary prevention of tobacco, alcohol, or other drug use; driving or other dangerous activities under the influence; availability of treatment for abuse.   -Injury prevention: Discussed safety belts, safety helmets, smoke detector, smoking near bedding or upholstery.   -Sexuality: Discussed sexually transmitted diseases, partner selection, use of condoms, avoidance of unintended pregnancy and contraceptive alternatives.   -Dental health: Discussed importance of regular tooth brushing, flossing, and dental visits.  -Health maintenance and immunizations reviewed. Please refer to Health maintenance section.  Return to care in 1 year for next preventative visit.     Subjective:  HPI:  She has no acute complaints today. Patient is here today for his annual physical.  See assessment / plan for status of chronic conditions.  I last saw her about 6 months ago.  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  Discussed the use of AI scribe software for clinical note transcription with the patient, who gave verbal consent to proceed.  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  She has been managing myasthenia gravis following thymoma removal, which has alleviated her symptoms. She is currently on prednisone , which she is taking at 7.5 mg, and CellCept , taking two tablets at night and one in the morning. She hopes to taper the prednisone  to 5 mg after her next neurology visit. She has not taken pyridostigmine  since her surgery, except once. No current symptoms of myasthenia gravis are reported.  She underwent an endometrial biopsy and ablation last year. Recently, she was found to have  a distorted, thickened endometrial lining with another polyp. Her gynecologist discussed possible  treatment options, including progesterone, a D&C, or hysterectomy, but she is seeking a second opinion before proceeding.  She experiences nasal drainage and has not taken any medication for it due to concerns about interactions with her myasthenia gravis. She has used Flonase in the past and is considering trying Singulair  again. She uses a nasal gel for sores in her nose, which are not fever blisters but something similar.  A benign lesion was removed from her head about a month ago, which continues to drain. She is concerned about the healing process, noting that the prednisone  may be affecting wound healing.  She is running low on Valtrex  and may need a refill. She also takes Zetia  for cholesterol management. She reports dizziness, possibly due to water in her ears, but it has improved over the last two days.       03/18/2024    8:26 AM  Depression screen PHQ 2/9  Decreased Interest 0  Down, Depressed, Hopeless 0  PHQ - 2 Score 0    Health Maintenance Due  Topic Date Due   Hepatitis B Vaccines (1 of 3 - 19+ 3-dose series) Never done   Cervical Cancer Screening (HPV/Pap Cotest)  02/16/2024     ROS: Per HPI, otherwise a complete review of systems was negative.   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 hercules;  s/p yag laser peripheral iridotomy left eye 2018;  bilateral eye 2019   Arthritis    neck   Complication of anesthesia    per pt had issues post op in PACU with excessive sleepiness and being sedated following sinus surgery , nurse had to to jaw thrust to maintain airway.  anesthesia record in care everywhere date of surgery 08-14-2022 @ [facility]  (in epic pt spoke with one of our anesthesiologist, Dr A. Hodierne MDA, note dated 12-28-2022)   Dry eye syndrome of both eyes    Family history of adverse reaction to anesthesia     father--- hard to wake   GERD (gastroesophageal reflux disease)    01-25-2023 per pt does not take any meds   History of hemolysis, elevated liver enzymes, and low platelet (HELLP) syndrome    History of MRSA infection    boils on skin (from shaving)   Hypertension    IDA (iron deficiency anemia)    Intracranial atherosclerosis    followed by pcp;  incidental finding on CT/ MRI   Menorrhagia    Multiple thyroid  nodules    incidental finding on head CT 2023/  ultrasound 05-11-2022 in [facility],  bilateral nodules , no bx , did not meet criteria   Nonrheumatic mitral (valve) insufficiency    Palpitations    cardiologist--- dr edison   Wears glasses    Patient Active Problem List   Diagnosis Date Noted   Thymoma 07/01/2023   Myasthenia gravis (HCC) 05/24/2023   Statin intolerance 11/14/2022   Dysmenorrhea 05/31/2022   Intracranial atherosclerosis 05/09/2022   Iron deficiency 05/09/2022   Adjustment disorder 03/14/2022   Dyslipidemia 02/28/2021   Other fatigue 10/05/2020   Hearing loss 09/19/2020   Lipoma 06/10/2020   Allergic rhinitis 11/09/2019   Insomnia 11/09/2019   Cold sore 11/09/2019   Osteoarthritis 11/09/2019   Eye abnormality 11/09/2019   Panic attacks 04/09/2019  Palpitation 02/17/2013   Hypertension 11/22/2010   Past Surgical History:  Procedure Laterality Date   CESAREAN SECTION  11/06/2009   per pt w/ general anesthesia   COLONOSCOPY WITH ESOPHAGOGASTRODUODENOSCOPY (EGD)  06/19/2022   dr beavers   DILATION AND CURETTAGE OF UTERUS  09/06/2008   @ [facility];   for POC   DILITATION & CURRETTAGE/HYSTROSCOPY WITH NOVASURE ABLATION N/A 02/13/2023   Procedure: DILATATION & CURETTAGE/HYSTEROSCOPY WITH NOVASURE ABLATION; MYOSURE RESECTION;  Surgeon: Gorge Ade, MD;  Location: [facility];  Service: Gynecology;  Laterality: N/A;   NASAL/SINUS ENDOSCOPY  08/14/2022   @ [facility] by dr d. loria;  bilateral resection subucous turbinates,   septoplasty,  right maxillary antrostomy w/ removal tissue   TONSILLECTOMY AND ADENOIDECTOMY  1986   TOTAL THYMECTOMY  09/25/2023   WISDOM TOOTH EXTRACTION  1994   per pt w/ anesthesia    Family History  Problem Relation Age of Onset   Hypertension Mother    Hyperlipidemia Mother    Diabetes Father    Hyperlipidemia Father    Hypertension Father    Kidney disease Father    Hypertension Brother    Cancer Maternal Grandmother    Cancer Maternal Grandfather    Cancer Paternal Grandmother    Heart disease Paternal Grandmother    Heart disease Paternal Grandfather    Hypertension Paternal Grandfather    Colon cancer Neg Hx    Esophageal cancer Neg Hx    Liver cancer Neg Hx    Pancreatic cancer Neg Hx    Rectal cancer Neg Hx    Stomach cancer Neg Hx     Medications- reviewed and updated Current Outpatient Medications  Medication Sig Dispense Refill   azelastine  (ASTELIN ) 0.1 % nasal spray Place 2 sprays into both nostrils 2 (two) times daily. (Patient taking differently: Place 2 sprays into both nostrils 2 (two) times daily as needed for allergies or rhinitis.) 30 mL 12   busPIRone  (BUSPAR ) 10 MG tablet TAKE 1 TABLET BY MOUTH EVERY MORNING AND 2 BY MOUTH EVERY NIGHT AT BEDTIME 90 tablet 2   calcium  citrate (CALCITRATE - DOSED IN MG ELEMENTAL CALCIUM ) 950 (200 Ca) MG tablet Take 2 tablets by mouth daily with lunch.     Coenzyme Q10-Omega 3 Fatty Acd (COQMAX OMEGA) 50 MG CAPS Take 2 capsules by mouth daily with lunch.     COLLAGEN PO Take by mouth daily.     ezetimibe  (ZETIA ) 10 MG tablet Take 1 tablet (10 mg total) by mouth daily. 90 tablet 3   Glycerin-Hypromellose-PEG 400 (DRY EYE RELIEF DROPS OP) Place 1 drop into both eyes 2 (two) times daily.     hydrocortisone  (ANUSOL -HC) 2.5 % rectal cream APPLY RECTALLY TO THE AFFECTED AREA THREE TIMES DAILY 30 g 1   metoprolol  succinate (TOPROL -XL) 50 MG 24 hr tablet TAKE 1 TABLET BY MOUTH TWICE DAILY WITH OR IMMEDIATELY FOLLOWING A MEAL  180 tablet 0   montelukast  (SINGULAIR ) 10 MG tablet Take 1 tablet (10 mg total) by mouth at bedtime. 30 tablet 3   Multiple Vitamin (MULTIVITAMIN) tablet Take 1 tablet by mouth daily with lunch.     mycophenolate  (CELLCEPT ) 500 MG tablet Take 1 tablet (500 mg total) by mouth 2 (two) times daily. 60 tablet 11   traZODone  (DESYREL ) 50 MG tablet TAKE 1/2 TO 1 TABLET(25 TO 50 MG) BY MOUTH AT BEDTIME AS NEEDED FOR SLEEP 30 tablet 3   valACYclovir  (VALTREX ) 500 MG tablet Take 1 tablet (500 mg total) by  mouth daily. 90 tablet 3   No current facility-administered medications for this visit.    Allergies-reviewed and updated Allergies  Allergen Reactions   Aspirin Nausea And Vomiting    can take coated aspirin   Augmentin [Amoxicillin -Pot Clavulanate] Diarrhea   Erythromycin Nausea And Vomiting   Levaquin [Levofloxacin] Other (See Comments)    Trigger thumb; arthralgias   Nitrofurantoin Nausea And Vomiting   Statins Other (See Comments)    Transient muscle weakness/ myalgias  Muscle weakness   Azithromycin Nausea And Vomiting, Palpitations and Other (See Comments)    made my heart speed up    Social History   Socioeconomic History   Marital status: Divorced    Spouse name: Anise Harbin   Number of children: 1   Years of education: Not on file   Highest education level: Master's degree (e.g., MA, MS, MEng, MEd, MSW, MBA)  Occupational History    Employer: [employer]  Tobacco Use   Smoking status: Never   Smokeless tobacco: Never  Vaping Use   Vaping status: Never Used  Substance and Sexual Activity   Alcohol use: Yes    Comment: occasional   Drug use: Never   Sexual activity: Yes    Birth control/protection: None  Other Topics Concern   Not on file  Social History Narrative   Lives with husband and daughter.    Social Drivers of Corporate investment banker Strain: Low Risk  (09/30/2023)   Received from [facility]   Overall Financial  Resource Strain (CARDIA)    Difficulty of Paying Living Expenses: Not hard at all  Food Insecurity: No Food Insecurity (09/30/2023)   Received from [facility]   Hunger Vital Sign    Within the past 12 months, you worried that your food would run out before you got the money to buy more.: Never true    Within the past 12 months, the food you bought just didn't last and you didn't have money to get more.: Never true  Transportation Needs: No Transportation Needs (09/30/2023)   Received from [facility] - Transportation    In the past 12 months, has lack of transportation kept you from medical appointments or from getting medications?: No    Lack of Transportation (Non-Medical): No  Physical Activity: Unknown (09/27/2023)   Exercise Vital Sign    Days of Exercise per Week: 0 days    Minutes of Exercise per Session: Not on file  Stress: No Stress Concern Present (09/27/2023)   [employer] of Occupational Health - Occupational Stress Questionnaire    Feeling of Stress : Only a little  Social Connections: Moderately Integrated (09/27/2023)   Social Connection and Isolation Panel    Frequency of Communication with Friends and Family: More than three times a week    Frequency of Social Gatherings with Friends and Family: More than three times a week    Attends Religious Services: More than 4 times per year    Active Member of [employer] or Organizations: Yes    Attends Engineer, structural: More than 4 times per year    Marital Status: Divorced        Objective:  Physical Exam: BP 100/66   Pulse 62   Temp (!) 97.2 F (36.2 C) (Temporal)   Ht 5' 3 (1.6 m)   Wt 164 lb 12.8 oz (74.8 kg)   SpO2 98%   BMI 29.19 kg/m  Body mass index is 29.19 kg/m. Wt Readings from Last 3 Encounters:  03/18/24 164 lb 12.8 oz (74.8 kg)  11/14/23 155 lb (70.3 kg)  11/12/23 156 lb 9.6 oz (71 kg)   Gen: NAD, resting comfortably HEENT: TMs normal  bilaterally. OP clear. No thyromegaly noted.  CV: RRR with no murmurs appreciated Pulm: NWOB, CTAB with no crackles, wheezes, or rhonchi GI: Normal bowel sounds present. Soft, Nontender, Nondistended. MSK: no edema, cyanosis, or clubbing noted Skin: warm, dry.  Well-healing 1 cm wound on right parietal scalp.  No surrounding erythema or drainage. Neuro: CN2-12 grossly intact. Strength 5/5 in upper and lower extremities. Reflexes symmetric and intact bilaterally.  Psych: Normal affect and thought content     Marissah Vandemark M. Kennyth, MD 03/18/2024 9:07 AM

## 2024-03-18 NOTE — Patient Instructions (Signed)
 It was very nice to see you today!    VISIT SUMMARY: Today, you had your annual physical exam. We discussed your ongoing management of myasthenia gravis, chronic rhinitis, and other health concerns. Blood work for cholesterol and glucose will be done, and we talked about the importance of the flu vaccine.  YOUR PLAN: MYASTHENIA GRAVIS STATUS POST THYMOMA RESECTION: Your myasthenia gravis remains well-controlled after your thymoma resection. You are currently on prednisone  and Cellcept , and you plan to taper the prednisone  after your next neurology visit. -Continue taking prednisone  and Cellcept  as prescribed. -Consider getting a flu vaccination due to respiratory risk.  CHRONIC RHINITIS: You have chronic nasal drainage and have been cautious about medications due to your myasthenia gravis. -Start using Singulair  and Flonase as they are safe for you. -Continue to avoid antihistamines.  IMPAIRED WOUND HEALING OF SCALP AFTER EXCISION: Your scalp wound is healing slowly, likely due to prednisone  use, but there are no signs of infection. -Continue current wound care management.  ENDOMETRIAL POLYP WITH THICKENED ENDOMETRIAL LINING: You have a thickened endometrial lining with a polyp, and you are considering treatment options. -Seek a second opinion from a gynecologist at [facility] for further management.   GENERAL HEALTH MAINTENANCE: Routine wellness and preventive care measures. -Blood work for cholesterol and glucose will be performed. -Discuss Pap smear with your gynecologist after resolving endometrial issues. -Consider getting a pneumonia vaccine next year. -Discuss the importance of flu vaccination to prevent respiratory complications related to myasthenia gravis.  Return in about 1 year (around 03/18/2025) for Annual Physical.   Take care, Dr Kennyth  PLEASE NOTE:  If you had any lab tests, please let us  know if you have not heard back within a few days. You may see your results on  mychart before we have a chance to review them but we will give you a call once they are reviewed by us .   If we ordered any referrals today, please let us  know if you have not heard from their office within the next week.   If you had any urgent prescriptions sent in today, please check with the pharmacy within an hour of our visit to make sure the prescription was transmitted appropriately.   Please try these tips to maintain a healthy lifestyle:  Eat at least 3 REAL meals and 1-2 snacks per day.  Aim for no more than 5 hours between eating.  If you eat breakfast, please do so within one hour of getting up.   Each meal should contain half fruits/vegetables, one quarter protein, and one quarter carbs (no bigger than a computer mouse)  Cut down on sweet beverages. This includes juice, soda, and sweet tea.   Drink at least 1 glass of water with each meal and aim for at least 8 glasses per day  Exercise at least 150 minutes every week.    Preventive Care 37-69 Years Old, Female Preventive care refers to lifestyle choices and visits with your health care provider that can promote health and wellness. Preventive care visits are also called wellness exams. What can I expect for my preventive care visit? Counseling Your health care provider may ask you questions about your: Medical history, including: Past medical problems. Family medical history. Pregnancy history. Current health, including: Menstrual cycle. Method of birth control. Emotional well-being. Home life and relationship well-being. Sexual activity and sexual health. Lifestyle, including: Alcohol, nicotine or tobacco, and drug use. Access to firearms. Diet, exercise, and sleep habits. Work and work Astronomer. Sunscreen  use. Safety issues such as seatbelt and bike helmet use. Physical exam Your health care provider will check your: Height and weight. These may be used to calculate your BMI (body mass index). BMI is a  measurement that tells if you are at a healthy weight. Waist circumference. This measures the distance around your waistline. This measurement also tells if you are at a healthy weight and may help predict your risk of certain diseases, such as type 2 diabetes and high blood pressure. Heart rate and blood pressure. Body temperature. Skin for abnormal spots. What immunizations do I need?  Vaccines are usually given at various ages, according to a schedule. Your health care provider will recommend vaccines for you based on your age, medical history, and lifestyle or other factors, such as travel or where you work. What tests do I need? Screening Your health care provider may recommend screening tests for certain conditions. This may include: Lipid and cholesterol levels. Diabetes screening. This is done by checking your blood sugar (glucose) after you have not eaten for a while (fasting). Pelvic exam and Pap test. Hepatitis B test. Hepatitis C test. HIV (human immunodeficiency virus) test. STI (sexually transmitted infection) testing, if you are at risk. Lung cancer screening. Colorectal cancer screening. Mammogram. Talk with your health care provider about when you should start having regular mammograms. This may depend on whether you have a family history of breast cancer. BRCA-related cancer screening. This may be done if you have a family history of breast, ovarian, tubal, or peritoneal cancers. Bone density scan. This is done to screen for osteoporosis. Talk with your health care provider about your test results, treatment options, and if necessary, the need for more tests. Follow these instructions at home: Eating and drinking  Eat a diet that includes fresh fruits and vegetables, whole grains, lean protein, and low-fat dairy products. Take vitamin and mineral supplements as recommended by your health care provider. Do not drink alcohol if: Your health care provider tells you not to  drink. You are pregnant, may be pregnant, or are planning to become pregnant. If you drink alcohol: Limit how much you have to 0-1 drink a day. Know how much alcohol is in your drink. In the [place] one drink equals one 12 oz bottle of beer (355 mL), one 5 oz glass of wine (148 mL), or one 1 oz glass of hard liquor (44 mL). Lifestyle Brush your teeth every morning and night with fluoride toothpaste. Floss one time each day. Exercise for at least 30 minutes 5 or more days each week. Do not use any products that contain nicotine or tobacco. These products include cigarettes, chewing tobacco, and vaping devices, such as e-cigarettes. If you need help quitting, ask your health care provider. Do not use drugs. If you are sexually active, practice safe sex. Use a condom or other form of protection to prevent STIs. If you do not wish to become pregnant, use a form of birth control. If you plan to become pregnant, see your health care provider for a prepregnancy visit. Take aspirin only as told by your health care provider. Make sure that you understand how much to take and what form to take. Work with your health care provider to find out whether it is safe and beneficial for you to take aspirin daily. Find healthy ways to manage stress, such as: Meditation, yoga, or listening to music. Journaling. Talking to a trusted person. Spending time with friends and family. Minimize exposure to UV  radiation to reduce your risk of skin cancer. Safety Always wear your seat belt while driving or riding in a vehicle. Do not drive: If you have been drinking alcohol. Do not ride with someone who has been drinking. When you are tired or distracted. While texting. If you have been using any mind-altering substances or drugs. Wear a helmet and other protective equipment during sports activities. If you have firearms in your house, make sure you follow all gun safety procedures. Seek help if you have been  physically or sexually abused. What's next? Visit your health care provider once a year for an annual wellness visit. Ask your health care provider how often you should have your eyes and teeth checked. Stay up to date on all vaccines. This information is not intended to replace advice given to you by your health care provider. Make sure you discuss any questions you have with your health care provider. Document Revised: 01/25/2021 Document Reviewed: 01/25/2021 Elsevier Patient Education  2024 [employer].

## 2024-03-18 NOTE — Assessment & Plan Note (Signed)
 Continue management per GYN.  They are considering repeat D&C versus starting progesterone.

## 2024-03-18 NOTE — Assessment & Plan Note (Signed)
 Symptoms have flared up recently.  We need to avoid antihistamines due to her myasthenia.  Recommended over-the-counter Flonase.  Will also send a prescription in for Singulair .  She will let us  know if not improving and would consider referral to allergist or ENT.

## 2024-03-19 ENCOUNTER — Encounter: Payer: Self-pay | Admitting: Family Medicine

## 2024-03-19 ENCOUNTER — Ambulatory Visit: Payer: Self-pay | Admitting: Family Medicine

## 2024-03-19 NOTE — Telephone Encounter (Signed)
**Note De-identified  [place]bright Obfuscation** Please advise 

## 2024-03-19 NOTE — Progress Notes (Signed)
 Great news! Labs are all stable. Do not need to make any changes to treatment plan at this time. She should continue to work on diet and exercise and we can recheck in a year.

## 2024-03-19 NOTE — Telephone Encounter (Signed)
 Ok to send in tessalon  200 mg take twice daily as needed for cough.

## 2024-03-20 MED ORDER — BENZONATATE 200 MG PO CAPS: 200.0000 mg | ORAL_CAPSULE | Freq: Two times a day (BID) | ORAL | 0 refills | Status: DC | PRN

## 2024-03-20 NOTE — Telephone Encounter (Signed)
 Rx sent to [facility]

## 2024-03-25 ENCOUNTER — Other Ambulatory Visit: Payer: Self-pay | Admitting: Cardiology

## 2024-04-09 ENCOUNTER — Encounter: Payer: Self-pay | Admitting: Family Medicine

## 2024-04-10 NOTE — Telephone Encounter (Signed)
 Information send to referral coordinator pool

## 2024-04-18 ENCOUNTER — Encounter: Payer: Self-pay | Admitting: Family Medicine

## 2024-04-20 ENCOUNTER — Other Ambulatory Visit: Payer: Self-pay | Admitting: *Deleted

## 2024-04-20 DIAGNOSIS — N946 Dysmenorrhea, unspecified: Secondary | ICD-10-CM

## 2024-04-20 NOTE — Telephone Encounter (Signed)
 Referral change to [facility]

## 2024-05-02 ENCOUNTER — Encounter: Payer: Self-pay | Admitting: Family Medicine

## 2024-05-04 ENCOUNTER — Other Ambulatory Visit: Payer: Self-pay | Admitting: *Deleted

## 2024-05-04 ENCOUNTER — Telehealth: Admitting: Family Medicine

## 2024-05-04 ENCOUNTER — Ambulatory Visit: Payer: Self-pay

## 2024-05-04 VITALS — Ht 63.0 in | Wt 164.0 lb

## 2024-05-04 DIAGNOSIS — G7000 Myasthenia gravis without (acute) exacerbation: Secondary | ICD-10-CM | POA: Diagnosis not present

## 2024-05-04 DIAGNOSIS — J309 Allergic rhinitis, unspecified: Secondary | ICD-10-CM | POA: Diagnosis not present

## 2024-05-04 MED ORDER — BUSPIRONE HCL 10 MG PO TABS: ORAL_TABLET | ORAL | 2 refills | Status: DC

## 2024-05-04 MED ORDER — AZELASTINE HCL 0.1 % NA SOLN: 2.0000 | Freq: Two times a day (BID) | NASAL | 12 refills | Status: AC

## 2024-05-04 MED ORDER — TRAZODONE HCL 50 MG PO TABS: ORAL_TABLET | ORAL | 3 refills | Status: DC

## 2024-05-04 MED ORDER — AMOXICILLIN 875 MG PO TABS: 875.0000 mg | ORAL_TABLET | Freq: Two times a day (BID) | ORAL | 0 refills | Status: AC

## 2024-05-04 NOTE — Telephone Encounter (Signed)
 Rx send to [facility] pharmacy

## 2024-05-04 NOTE — Assessment & Plan Note (Signed)
 May be contributing to above.  She is currently on Flonase and Singulair .  Will add on Astelin  as above.  She will let us  know if not improving.

## 2024-05-04 NOTE — Telephone Encounter (Signed)
 FYI Only or Action Required?: FYI only for provider.  Patient was last seen in primary care on 03/18/2024 by Kennyth Worth HERO, MD.  Called Nurse Triage reporting Cough.  Symptoms began several days ago.  Interventions attempted: Rest, hydration, or home remedies.  Symptoms are: gradually worsening.  Triage Disposition: See Physician Within 24 Hours  Patient/caregiver understands and will follow disposition?: Yes, will follow disposition  Copied from CRM #[id number]. Topic: Clinical - Red Word Triage >> May 04, 2024  8:14 AM Charlet HERO wrote: Red Word that prompted transfer to Nurse Triage: Patient is calling she has an automune disease and she is coughing up greenish/yellow mucus she is stating that her throat is scratchy as well started Friday. Dr. Kennyth Rose Beagle. Reason for Disposition  [1] Continuous (nonstop) coughing interferes with work or school AND [2] no improvement using cough treatment per Care Advice  Answer Assessment - Initial Assessment Questions 1. ONSET: When did the cough begin?      About 4 days 2. SEVERITY: How bad is the cough today?      moderate 3. SPUTUM: Describe the color of your sputum (e.g., none, dry cough; clear, white, yellow, green)     Yellow/green 4. HEMOPTYSIS: Are you coughing up any blood? If Yes, ask: How much? (e.g., flecks, streaks, tablespoons, etc.)     denies 5. DIFFICULTY BREATHING: Are you having difficulty breathing? If Yes, ask: How bad is it? (e.g., mild, moderate, severe)      denies 6. FEVER: Do you have a fever? If Yes, ask: What is your temperature, how was it measured, and when did it start?     denies 7. CARDIAC HISTORY: Do you have any history of heart disease? (e.g., heart attack, congestive heart failure)      denies 8. LUNG HISTORY: Do you have any history of lung disease?  (e.g., pulmonary embolus, asthma, emphysema)     denies 9. PE RISK FACTORS: Do you have a history of blood clots? (or: recent  major surgery, recent prolonged travel, bedridden)     denies 10. OTHER SYMPTOMS: Do you have any other symptoms? (e.g., runny nose, wheezing, chest pain)       Stuffy nose, scratchy throat,  11. PREGNANCY: Is there any chance you are pregnant? When was your last menstrual period?       denies  Protocols used: Cough - Acute Productive-A-AH

## 2024-05-04 NOTE — Assessment & Plan Note (Signed)
 Follows with [facility].  Not currently having any difficulty breathing or any signs of respiratory distress.  We are treating her URI as above.  She will continue the prednisone  and CellCept  per [facility] neurology.  We discussed reasons to return to care.

## 2024-05-04 NOTE — Progress Notes (Signed)
   Caroline Peterson is a 48 y.o. female who presents today for a virtual office visit.  Assessment/Plan:  New/Acute Problems: URI Discussed limitations of virtual visit and inability to perform physical exam.  Patient is at high risk for complication due to her comorbidities including myasthenia and immunocompromised due to immunosuppressive's.  We will start amoxicillin .  Also start Astelin  nasal spray.  She can use over-the-counter meds as needed as well.  We discussed reasons to return to care.  Chronic Problems Addressed Today: Myasthenia gravis (HCC) Follows with [facility].  Not currently having any difficulty breathing or any signs of respiratory distress.  We are treating her URI as above.  She will continue the prednisone  and CellCept  per [facility] neurology.  We discussed reasons to return to care.  Allergic rhinitis May be contributing to above.  She is currently on Flonase and Singulair .  Will add on Astelin  as above.  She will let us  know if not improving.     Subjective:  HPI:  See Assessment / plan for status of chronic conditions.   Discussed the use of AI scribe software for clinical note transcription with the patient, who gave verbal consent to proceed.  History of Present Illness Caroline Peterson is a 48 year old female with myasthenia gravis who presents with cough and congestion.  Her symptoms began 3 days ago with fatigue following a demanding week at work. She developed a cough and itchy throat, which progressed to producing yellowish-green sputum by this morning. She describes her throat as scratchy and reports congestion, though she is uncertain if it is in her chest. No fever or chills, but she feels very tired today.  She has not tested for COVID-19 but mentions a prevalent upper respiratory infection in her area, affecting many adults in her office. She has not experienced significant coughing and has not taken any cough  medicine.  Regarding her myasthenia gravis, she recently saw her neurologist who noted her left eye drooping by about 25%. Her prednisone  dose was decreased to 5 mg. She is prone to respiratory infections, having had two last year, and is immunocompromised due to her medications.  She is currently taking Singulair . She has a history of allergies to several antibiotics, including macrolides and quinolones, but can tolerate amoxicillin  without issues.         Objective/Observations  Physical Exam: Gen: NAD, resting comfortably Pulm: Normal work of breathing Neuro: Grossly normal, moves all extremities Psych: Normal affect and thought content  Virtual Visit via Video   I connected with Sabryn Preslar on 05/04/24 at 11:20 AM EDT by a video enabled telemedicine application and verified that I am speaking with the correct person using two identifiers. The limitations of evaluation and management by telemedicine and the availability of in person appointments were discussed. The patient expressed understanding and agreed to proceed.   Patient location: Patient's Work  Provider location: Adult nurse [employer] Persons participating in the virtual visit: Myself and Patient     Worth HERO. Kennyth, MD 05/04/2024 12:01 PM

## 2024-05-06 NOTE — Telephone Encounter (Signed)
 Pt already seen PCP

## 2024-05-31 ENCOUNTER — Other Ambulatory Visit: Payer: Self-pay | Admitting: Neurology

## 2024-06-01 ENCOUNTER — Encounter: Payer: Self-pay | Admitting: Family Medicine

## 2024-06-02 NOTE — Telephone Encounter (Signed)
**Note De-identified  [place]bright Obfuscation** Please advise 

## 2024-06-02 NOTE — Telephone Encounter (Signed)
 She can try breaking in half for a week or two then discontinuing though typically singulair  does not cause significant withdrawal symptoms.  Worth HERO. Kennyth, MD 06/02/2024 8:29 AM

## 2024-07-12 ENCOUNTER — Other Ambulatory Visit: Payer: Self-pay | Admitting: Family Medicine

## 2024-08-01 ENCOUNTER — Other Ambulatory Visit: Payer: Self-pay | Admitting: Family Medicine

## 2024-08-10 ENCOUNTER — Ambulatory Visit (HOSPITAL_BASED_OUTPATIENT_CLINIC_OR_DEPARTMENT_OTHER)
Admission: RE | Admit: 2024-08-10 | Discharge: 2024-08-10 | Disposition: A | Discharge status: Home / Self Care | Payer: Self-pay | Attending: Family Medicine

## 2024-08-10 ENCOUNTER — Encounter (HOSPITAL_BASED_OUTPATIENT_CLINIC_OR_DEPARTMENT_OTHER): Payer: Self-pay

## 2024-08-10 ENCOUNTER — Ambulatory Visit: Payer: Self-pay

## 2024-08-10 VITALS — BP 144/82 | HR 81 | Temp 97.9°F | Resp 18

## 2024-08-10 DIAGNOSIS — R21 Rash and other nonspecific skin eruption: Secondary | ICD-10-CM | POA: Diagnosis not present

## 2024-08-10 DIAGNOSIS — H9201 Otalgia, right ear: Secondary | ICD-10-CM | POA: Diagnosis not present

## 2024-08-10 MED ORDER — MUPIROCIN 2 % EX OINT: TOPICAL_OINTMENT | Freq: Two times a day (BID) | CUTANEOUS | 0 refills | Status: AC

## 2024-08-10 MED ORDER — CEPHALEXIN 500 MG PO CAPS: 500.0000 mg | ORAL_CAPSULE | Freq: Three times a day (TID) | ORAL | 0 refills | Status: AC

## 2024-08-10 NOTE — Telephone Encounter (Addendum)
 FYI Only or Action Required?: FYI only for provider: Advised UC today.  Patient was last seen in primary care on 05/04/2024 by Kennyth Worth HERO, MD.  Called Nurse Triage reporting Nose Problem, Hypertension, and Ear Problem.  Symptoms began yesterday.  Interventions attempted: OTC medications: Bactroban .  Symptoms are: gradually worsening.  Triage Disposition: See HCP Within 4 Hours (Or PCP Triage)  Patient/caregiver understands and will follow disposition?: Yes    Christmas eve onset of sneezing and cough after being around cats. Improved after not being around cats. Pts primary concern onset 5-6 itchy red bumps on outer left side of nose yesterday. Has had them for years, normally on the inside of nose. Dx with vestibulitis by ENT provider previously. Has hx of mysthenia gravis, concerned if it's not treated quickly it can flare her symptoms. No fever, pain or drainage. Whooshing sensation in right ear starting over the weekend. No pain currently or changes to hearing. Has had it for several years and fluctuates, exacerbated by current symptoms.   Also reports fluctuating BP. BP normally 120/80, this morning 146/83, HR 81 before BP meds. Yesterday SBP was fluctuating between 140 to 180. Takes metoprolol . No CP or SOB. Denies numbness, tingling, weakness, increased difficulty walking or changes to speech or vision. Speaking in clear full coherent continuous sentences. Advised appt today, no appts in pt region. Pt already had appt scheduled at UC she booked last night, advised to attend appt at [facility] today and to seek ED for worsening symptoms.     Copied from CRM #[id number]. Topic: Clinical - Red Word Triage >> Aug 10, 2024  9:57 AM Rea ORN wrote: Red Word that prompted transfer to Nurse Triage: sneezing, somewhat productive coughing, non colored mucus, runny nose, plugged up ears, bumps near nose (concerned with staph infection), BP fluctuating, reading last night 183/88, Reason for  Disposition  [1] Looks infected (e.g., spreading redness, pus) AND [2] diabetes mellitus or weak immune system (e.g., HIV positive, cancer chemo, splenectomy, organ transplant, chronic steroids)  Answer Assessment - Initial Assessment Questions 1. APPEARANCE of RASH: What does the rash look like? (e.g., blisters, dry flaky skin, red spots, redness, sores)     5-6 small red bumps  2. LOCATION: Where is the rash located?      Left side of nose  3. NUMBER: How many spots are there?      5-6   4. SIZE: How big are the spots? (e.g., inches, cm; or compare to size of pinhead, tip of pen, eraser, pea)      1/2 of a pencil eraser  5. ONSET: When did the rash start?      Yesterday  6. ITCHING: Does the rash itch? If Yes, ask: How bad is the itch?  (Scale 0-10; or none, mild, moderate, severe)     Severe  7. PAIN: Does the rash hurt? If Yes, ask: How bad is the pain?  (Scale 0-10; or none, mild, moderate, severe)     Denies  8. OTHER SYMPTOMS: Do you have any other symptoms? (e.g., fever)     Sneezing, cough, fluctuating BP, whooshing sensation in right ear  9. PREGNANCY: Is there any chance you are pregnant? When was your last menstrual period?     Denies  Protocols used: Rash or Redness - Localized-A-AH

## 2024-08-10 NOTE — Discharge Instructions (Addendum)
 Take the antibiotics as prescribed. This should cover the rash on your face and ears.  Recommend zyrtec and Flonase OTC for ear tube inflammation.  You can take motrin and tylenol  for pain.

## 2024-08-10 NOTE — ED Triage Notes (Signed)
 Runny nose  Headache Drainage  Sneezing Coughing started christmas eve Nasal vestibulitis I have myasthenia gravis

## 2024-08-10 NOTE — ED Provider Notes (Signed)
 " [facility]    CSN: [id number] Arrival date & time: 08/10/24  1422      History   Chief Complaint Chief Complaint  Patient presents with   Cough    HPI Caroline Peterson is a 48 y.o. female.   Patient is a 48 year old female with past medical history of myasthenia gravis.  Presents today with ear pain, congestion, drainage, sneezing, runny nose, rash.  This has been ongoing for a few weeks.  She has been using over-the-counter medications without much relief.  No fevers, chills, body aches or night sweats.   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 hercules;  s/p yag laser peripheral iridotomy left eye 2018;  bilateral eye 2019   Arthritis    neck   Complication of anesthesia    per pt had issues post op in PACU with excessive sleepiness and being sedated following sinus surgery , nurse had to to jaw thrust to maintain airway.  anesthesia record in care everywhere date of surgery 08-14-2022 @ [facility]  (in epic pt spoke with one of our anesthesiologist, Dr A. Hodierne MDA, note dated 12-28-2022)   Dry eye syndrome of both eyes    Family history of adverse reaction to anesthesia    father--- hard to wake   GERD (gastroesophageal reflux disease)    01-25-2023 per pt does not take any meds   History of hemolysis, elevated liver enzymes, and low platelet (HELLP) syndrome    History of MRSA infection    boils on skin (from shaving)   Hypertension    IDA (iron deficiency anemia)    Intracranial atherosclerosis    followed by pcp;  incidental finding on CT/ MRI   Menorrhagia    Multiple thyroid  nodules    incidental finding on head CT 2023/  ultrasound 05-11-2022 in [facility],  bilateral nodules , no bx , did not meet criteria   Nonrheumatic mitral (valve) insufficiency    Palpitations    cardiologist--- dr edison   Wears glasses     Patient Active Problem List    Diagnosis Date Noted   Thymoma 07/01/2023   Myasthenia gravis (HCC) 05/24/2023   Statin intolerance 11/14/2022   Dysmenorrhea 05/31/2022   Intracranial atherosclerosis 05/09/2022   Iron deficiency 05/09/2022   Adjustment disorder 03/14/2022   Dyslipidemia 02/28/2021   Other fatigue 10/05/2020   Hearing loss 09/19/2020   Lipoma 06/10/2020   Allergic rhinitis 11/09/2019   Insomnia 11/09/2019   Cold sore 11/09/2019   Osteoarthritis 11/09/2019   Eye abnormality 11/09/2019   Panic attacks 04/09/2019   Palpitation 02/17/2013   Hypertension 11/22/2010    Past Surgical History:  Procedure Laterality Date   CESAREAN SECTION  11/06/2009   per pt w/ general anesthesia   COLONOSCOPY WITH ESOPHAGOGASTRODUODENOSCOPY (EGD)  06/19/2022   dr beavers   DILATION AND CURETTAGE OF UTERUS  09/06/2008   @ [facility];   for POC   DILITATION & CURRETTAGE/HYSTROSCOPY WITH NOVASURE ABLATION N/A 02/13/2023   Procedure: DILATATION & CURETTAGE/HYSTEROSCOPY WITH NOVASURE ABLATION; MYOSURE RESECTION;  Surgeon: Gorge Ade, MD;  Location: [facility];  Service: Gynecology;  Laterality: N/A;   NASAL/SINUS ENDOSCOPY  08/14/2022   @ [facility] by dr d. loria;  bilateral resection subucous turbinates,  septoplasty,  right maxillary antrostomy w/ removal tissue   TONSILLECTOMY AND ADENOIDECTOMY  1986   TOTAL THYMECTOMY  09/25/2023  WISDOM TOOTH EXTRACTION  1994   per pt w/ anesthesia    OB History   No obstetric history on file.      Home Medications    Prior to Admission medications  Medication Sig Start Date End Date Taking? Authorizing Provider  azelastine  (ASTELIN ) 0.1 % nasal spray Place 2 sprays into both nostrils 2 (two) times daily. 05/04/24  Yes Kennyth Worth HERO, MD  busPIRone  (BUSPAR ) 10 MG tablet TAKE 1 TABLET BY MOUTH EVERY MORNING AND 2 BY MOUTH EVERY NIGHT AT BEDTIME 08/03/24  Yes Kennyth Worth HERO, MD  cephALEXin (KEFLEX) 500 MG capsule Take 1 capsule (500 mg total)  by mouth 3 (three) times daily for 7 days. 08/10/24 08/17/24 Yes Lalaine Overstreet A, FNP  Coenzyme Q10-Omega 3 Fatty Acd (COQMAX OMEGA) 50 MG CAPS Take 2 capsules by mouth daily with lunch.   Yes [provider]  COLLAGEN PO Take by mouth daily.   Yes [provider]  ezetimibe  (ZETIA ) 10 MG tablet Take 1 tablet (10 mg total) by mouth daily. 11/14/23  Yes Lavona Agent, MD  hydrocortisone  (ANUSOL -HC) 2.5 % rectal cream APPLY RECTALLY TO THE AFFECTED AREA THREE TIMES DAILY 10/05/22  Yes Zehr, Jessica D, PA-C  metoprolol  succinate (TOPROL -XL) 50 MG 24 hr tablet TAKE 1 TABLET BY MOUTH TWICE A DAY WITH OR IMMEDIATELY FOLLOWING A MEAL 03/25/24  Yes Lavona Agent, MD  montelukast  (SINGULAIR ) 10 MG tablet TAKE 1 TABLET BY MOUTH EVERYDAY AT BEDTIME 07/13/24  Yes Parker, Caleb M, MD  mupirocin  ointment (BACTROBAN ) 2 % Apply topically 2 (two) times daily for 7 days. 08/10/24 08/17/24 Yes Tihanna Goodson A, FNP  mycophenolate  (CELLCEPT ) 500 MG tablet Take 1 tablet (500 mg total) by mouth 2 (two) times daily. 11/25/23  Yes Onita Duos, MD  traZODone  (DESYREL ) 50 MG tablet TAKE 1/2 TO 1 TABLET(25 TO 50 MG) BY MOUTH AT BEDTIME AS NEEDED FOR SLEEP 05/04/24  Yes Kennyth Worth HERO, MD  valACYclovir  (VALTREX ) 500 MG tablet Take 1 tablet (500 mg total) by mouth daily. 03/18/24  Yes Kennyth Worth HERO, MD  benzonatate  (TESSALON ) 200 MG capsule Take 1 capsule (200 mg total) by mouth 2 (two) times daily as needed for cough. 03/20/24   Kennyth Worth HERO, MD  calcium  citrate (CALCITRATE - DOSED IN MG ELEMENTAL CALCIUM ) 950 (200 Ca) MG tablet Take 2 tablets by mouth daily with lunch.    [provider]  Glycerin-Hypromellose-PEG 400 (DRY EYE RELIEF DROPS OP) Place 1 drop into both eyes 2 (two) times daily.    [provider]  Multiple Vitamin (MULTIVITAMIN) tablet Take 1 tablet by mouth daily with lunch.    [provider]    Family History Family History  Problem Relation Age of Onset   Hypertension  Mother    Hyperlipidemia Mother    Diabetes Father    Hyperlipidemia Father    Hypertension Father    Kidney disease Father    Hypertension Brother    Cancer Maternal Grandmother    Cancer Maternal Grandfather    Cancer Paternal Grandmother    Heart disease Paternal Grandmother    Heart disease Paternal Grandfather    Hypertension Paternal Grandfather    Colon cancer Neg Hx    Esophageal cancer Neg Hx    Liver cancer Neg Hx    Pancreatic cancer Neg Hx    Rectal cancer Neg Hx    Stomach cancer Neg Hx     Social History Social History[1]   Allergies  Aspirin, Augmentin [amoxicillin -pot clavulanate], Erythromycin, Levaquin [levofloxacin], Nitrofurantoin, Statins, and Azithromycin   Review of Systems Review of Systems  Respiratory:  Positive for cough.      Physical Exam Triage Vital Signs ED Triage Vitals  Encounter Vitals Group     BP 08/10/24 1455 (!) 144/82     Girls Systolic BP Percentile --      Girls Diastolic BP Percentile --      Boys Systolic BP Percentile --      Boys Diastolic BP Percentile --      Pulse Rate 08/10/24 1455 81     Resp 08/10/24 1455 18     Temp 08/10/24 1455 97.9 F (36.6 C)     Temp Source 08/10/24 1455 Oral     SpO2 08/10/24 1455 99 %     Weight --      Height --      Head Circumference --      Peak Flow --      Pain Score 08/10/24 1454 0     Pain Loc --      Pain Education --      Exclude from Growth Chart --    No data found.  Updated Vital Signs BP (!) 144/82 (BP Location: Right Arm)   Pulse 81   Temp 97.9 F (36.6 C) (Oral)   Resp 18   LMP 06/18/2024   SpO2 99%   Visual Acuity Right Eye Distance:   Left Eye Distance:   Bilateral Distance:    Right Eye Near:   Left Eye Near:    Bilateral Near:     Physical Exam Vitals and nursing note reviewed.  Constitutional:      General: She is not in acute distress.    Appearance: Normal appearance. She is not ill-appearing, toxic-appearing or diaphoretic.  HENT:      Head: Normocephalic and atraumatic.     Right Ear: Ear canal normal. A middle ear effusion is present. Tympanic membrane is injected.     Left Ear: Ear canal normal. There is impacted cerumen. Tympanic membrane is injected.     Nose: Congestion present.     Mouth/Throat:     Pharynx: Oropharynx is clear.  Eyes:     Conjunctiva/sclera: Conjunctivae normal.  Cardiovascular:     Rate and Rhythm: Normal rate and regular rhythm.     Pulses: Normal pulses.     Heart sounds: Normal heart sounds.  Pulmonary:     Effort: Pulmonary effort is normal.     Breath sounds: Normal breath sounds.  Skin:    General: Skin is warm and dry.     Findings: Rash present.     Comments: Papular erythematous rash to base of nose.   Neurological:     Mental Status: She is alert.  Psychiatric:        Mood and Affect: Mood normal.      UC Treatments / Results  Labs (all labs ordered are listed, but only abnormal results are displayed) Labs Reviewed - No data to display  EKG   Radiology No results found.  Procedures Procedures (including critical care time)  Medications Ordered in UC Medications - No data to display  Initial Impression / Assessment and Plan / UC Course  I have reviewed the triage vital signs and the nursing notes.  Pertinent labs & imaging results that were available during my care of the patient were reviewed by me and considered in my medical decision making (see chart for  details).     Right ear pain with rash ans sinus congestion-believe the right ear pain is due to eustachian tube dysfunction.  Recommended Flonase and Zyrtec.  Prescribing Keflex for possible staph infection to base of nose.  She has had this similar in the past.  She has been using Bactroban .  Recommended continue Bactroban  and start Keflex.  She is very limited on what she can take based on her myasthenia gravis. She can do Motrin and Tylenol  for pain. Final Clinical Impressions(s) / UC Diagnoses    Final diagnoses:  Right ear pain  Rash     Discharge Instructions      Take the antibiotics as prescribed. This should cover the rash on your face and ears.  Recommend zyrtec and Flonase OTC for ear tube inflammation.  You can take motrin and tylenol  for pain.      ED Prescriptions     Medication Sig Dispense Auth. Provider   cephALEXin (KEFLEX) 500 MG capsule Take 1 capsule (500 mg total) by mouth 3 (three) times daily for 7 days. 21 capsule Neelie Welshans A, FNP   mupirocin  ointment (BACTROBAN ) 2 % Apply topically 2 (two) times daily for 7 days. 22 g Adah Corning A, FNP      PDMP not reviewed this encounter.     [1]  Social History Tobacco Use   Smoking status: Never   Smokeless tobacco: Never  Vaping Use   Vaping status: Never Used  Substance Use Topics   Alcohol use: Yes    Comment: occasional   Drug use: Never     Adah Corning LABOR, FNP 08/11/24 [phone]  "

## 2024-08-10 NOTE — Telephone Encounter (Signed)
 noted

## 2024-08-17 ENCOUNTER — Telehealth: Payer: Self-pay | Admitting: Cardiology

## 2024-08-17 NOTE — Telephone Encounter (Signed)
 Spoke with pt, she developed a virus and has noticed a whooshing sound in her right ear. She checked her blood pressure to see if it was related and noticed her blood -pressure was running high.160/69-183/89. She took her last dose of kelfex today but the whooshing sound continues and it is making her crazy. She is taking the metoprolol  50 mg twice daily. Follow up scheduled and she knows top bring blood pressure machine to that appointment. She will also contact her medical doctor and cancel the follow up with us  if medical doctor thinks it is not needed.

## 2024-08-17 NOTE — Telephone Encounter (Signed)
 Pt c/o BP issue: STAT if pt c/o blurred vision, one-sided weakness or slurred speech.  STAT if BP is GREATER than 180/120 TODAY.  STAT if BP is LESS than 90/60 and SYMPTOMATIC TODAY  1. What is your BP concern? BP too high  2. Have you taken any BP medication today? Yes  3. What are your last 5 BP readings? 140/163 160/65 163/79 (Today)  4. Are you having any other symptoms (ex. Dizziness, headache, blurred vision, passed out)? Ear whistling

## 2024-08-18 ENCOUNTER — Ambulatory Visit: Payer: Self-pay

## 2024-08-18 NOTE — Telephone Encounter (Signed)
 Appt today

## 2024-08-18 NOTE — Telephone Encounter (Signed)
 FYI Only or Action Required?: Action required by provider: clinical question for provider and update on patient condition.  Patient was last seen in primary care on 05/04/2024 by Kennyth Worth HERO, MD.  Called Nurse Triage reporting Otalgia and Hypertension.  Symptoms began a week ago.  Interventions attempted: Prescription medications: keflex ; ear drops, Rest, hydration, or home remedies, and Ice/heat application.  Symptoms are: gradually worsening.  Triage Disposition: See Physician Within 24 Hours  Patient/caregiver understands and will follow disposition?: Yes     Copied from CRM [id number]. Topic: Clinical - Red Word Triage >> Aug 18, 2024 11:15 AM Drema MATSU wrote: Red Word that prompted transfer to Nurse Triage: Patient has whooshing in ear and pressure on face. Sharp pain behind, on your face, and headache by eyebrow. She states that it has been going on for about a week in a half.  BP 135/73 And now 150/97   Reason for Disposition  [1] Taking antibiotic > 72 hours (3 days) and [2] pain persists or recurs  Answer Assessment - Initial Assessment Questions Pt reports whooshing in R ear, with fullness and 2/10 pain. Pt went to [facility] 12/29 and has completed Keflex  at this time. Pt reports whooshing is still present and now her BP is elevated. Pt states that her BP is usually 120-130s/80s but with ear symptoms, pt has noticed increase in BP. Pt states this morning when she woke up, BP 135/73 and now 150/97. Pt states she did reach out to her Cardiologist who recommended pt take her BP medication, metoprolol  ER 50mg  BID, then 2h later recheck BP. Pt reported she does have a lot of medical anxiety and anxiety in general but she feels like she cannot stop thinking about her BP d/t ear whooshing. Pt reports she was able to get some rest last night by playing brown noise and was feeling better this morning until whooshing returned while she was at work. Pt denies any hearing loss, no drainage, no  fever, no issues with balance. Pt does have appt 08/19/24 but wasn't sure if there was anything else she could do for BP or ear until appt.    1. ANTIBIOTIC: What antibiotic are you taking? How many times per day?     Keflex ; completed at this time   2. ONSET: When was the antibiotic started?     12/29   3. LOCATION: Which ear is involved?     R ear   4. PAIN: How bad is the pain?   (Scale 0-10; none, mild, moderate or severe)     2/10  5. FEVER: Do you have a fever? If Yes, ask: What is your temperature, how was it measured, and when did it start?     None   6. DISCHARGE: Is there any discharge? If Yes, ask: What color is it? (e.g., clear, white; yellow, green; bloody)     No drainage; pt reports ear feels full   7. OTHER SYMPTOMS: Do you have any other symptoms? (e.g., headache, stiff neck, dizziness, vomiting, runny nose)     H/a, elevated BP  Protocols used: Ear - Otitis Media Follow-up Call-A-AH

## 2024-08-19 ENCOUNTER — Encounter: Payer: Self-pay | Admitting: Family Medicine

## 2024-08-19 ENCOUNTER — Ambulatory Visit: Admitting: Family Medicine

## 2024-08-19 VITALS — BP 118/78 | HR 77 | Temp 98.1°F | Ht 63.0 in | Wt 168.6 lb

## 2024-08-19 DIAGNOSIS — H9311 Tinnitus, right ear: Secondary | ICD-10-CM | POA: Diagnosis not present

## 2024-08-19 DIAGNOSIS — I159 Secondary hypertension, unspecified: Secondary | ICD-10-CM

## 2024-08-19 DIAGNOSIS — H9201 Otalgia, right ear: Secondary | ICD-10-CM | POA: Diagnosis not present

## 2024-08-19 DIAGNOSIS — J309 Allergic rhinitis, unspecified: Secondary | ICD-10-CM | POA: Diagnosis not present

## 2024-08-19 MED ORDER — CIPROFLOXACIN-DEXAMETHASONE 0.3-0.1 % OT SUSP: 4.0000 [drp] | Freq: Two times a day (BID) | OTIC | 0 refills | Status: AC

## 2024-08-19 NOTE — Progress Notes (Signed)
 "  Caroline Peterson is a 49 y.o. female who presents today for an office visit.  Assessment/Plan:  New/Acute Problems: Pulsatile Tinnitus / Otalgia  Patient with right ear pain and pulsatile tinnitus for the last week or so in setting of recent viral URI.  Overall her exam today is reassuring with any signs of otitis media.  No other red flag signs or symptoms.  Reassuring neurologic exam.  No carotid bruits on exam today.  We discussed potential etiologies for her pulsatile tinnitus though it does to be related to her recent viral URI.  She potentially has a history of eustachian tube dysfunction which may be flaring up as well.  We did discuss treatment options including watchful waiting versus trial of prednisone  versus having her follow back up with ENT.  She would like to avoid prednisone  if possible due to previous interactions.  She is having a small amount of otalgia and request eardrops for this.  Will try Ciprodex  though discussed with patient not sure if this will significantly improve her pulsatile tinnitus.  She has already been established with ENT and advised patient to follow-up with them soon if not improving over the next couple of days.  We discussed reasons to return to care.  Follow-up as needed.  Chronic Problems Addressed Today: Hypertension She has had a few intermittent elevations though at goal today.  May be related to her recent viral URI.  She will continue metoprolol  succinate 50 mg daily and monitor at home.  She will let us  know if persistently elevated.  Allergic rhinitis Probably contributing to above otalgia and pulsatile tinnitus.  She can continue her Astelin , Flonase, and Singulair .  She will follow-up with ENT if not improving as above.     Subjective:  HPI:  See assessment / plan for status of chronic conditions.   Discussed the use of AI scribe software for clinical note transcription with the patient, who gave verbal consent to  proceed.  History of Present Illness Caroline Peterson is a 49 year old female who presents with a persistent whooshing sound in her ears.  She has been experiencing a persistent whooshing sound in her ears for approximately two weeks, described as 'static in my ear constantly', which worsens when lying down. It occasionally subsides but is generally persistent. She also hears the pounding of her heartbeat in her ears. There is a history of decreased hearing, which has worsened recently, requiring her to ask people to repeat themselves. No dizziness or headache.  No vision changes.. Occasional sharp pain in her eardrum and a feeling of fullness in her ears, particularly the right ear, which is hard to pop.  Her blood pressure has been fluctuating, with readings as high as 183/87 and as low as 137/75. The whooshing sound becomes more pronounced when her blood pressure is higher. She has been using a noise machine with brown noise to aid sleep, which has helped improve her blood pressure.  She experienced congestion, runny nose, and sneezing, which led her to visit urgent care on December 29th, where she was prescribed Keflex . During this time, her blood pressure ranged from 140 to 160 systolic.         Objective:  Physical Exam: BP 118/78   Pulse 77   Temp 98.1 F (36.7 C) (Temporal)   Ht 5' 3 (1.6 m)   Wt 168 lb 9.6 oz (76.5 kg)   LMP 07/30/2024   SpO2 99%   BMI 29.87 kg/m  Gen: No acute distress, resting comfortably HEENT: TMs clear bilaterally. CV: Regular rate and rhythm with no murmurs appreciated.  No carotid bruits Pulm: Normal work of breathing, clear to auscultation bilaterally with no crackles, wheezes, or rhonchi Neuro: Cranial nerves II through XII intact.  Finger-nose-finger testing intact bilaterally. Psych: Normal affect and thought content      Makaylee Spielberg M. Kennyth, MD 08/19/2024 10:56 AM  "

## 2024-08-19 NOTE — Patient Instructions (Signed)
 It was very nice to see you today!  VISIT SUMMARY: You visited us  today because of a persistent whooshing sound in your ears, which has been ongoing for about two weeks. You also reported hearing your heartbeat in your ears, decreased hearing, and occasional sharp pain and fullness in your right ear. Your blood pressure has been fluctuating, and you have a history of autosclerosis and intracranial atherosclerosis.  YOUR PLAN: ACUTE RIGHT EAR INFLAMMATION WITH HEARING LOSS AND TINNITUS: Your persistent tinnitus and hearing loss are likely due to inflammation from a recent upper respiratory infection, with fluid present behind your eardrum. -You do not need antibiotics at this time. -We have prescribed Ciprodex  eardrops for you. -Avoid using prednisone  due to potential side effects. -If there is no improvement in a week, please follow up with an ENT specialist.  SECONDARY HYPERTENSION: Your recent high blood pressure readings are likely related to your acute illness but are currently well-controlled. -Continue monitoring your blood pressure at home.  Return if symptoms worsen or fail to improve.   Take care, Dr Kennyth  PLEASE NOTE:  If you had any lab tests, please let us  know if you have not heard back within a few days. You may see your results on mychart before we have a chance to review them but we will give you a call once they are reviewed by us .   If we ordered any referrals today, please let us  know if you have not heard from their office within the next week.   If you had any urgent prescriptions sent in today, please check with the pharmacy within an hour of our visit to make sure the prescription was transmitted appropriately.   Please try these tips to maintain a healthy lifestyle:  Eat at least 3 REAL meals and 1-2 snacks per day.  Aim for no more than 5 hours between eating.  If you eat breakfast, please do so within one hour of getting up.   Each meal should contain half  fruits/vegetables, one quarter protein, and one quarter carbs (no bigger than a computer mouse)  Cut down on sweet beverages. This includes juice, soda, and sweet tea.   Drink at least 1 glass of water with each meal and aim for at least 8 glasses per day  Exercise at least 150 minutes every week.

## 2024-08-19 NOTE — Assessment & Plan Note (Signed)
 She has had a few intermittent elevations though at goal today.  May be related to her recent viral URI.  She will continue metoprolol  succinate 50 mg daily and monitor at home.  She will let us  know if persistently elevated.

## 2024-08-19 NOTE — Assessment & Plan Note (Signed)
 Probably contributing to above otalgia and pulsatile tinnitus.  She can continue her Astelin , Flonase, and Singulair .  She will follow-up with ENT if not improving as above.

## 2024-08-21 ENCOUNTER — Ambulatory Visit: Admitting: Cardiology

## 2024-08-27 ENCOUNTER — Ambulatory Visit: Admission: EM | Admit: 2024-08-27 | Discharge: 2024-08-27 | Disposition: A | Discharge status: Home / Self Care

## 2024-08-27 ENCOUNTER — Encounter: Payer: Self-pay | Admitting: *Deleted

## 2024-08-27 DIAGNOSIS — I10 Essential (primary) hypertension: Secondary | ICD-10-CM

## 2024-08-27 NOTE — ED Triage Notes (Addendum)
 Pt reports her BP was elevated today: first reading 159/89, 2nd reading 175/101. States she had taken her BP meds. Denies symptoms other than pounding in my ears. Yesterday she was at a doctor appointment and it was 128/73. States her BP has been up and down since Christmas

## 2024-08-27 NOTE — ED Provider Notes (Signed)
 " [facility]    CSN: [id number] Arrival date & time: 08/27/24  1446      History   Chief Complaint Chief Complaint  Patient presents with   Hypertension    HPI Caroline Peterson is a 49 y.o. female.   Pt presents today due to sporadically increased BP since December. Pt states that it has been going up and down. Pt states that she is taking her BP meds as directed and most of her readings are usually less than 140/90. Pt states that she has recently started Progesterone, is getting married this year, and states that her grandmother is in the process of dying. Pt admits that she is also more anxious and wonders if she should increase her anxiety medication. Pt's provider has told her that she can increase her anxiety about 1 year ago but states that she never did.   The history is provided by the patient.  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 hercules;  s/p yag laser peripheral iridotomy left eye 2018;  bilateral eye 2019   Arthritis    neck   Complication of anesthesia    per pt had issues post op in PACU with excessive sleepiness and being sedated following sinus surgery , nurse had to to jaw thrust to maintain airway.  anesthesia record in care everywhere date of surgery 08-14-2022 @ [facility]  (in epic pt spoke with one of our anesthesiologist, Dr A. Hodierne MDA, note dated 12-28-2022)   Dry eye syndrome of both eyes    Family history of adverse reaction to anesthesia    father--- hard to wake   GERD (gastroesophageal reflux disease)    01-25-2023 per pt does not take any meds   History of hemolysis, elevated liver enzymes, and low platelet (HELLP) syndrome    History of MRSA infection    boils on skin (from shaving)   Hypertension    IDA (iron deficiency anemia)    Intracranial atherosclerosis    followed by pcp;  incidental finding on CT/ MRI    Menorrhagia    Multiple thyroid  nodules    incidental finding on head CT 2023/  ultrasound 05-11-2022 in [facility],  bilateral nodules , no bx , did not meet criteria   Nonrheumatic mitral (valve) insufficiency    Palpitations    cardiologist--- dr edison   Wears glasses     Patient Active Problem List   Diagnosis Date Noted   Thymoma 07/01/2023   Myasthenia gravis (HCC) 05/24/2023   Statin intolerance 11/14/2022   Dysmenorrhea 05/31/2022   Intracranial atherosclerosis 05/09/2022   Iron deficiency 05/09/2022   Adjustment disorder 03/14/2022   Dyslipidemia 02/28/2021   Other fatigue 10/05/2020   Hearing loss 09/19/2020   Lipoma 06/10/2020   Allergic rhinitis 11/09/2019   Insomnia 11/09/2019   Cold sore 11/09/2019   Osteoarthritis 11/09/2019   Eye abnormality 11/09/2019   Panic attacks 04/09/2019   Palpitation 02/17/2013   Hypertension 11/22/2010    Past Surgical History:  Procedure Laterality Date   CESAREAN SECTION  11/06/2009   per pt w/ general anesthesia   COLONOSCOPY WITH ESOPHAGOGASTRODUODENOSCOPY (EGD)  06/19/2022   dr beavers   DILATION AND CURETTAGE OF UTERUS  09/06/2008   @ [facility];   for POC   DILITATION & CURRETTAGE/HYSTROSCOPY WITH NOVASURE ABLATION N/A 02/13/2023   Procedure: DILATATION & CURETTAGE/HYSTEROSCOPY WITH NOVASURE ABLATION; MYOSURE  RESECTION;  Surgeon: Gorge Ade, MD;  Location: [facility];  Service: Gynecology;  Laterality: N/A;   NASAL/SINUS ENDOSCOPY  08/14/2022   @ [facility] by dr d. loria;  bilateral resection subucous turbinates,  septoplasty,  right maxillary antrostomy w/ removal tissue   TONSILLECTOMY AND ADENOIDECTOMY  1986   TOTAL THYMECTOMY  09/25/2023   WISDOM TOOTH EXTRACTION  1994   per pt w/ anesthesia    OB History   No obstetric history on file.      Home Medications    Prior to Admission medications  Medication Sig Start Date End Date Taking? Authorizing Provider  azelastine  (ASTELIN ) 0.1  % nasal spray Place 2 sprays into both nostrils 2 (two) times daily. 05/04/24  Yes Kennyth Worth HERO, MD  busPIRone  (BUSPAR ) 10 MG tablet TAKE 1 TABLET BY MOUTH EVERY MORNING AND 2 BY MOUTH EVERY NIGHT AT BEDTIME 08/03/24  Yes Kennyth Worth HERO, MD  calcium  citrate (CALCITRATE - DOSED IN MG ELEMENTAL CALCIUM ) 950 (200 Ca) MG tablet Take 2 tablets by mouth daily with lunch.   Yes [provider]  Coenzyme Q10-Omega 3 Fatty Acd (COQMAX OMEGA) 50 MG CAPS Take 2 capsules by mouth daily with lunch.   Yes [provider]  COLLAGEN PO Take by mouth daily.   Yes [provider]  Drospirenone (SLYND) 4 MG TABS Take 1 tablet by mouth daily. 08/15/24  Yes [provider]  ezetimibe  (ZETIA ) 10 MG tablet Take 1 tablet (10 mg total) by mouth daily. 11/14/23  Yes Lavona Agent, MD  Glycerin-Hypromellose-PEG 400 (DRY EYE RELIEF DROPS OP) Place 1 drop into both eyes 2 (two) times daily.   Yes [provider]  metoprolol  succinate (TOPROL -XL) 50 MG 24 hr tablet TAKE 1 TABLET BY MOUTH TWICE A DAY WITH OR IMMEDIATELY FOLLOWING A MEAL 03/25/24  Yes Lavona Agent, MD  montelukast  (SINGULAIR ) 10 MG tablet TAKE 1 TABLET BY MOUTH EVERYDAY AT BEDTIME 07/13/24  Yes Parker, Caleb M, MD  Multiple Vitamin (MULTIVITAMIN) tablet Take 1 tablet by mouth daily with lunch.   Yes [provider]  mycophenolate  (CELLCEPT ) 500 MG tablet Take 1 tablet (500 mg total) by mouth 2 (two) times daily. 11/25/23  Yes Onita Duos, MD  predniSONE  (DELTASONE ) 2.5 MG tablet Take 2.5 mg by mouth. 08/26/24 08/21/25 Yes [provider]  traZODone  (DESYREL ) 50 MG tablet TAKE 1/2 TO 1 TABLET(25 TO 50 MG) BY MOUTH AT BEDTIME AS NEEDED FOR SLEEP 05/04/24  Yes Kennyth Worth HERO, MD  valACYclovir  (VALTREX ) 500 MG tablet Take 1 tablet (500 mg total) by mouth daily. 03/18/24  Yes Kennyth Worth HERO, MD  ciprofloxacin -dexamethasone  (CIPRODEX ) OTIC suspension Place 4 drops into the right ear 2 (two) times daily. Patient  not taking: Reported on 08/27/2024 08/19/24   Kennyth Worth HERO, MD    Family History Family History  Problem Relation Age of Onset   Hypertension Mother    Hyperlipidemia Mother    Diabetes Father    Hyperlipidemia Father    Hypertension Father    Kidney disease Father    Hypertension Brother    Cancer Maternal Grandmother    Cancer Maternal Grandfather    Cancer Paternal Grandmother    Heart disease Paternal Grandmother    Heart disease Paternal Grandfather    Hypertension Paternal Grandfather    Colon cancer Neg Hx    Esophageal cancer Neg Hx    Liver cancer Neg Hx    Pancreatic cancer Neg Hx    Rectal  cancer Neg Hx    Stomach cancer Neg Hx     Social History Social History[1]   Allergies   Aspirin, Augmentin [amoxicillin -pot clavulanate], Erythromycin, Levaquin [levofloxacin], Nitrofurantoin, Statins, and Azithromycin   Review of Systems Review of Systems   Physical Exam Triage Vital Signs ED Triage Vitals  Encounter Vitals Group     BP 08/27/24 1515 138/85     Girls Systolic BP Percentile --      Girls Diastolic BP Percentile --      Boys Systolic BP Percentile --      Boys Diastolic BP Percentile --      Pulse Rate 08/27/24 1515 90     Resp 08/27/24 1515 16     Temp 08/27/24 1515 98.2 F (36.8 C)     Temp Source 08/27/24 1515 Oral     SpO2 08/27/24 1515 99 %     Weight --      Height --      Head Circumference --      Peak Flow --      Pain Score 08/27/24 1510 0     Pain Loc --      Pain Education --      Exclude from Growth Chart --    No data found.  Updated Vital Signs BP 138/85 (BP Location: Left Arm)   Pulse 90   Temp 98.2 F (36.8 C) (Oral)   Resp 16   LMP 07/30/2024   SpO2 99%   Visual Acuity Right Eye Distance:   Left Eye Distance:   Bilateral Distance:    Right Eye Near:   Left Eye Near:    Bilateral Near:     Physical Exam Vitals and nursing note reviewed.  Constitutional:      General: She is not in acute distress.     Appearance: Normal appearance. She is not ill-appearing, toxic-appearing or diaphoretic.  Eyes:     General: No scleral icterus. Cardiovascular:     Rate and Rhythm: Normal rate and regular rhythm.     Heart sounds: Normal heart sounds.  Pulmonary:     Effort: Pulmonary effort is normal. No respiratory distress.     Breath sounds: Normal breath sounds. No wheezing or rhonchi.  Skin:    General: Skin is warm.  Neurological:     Mental Status: She is alert and oriented to person, place, and time.  Psychiatric:        Mood and Affect: Mood normal.        Behavior: Behavior normal.      UC Treatments / Results  Labs (all labs ordered are listed, but only abnormal results are displayed) Labs Reviewed - No data to display  EKG   Radiology No results found.  Procedures Procedures (including critical care time)  Medications Ordered in UC Medications - No data to display  Initial Impression / Assessment and Plan / UC Course  I have reviewed the triage vital signs and the nursing notes.  Pertinent labs & imaging results that were available during my care of the patient were reviewed by me and considered in my medical decision making (see chart for details).    Final Clinical Impressions(s) / UC Diagnoses   Final diagnoses:  Essential hypertension     Discharge Instructions      Not concerned about BP, only concerned if BP is consistently over 140/90. Please increase Buspar  to 20 mg in the am. If anxiety persists speak to PCP about increasing night time  dose too.    ED Prescriptions   None    PDMP not reviewed this encounter.    [1]  Social History Tobacco Use   Smoking status: Never   Smokeless tobacco: Never  Vaping Use   Vaping status: Never Used  Substance Use Topics   Alcohol use: Yes    Comment: occasional   Drug use: Never     Andra Corean BROCKS, PA-C 08/27/24 1616  "

## 2024-08-27 NOTE — Discharge Instructions (Addendum)
 Not concerned about BP, only concerned if BP is consistently over 140/90. Please increase Buspar  to 20 mg in the am. If anxiety persists speak to PCP about increasing night time dose too.

## 2024-09-02 ENCOUNTER — Other Ambulatory Visit: Payer: Self-pay | Admitting: Family Medicine

## 2025-03-23 ENCOUNTER — Encounter: Admitting: Family Medicine
